# Patient Record
Sex: Female | Born: 1960 | Race: White | Hispanic: No | Smoking: Current every day smoker
Health system: Southern US, Community
[De-identification: ages and names within clinical notes are randomized; demographics above are authoritative.]

## PROBLEM LIST (undated history)

## (undated) DIAGNOSIS — F1410 Cocaine abuse, uncomplicated: Secondary | ICD-10-CM

## (undated) DIAGNOSIS — F329 Major depressive disorder, single episode, unspecified: Secondary | ICD-10-CM

## (undated) DIAGNOSIS — E119 Type 2 diabetes mellitus without complications: Secondary | ICD-10-CM

## (undated) DIAGNOSIS — IMO0001 Reserved for inherently not codable concepts without codable children: Secondary | ICD-10-CM

## (undated) DIAGNOSIS — E785 Hyperlipidemia, unspecified: Secondary | ICD-10-CM

## (undated) DIAGNOSIS — I2510 Atherosclerotic heart disease of native coronary artery without angina pectoris: Secondary | ICD-10-CM

## (undated) DIAGNOSIS — L02511 Cutaneous abscess of right hand: Secondary | ICD-10-CM

## (undated) DIAGNOSIS — Z9119 Patient's noncompliance with other medical treatment and regimen: Secondary | ICD-10-CM

## (undated) DIAGNOSIS — Z794 Long term (current) use of insulin: Secondary | ICD-10-CM

## (undated) DIAGNOSIS — R45851 Suicidal ideations: Secondary | ICD-10-CM

## (undated) DIAGNOSIS — I10 Essential (primary) hypertension: Secondary | ICD-10-CM

## (undated) DIAGNOSIS — E669 Obesity, unspecified: Secondary | ICD-10-CM

## (undated) DIAGNOSIS — Z91199 Patient's noncompliance with other medical treatment and regimen due to unspecified reason: Secondary | ICD-10-CM

## (undated) HISTORY — PX: ABDOMINAL HYSTERECTOMY: SHX81

## (undated) HISTORY — PX: APPENDECTOMY: SHX54

## (undated) HISTORY — PX: CHOLECYSTECTOMY: SHX55

---

## 2000-12-24 ENCOUNTER — Emergency Department (HOSPITAL_COMMUNITY): Admission: EM | Admit: 2000-12-24 | Discharge: 2000-12-24 | Payer: Self-pay | Admitting: Emergency Medicine

## 2000-12-24 ENCOUNTER — Encounter: Payer: Self-pay | Admitting: Emergency Medicine

## 2002-01-18 ENCOUNTER — Emergency Department (HOSPITAL_COMMUNITY): Admission: EM | Admit: 2002-01-18 | Discharge: 2002-01-18 | Payer: Self-pay | Admitting: Emergency Medicine

## 2002-01-18 ENCOUNTER — Encounter: Payer: Self-pay | Admitting: Emergency Medicine

## 2002-03-12 ENCOUNTER — Emergency Department (HOSPITAL_COMMUNITY): Admission: EM | Admit: 2002-03-12 | Discharge: 2002-03-12 | Payer: Self-pay | Admitting: Emergency Medicine

## 2002-03-12 ENCOUNTER — Encounter: Payer: Self-pay | Admitting: Emergency Medicine

## 2007-05-03 ENCOUNTER — Emergency Department (HOSPITAL_COMMUNITY): Admission: EM | Admit: 2007-05-03 | Discharge: 2007-05-03 | Payer: Self-pay | Admitting: Emergency Medicine

## 2007-05-14 ENCOUNTER — Emergency Department (HOSPITAL_COMMUNITY): Admission: EM | Admit: 2007-05-14 | Discharge: 2007-05-14 | Payer: Self-pay | Admitting: Emergency Medicine

## 2008-10-08 ENCOUNTER — Emergency Department (HOSPITAL_COMMUNITY): Admission: EM | Admit: 2008-10-08 | Discharge: 2008-10-08 | Payer: Self-pay | Admitting: Emergency Medicine

## 2008-12-16 ENCOUNTER — Ambulatory Visit: Payer: Self-pay | Admitting: Cardiovascular Disease

## 2008-12-17 ENCOUNTER — Inpatient Hospital Stay (HOSPITAL_COMMUNITY): Admission: EM | Admit: 2008-12-17 | Discharge: 2008-12-20 | Payer: Self-pay | Admitting: Emergency Medicine

## 2009-04-15 ENCOUNTER — Inpatient Hospital Stay (HOSPITAL_COMMUNITY): Admission: EM | Admit: 2009-04-15 | Discharge: 2009-04-16 | Payer: Self-pay | Admitting: Emergency Medicine

## 2010-06-10 LAB — GLUCOSE, CAPILLARY
Glucose-Capillary: 132 mg/dL — ABNORMAL HIGH (ref 70–99)
Glucose-Capillary: 189 mg/dL — ABNORMAL HIGH (ref 70–99)
Glucose-Capillary: 193 mg/dL — ABNORMAL HIGH (ref 70–99)
Glucose-Capillary: 194 mg/dL — ABNORMAL HIGH (ref 70–99)
Glucose-Capillary: 242 mg/dL — ABNORMAL HIGH (ref 70–99)
Glucose-Capillary: 249 mg/dL — ABNORMAL HIGH (ref 70–99)
Glucose-Capillary: 297 mg/dL — ABNORMAL HIGH (ref 70–99)
Glucose-Capillary: 301 mg/dL — ABNORMAL HIGH (ref 70–99)
Glucose-Capillary: 313 mg/dL — ABNORMAL HIGH (ref 70–99)
Glucose-Capillary: 315 mg/dL — ABNORMAL HIGH (ref 70–99)
Glucose-Capillary: 326 mg/dL — ABNORMAL HIGH (ref 70–99)
Glucose-Capillary: 389 mg/dL — ABNORMAL HIGH (ref 70–99)

## 2010-06-10 LAB — CBC
HCT: 41.6 % (ref 36.0–46.0)
HCT: 42 % (ref 36.0–46.0)
Hemoglobin: 14.5 g/dL (ref 12.0–15.0)
MCHC: 34.5 g/dL (ref 30.0–36.0)
MCHC: 34.5 g/dL (ref 30.0–36.0)
MCV: 83.3 fL (ref 78.0–100.0)
MCV: 83.4 fL (ref 78.0–100.0)
Platelets: 147 10*3/uL — ABNORMAL LOW (ref 150–400)
Platelets: 158 10*3/uL (ref 150–400)
RDW: 15.1 % (ref 11.5–15.5)

## 2010-06-10 LAB — RAPID URINE DRUG SCREEN, HOSP PERFORMED
Barbiturates: NOT DETECTED
Benzodiazepines: POSITIVE — AB
Cocaine: POSITIVE — AB
Opiates: POSITIVE — AB

## 2010-06-10 LAB — POCT CARDIAC MARKERS
CKMB, poc: 7.2 ng/mL (ref 1.0–8.0)
Myoglobin, poc: 77.4 ng/mL (ref 12–200)
Troponin i, poc: <0.05 ng/mL (ref 0.00–0.09)

## 2010-06-10 LAB — DIFFERENTIAL
Basophils Absolute: 0 10*3/uL (ref 0.0–0.1)
Eosinophils Relative: 1 % (ref 0–5)
Lymphocytes Relative: 27 % (ref 12–46)
Lymphs Abs: 2.5 10*3/uL (ref 0.7–4.0)
Neutro Abs: 6.2 10*3/uL (ref 1.7–7.7)

## 2010-06-10 LAB — APTT: aPTT: 25 seconds (ref 24–37)

## 2010-06-10 LAB — COMPREHENSIVE METABOLIC PANEL
AST: 27 U/L (ref 0–37)
Albumin: 3.5 g/dL (ref 3.5–5.2)
Albumin: 3.5 g/dL (ref 3.5–5.2)
Alkaline Phosphatase: 98 U/L (ref 39–117)
BUN: 9 mg/dL (ref 6–23)
BUN: 9 mg/dL (ref 6–23)
CO2: 29 mEq/L (ref 19–32)
Calcium: 9.7 mg/dL (ref 8.4–10.5)
Chloride: 101 mEq/L (ref 96–112)
Creatinine, Ser: 0.74 mg/dL (ref 0.4–1.2)
Creatinine, Ser: 0.77 mg/dL (ref 0.4–1.2)
GFR calc Af Amer: >60 mL/min (ref >60)
GFR calc non Af Amer: >60 mL/min (ref >60)
GFR calc non Af Amer: >60 mL/min (ref >60)
Glucose, Bld: 231 mg/dL — ABNORMAL HIGH (ref 70–99)
Potassium: 3.7 mEq/L (ref 3.5–5.1)
Total Bilirubin: 0.5 mg/dL (ref 0.3–1.2)
Total Bilirubin: 0.6 mg/dL (ref 0.3–1.2)

## 2010-06-10 LAB — PROTIME-INR: Prothrombin Time: 11 seconds — ABNORMAL LOW (ref 11.6–15.2)

## 2010-06-10 LAB — CARDIAC PANEL(CRET KIN+CKTOT+MB+TROPI)
CK, MB: 5.1 ng/mL — ABNORMAL HIGH (ref 0.3–4.0)
Relative Index: 3.8 — ABNORMAL HIGH (ref 0.0–2.5)
Troponin I: 0.01 ng/mL (ref 0.00–0.06)
Troponin I: 0.02 ng/mL (ref 0.00–0.06)

## 2010-06-10 LAB — MAGNESIUM: Magnesium: 2 mg/dL (ref 1.5–2.5)

## 2010-06-10 LAB — LIPID PANEL
Cholesterol: 320 mg/dL — ABNORMAL HIGH (ref 0–200)
HDL: 39 mg/dL — ABNORMAL LOW (ref >39)

## 2010-06-10 LAB — HEMOGLOBIN A1C
Hgb A1c MFr Bld: 10.1 % — ABNORMAL HIGH (ref 4.6–6.1)
Mean Plasma Glucose: 243 mg/dL

## 2010-06-10 LAB — PHOSPHORUS: Phosphorus: 3.5 mg/dL (ref 2.3–4.6)

## 2010-06-10 LAB — LIPASE, BLOOD: Lipase: 25 U/L (ref 11–59)

## 2010-06-29 LAB — RAPID URINE DRUG SCREEN, HOSP PERFORMED
Amphetamines: NOT DETECTED
Opiates: POSITIVE — AB
Tetrahydrocannabinol: NOT DETECTED

## 2010-06-29 LAB — COMPREHENSIVE METABOLIC PANEL
ALT: 15 U/L (ref 0–35)
AST: 16 U/L (ref 0–37)
Alkaline Phosphatase: 89 U/L (ref 39–117)
CO2: 33 mEq/L — ABNORMAL HIGH (ref 19–32)
Chloride: 99 mEq/L (ref 96–112)
GFR calc Af Amer: >60 mL/min (ref >60)
GFR calc non Af Amer: >60 mL/min (ref >60)
Glucose, Bld: 199 mg/dL — ABNORMAL HIGH (ref 70–99)
Potassium: 4.1 mEq/L (ref 3.5–5.1)
Sodium: 136 mEq/L (ref 135–145)
Total Bilirubin: 0.6 mg/dL (ref 0.3–1.2)

## 2010-06-29 LAB — VALPROIC ACID LEVEL: Valproic Acid Lvl: <10 ug/mL — ABNORMAL LOW (ref 50.0–100.0)

## 2010-06-29 LAB — URINALYSIS, MICROSCOPIC ONLY
Bilirubin Urine: NEGATIVE
Glucose, UA: NEGATIVE mg/dL
Hgb urine dipstick: NEGATIVE
Nitrite: NEGATIVE
Specific Gravity, Urine: 1.031 — ABNORMAL HIGH (ref 1.005–1.030)
pH: 5 (ref 5.0–8.0)

## 2010-06-29 LAB — DIFFERENTIAL
Basophils Relative: 0 % (ref 0–1)
Eosinophils Absolute: 0.2 10*3/uL (ref 0.0–0.7)
Eosinophils Relative: 1 % (ref 0–5)
Lymphs Abs: 3.2 10*3/uL (ref 0.7–4.0)
Neutrophils Relative %: 67 % (ref 43–77)

## 2010-06-29 LAB — LIPASE, BLOOD: Lipase: 17 U/L (ref 11–59)

## 2010-06-29 LAB — GLUCOSE, CAPILLARY
Glucose-Capillary: 112 mg/dL — ABNORMAL HIGH (ref 70–99)
Glucose-Capillary: 175 mg/dL — ABNORMAL HIGH (ref 70–99)
Glucose-Capillary: 228 mg/dL — ABNORMAL HIGH (ref 70–99)
Glucose-Capillary: 248 mg/dL — ABNORMAL HIGH (ref 70–99)
Glucose-Capillary: 250 mg/dL — ABNORMAL HIGH (ref 70–99)
Glucose-Capillary: 275 mg/dL — ABNORMAL HIGH (ref 70–99)
Glucose-Capillary: 442 mg/dL — ABNORMAL HIGH (ref 70–99)

## 2010-06-29 LAB — CBC
HCT: 36.8 % (ref 36.0–46.0)
Hemoglobin: 13 g/dL (ref 12.0–15.0)
Hemoglobin: 13 g/dL (ref 12.0–15.0)
Hemoglobin: 13.1 g/dL (ref 12.0–15.0)
MCHC: 34.1 g/dL (ref 30.0–36.0)
MCV: 85 fL (ref 78.0–100.0)
Platelets: 147 10*3/uL — ABNORMAL LOW (ref 150–400)
RBC: 4.48 MIL/uL (ref 3.87–5.11)
RBC: 4.53 MIL/uL (ref 3.87–5.11)
RBC: 4.55 MIL/uL (ref 3.87–5.11)
RDW: 13.3 % (ref 11.5–15.5)
WBC: 11.5 10*3/uL — ABNORMAL HIGH (ref 4.0–10.5)
WBC: 12.1 10*3/uL — ABNORMAL HIGH (ref 4.0–10.5)

## 2010-06-29 LAB — BASIC METABOLIC PANEL
CO2: 28 mEq/L (ref 19–32)
Calcium: 8.2 mg/dL — ABNORMAL LOW (ref 8.4–10.5)
Calcium: 9.5 mg/dL (ref 8.4–10.5)
Chloride: 96 mEq/L (ref 96–112)
Creatinine, Ser: 0.56 mg/dL (ref 0.4–1.2)
GFR calc Af Amer: >60 mL/min (ref >60)
GFR calc Af Amer: >60 mL/min (ref >60)
GFR calc non Af Amer: >60 mL/min (ref >60)
Potassium: 4.7 mEq/L (ref 3.5–5.1)
Sodium: 134 mEq/L — ABNORMAL LOW (ref 135–145)
Sodium: 135 mEq/L (ref 135–145)

## 2010-06-29 LAB — URINE CULTURE

## 2010-06-29 LAB — CARDIAC PANEL(CRET KIN+CKTOT+MB+TROPI)
Relative Index: INVALID (ref 0.0–2.5)
Relative Index: INVALID (ref 0.0–2.5)
Total CK: 67 U/L (ref 7–177)
Troponin I: 0.08 ng/mL — ABNORMAL HIGH (ref 0.00–0.06)

## 2010-06-29 LAB — PROTIME-INR
INR: 0.8 (ref 0.00–1.49)
Prothrombin Time: 11.3 seconds — ABNORMAL LOW (ref 11.6–15.2)

## 2010-06-29 LAB — LIPID PANEL
HDL: 46 mg/dL (ref >39)
Total CHOL/HDL Ratio: 4.4 RATIO
VLDL: 39 mg/dL (ref 0–40)

## 2010-06-29 LAB — HEMOGLOBIN A1C
Hgb A1c MFr Bld: 10.2 % — ABNORMAL HIGH (ref 4.6–6.1)
Mean Plasma Glucose: 246 mg/dL

## 2010-06-29 LAB — TSH: TSH: 1.264 u[IU]/mL (ref 0.350–4.500)

## 2010-06-29 LAB — POCT CARDIAC MARKERS: CKMB, poc: 1.4 ng/mL (ref 1.0–8.0)

## 2010-07-01 LAB — URINALYSIS, ROUTINE W REFLEX MICROSCOPIC
Glucose, UA: 500 mg/dL — AB
Ketones, ur: 15 mg/dL — AB
Protein, ur: 30 mg/dL — AB

## 2010-07-01 LAB — GLUCOSE, CAPILLARY: Glucose-Capillary: 256 mg/dL — ABNORMAL HIGH (ref 70–99)

## 2010-07-01 LAB — URINE MICROSCOPIC-ADD ON

## 2014-05-23 ENCOUNTER — Other Ambulatory Visit: Payer: Self-pay

## 2014-05-23 ENCOUNTER — Encounter (HOSPITAL_COMMUNITY)
Admission: EM | Disposition: A | Discharge status: Other Acute Inpatient Hospital | Payer: Self-pay | Source: Other Acute Inpatient Hospital | Attending: Cardiovascular Disease

## 2014-05-23 ENCOUNTER — Inpatient Hospital Stay (HOSPITAL_COMMUNITY)
Admission: EM | Admit: 2014-05-23 | Discharge: 2014-05-31 | DRG: 287 | Disposition: A | Discharge status: Other Acute Inpatient Hospital | Payer: Medicaid - Out of State | Source: Other Acute Inpatient Hospital | Attending: Cardiovascular Disease | Admitting: Cardiovascular Disease

## 2014-05-23 ENCOUNTER — Encounter (HOSPITAL_COMMUNITY): Payer: Self-pay | Admitting: Cardiology

## 2014-05-23 DIAGNOSIS — E785 Hyperlipidemia, unspecified: Secondary | ICD-10-CM | POA: Diagnosis present

## 2014-05-23 DIAGNOSIS — I447 Left bundle-branch block, unspecified: Secondary | ICD-10-CM | POA: Diagnosis present

## 2014-05-23 DIAGNOSIS — E871 Hypo-osmolality and hyponatremia: Secondary | ICD-10-CM | POA: Diagnosis present

## 2014-05-23 DIAGNOSIS — L02511 Cutaneous abscess of right hand: Secondary | ICD-10-CM | POA: Diagnosis present

## 2014-05-23 DIAGNOSIS — D696 Thrombocytopenia, unspecified: Secondary | ICD-10-CM | POA: Diagnosis present

## 2014-05-23 DIAGNOSIS — F332 Major depressive disorder, recurrent severe without psychotic features: Secondary | ICD-10-CM | POA: Diagnosis present

## 2014-05-23 DIAGNOSIS — Z6827 Body mass index (BMI) 27.0-27.9, adult: Secondary | ICD-10-CM | POA: Diagnosis not present

## 2014-05-23 DIAGNOSIS — G629 Polyneuropathy, unspecified: Secondary | ICD-10-CM | POA: Diagnosis present

## 2014-05-23 DIAGNOSIS — F17210 Nicotine dependence, cigarettes, uncomplicated: Secondary | ICD-10-CM | POA: Diagnosis present

## 2014-05-23 DIAGNOSIS — I249 Acute ischemic heart disease, unspecified: Secondary | ICD-10-CM | POA: Diagnosis not present

## 2014-05-23 DIAGNOSIS — G894 Chronic pain syndrome: Secondary | ICD-10-CM

## 2014-05-23 DIAGNOSIS — I10 Essential (primary) hypertension: Secondary | ICD-10-CM | POA: Diagnosis present

## 2014-05-23 DIAGNOSIS — E669 Obesity, unspecified: Secondary | ICD-10-CM | POA: Diagnosis present

## 2014-05-23 DIAGNOSIS — Z794 Long term (current) use of insulin: Secondary | ICD-10-CM | POA: Diagnosis not present

## 2014-05-23 DIAGNOSIS — F1410 Cocaine abuse, uncomplicated: Secondary | ICD-10-CM | POA: Diagnosis present

## 2014-05-23 DIAGNOSIS — Z91199 Patient's noncompliance with other medical treatment and regimen due to unspecified reason: Secondary | ICD-10-CM

## 2014-05-23 DIAGNOSIS — I25111 Atherosclerotic heart disease of native coronary artery with angina pectoris with documented spasm: Secondary | ICD-10-CM

## 2014-05-23 DIAGNOSIS — Z720 Tobacco use: Secondary | ICD-10-CM | POA: Diagnosis present

## 2014-05-23 DIAGNOSIS — Z9119 Patient's noncompliance with other medical treatment and regimen: Secondary | ICD-10-CM | POA: Diagnosis present

## 2014-05-23 DIAGNOSIS — I25110 Atherosclerotic heart disease of native coronary artery with unstable angina pectoris: Secondary | ICD-10-CM | POA: Diagnosis not present

## 2014-05-23 DIAGNOSIS — E119 Type 2 diabetes mellitus without complications: Secondary | ICD-10-CM

## 2014-05-23 DIAGNOSIS — J449 Chronic obstructive pulmonary disease, unspecified: Secondary | ICD-10-CM | POA: Diagnosis present

## 2014-05-23 DIAGNOSIS — R440 Auditory hallucinations: Secondary | ICD-10-CM | POA: Diagnosis present

## 2014-05-23 DIAGNOSIS — F333 Major depressive disorder, recurrent, severe with psychotic symptoms: Secondary | ICD-10-CM

## 2014-05-23 DIAGNOSIS — I25118 Atherosclerotic heart disease of native coronary artery with other forms of angina pectoris: Secondary | ICD-10-CM | POA: Diagnosis not present

## 2014-05-23 DIAGNOSIS — I2510 Atherosclerotic heart disease of native coronary artery without angina pectoris: Secondary | ICD-10-CM | POA: Diagnosis present

## 2014-05-23 DIAGNOSIS — R45851 Suicidal ideations: Secondary | ICD-10-CM | POA: Diagnosis not present

## 2014-05-23 DIAGNOSIS — R000 Tachycardia, unspecified: Secondary | ICD-10-CM | POA: Diagnosis present

## 2014-05-23 DIAGNOSIS — J438 Other emphysema: Secondary | ICD-10-CM | POA: Diagnosis not present

## 2014-05-23 DIAGNOSIS — R079 Chest pain, unspecified: Secondary | ICD-10-CM | POA: Diagnosis present

## 2014-05-23 HISTORY — DX: Obesity, unspecified: E66.9

## 2014-05-23 HISTORY — PX: LEFT HEART CATHETERIZATION WITH CORONARY ANGIOGRAM: SHX5451

## 2014-05-23 HISTORY — DX: Hyperlipidemia, unspecified: E78.5

## 2014-05-23 HISTORY — DX: Patient's noncompliance with other medical treatment and regimen: Z91.19

## 2014-05-23 HISTORY — DX: Essential (primary) hypertension: I10

## 2014-05-23 HISTORY — DX: Long term (current) use of insulin: Z79.4

## 2014-05-23 HISTORY — DX: Atherosclerotic heart disease of native coronary artery without angina pectoris: I25.10

## 2014-05-23 HISTORY — DX: Major depressive disorder, single episode, unspecified: F32.9

## 2014-05-23 HISTORY — DX: Reserved for inherently not codable concepts without codable children: IMO0001

## 2014-05-23 HISTORY — DX: Cocaine abuse, uncomplicated: F14.10

## 2014-05-23 HISTORY — DX: Suicidal ideations: R45.851

## 2014-05-23 HISTORY — DX: Type 2 diabetes mellitus without complications: E11.9

## 2014-05-23 HISTORY — DX: Cutaneous abscess of right hand: L02.511

## 2014-05-23 HISTORY — DX: Patient's noncompliance with other medical treatment and regimen due to unspecified reason: Z91.199

## 2014-05-23 LAB — RAPID URINE DRUG SCREEN, HOSP PERFORMED
Amphetamines: NOT DETECTED
BARBITURATES: NOT DETECTED
Benzodiazepines: POSITIVE — AB
COCAINE: POSITIVE — AB
OPIATES: POSITIVE — AB
Tetrahydrocannabinol: NOT DETECTED

## 2014-05-23 LAB — TROPONIN I
Troponin I: 0.03 ng/mL (ref <0.031)
Troponin I: 0.03 ng/mL (ref <0.031)
Troponin I: 0.1 ng/mL — ABNORMAL HIGH (ref <0.031)

## 2014-05-23 LAB — GLUCOSE, CAPILLARY
GLUCOSE-CAPILLARY: 403 mg/dL — AB (ref 70–99)
GLUCOSE-CAPILLARY: 219 mg/dL — AB (ref 70–99)
Glucose-Capillary: 316 mg/dL — ABNORMAL HIGH (ref 70–99)
Glucose-Capillary: 87 mg/dL (ref 70–99)
Glucose-Capillary: 261 mg/dL — ABNORMAL HIGH (ref 70–99)

## 2014-05-23 LAB — COMPREHENSIVE METABOLIC PANEL
ALT: 9 U/L (ref 0–35)
ANION GAP: 5 (ref 5–15)
AST: 15 U/L (ref 0–37)
Albumin: 3.4 g/dL — ABNORMAL LOW (ref 3.5–5.2)
Alkaline Phosphatase: 92 U/L (ref 39–117)
BUN: 10 mg/dL (ref 6–23)
CALCIUM: 9.4 mg/dL (ref 8.4–10.5)
CO2: 27 mmol/L (ref 19–32)
Chloride: 100 mmol/L (ref 96–112)
Creatinine, Ser: 0.77 mg/dL (ref 0.50–1.10)
Glucose, Bld: 429 mg/dL — ABNORMAL HIGH (ref 70–99)
Potassium: 4.1 mmol/L (ref 3.5–5.1)
Sodium: 132 mmol/L — ABNORMAL LOW (ref 135–145)
TOTAL PROTEIN: 7 g/dL (ref 6.0–8.3)
Total Bilirubin: 0.2 mg/dL — ABNORMAL LOW (ref 0.3–1.2)

## 2014-05-23 LAB — HEPARIN LEVEL (UNFRACTIONATED): HEPARIN UNFRACTIONATED: 0.2 [IU]/mL — AB (ref 0.30–0.70)

## 2014-05-23 LAB — MRSA PCR SCREENING: MRSA BY PCR: NEGATIVE

## 2014-05-23 LAB — TSH: TSH: 1.22 u[IU]/mL (ref 0.350–4.500)

## 2014-05-23 SURGERY — LEFT HEART CATHETERIZATION WITH CORONARY ANGIOGRAM
Anesthesia: LOCAL

## 2014-05-23 MED ORDER — CEPHALEXIN 500 MG PO CAPS
500.0000 mg | ORAL_CAPSULE | Freq: Four times a day (QID) | ORAL | Status: DC
  Administered 2014-05-23 – 2014-05-25 (×8): 500 mg via ORAL
  Filled 2014-05-23 (×13): qty 1

## 2014-05-23 MED ORDER — VERAPAMIL HCL 2.5 MG/ML IV SOLN
INTRAVENOUS | Status: AC
  Filled 2014-05-23: qty 2

## 2014-05-23 MED ORDER — SODIUM CHLORIDE 0.9 % IV SOLN
INTRAVENOUS | Status: AC
  Administered 2014-05-23: 13:00:00 via INTRAVENOUS
  Administered 2014-05-23: 150 mL/h via INTRAVENOUS

## 2014-05-23 MED ORDER — ATORVASTATIN CALCIUM 80 MG PO TABS
80.0000 mg | ORAL_TABLET | Freq: Every day | ORAL | Status: DC
  Administered 2014-05-23 – 2014-05-30 (×7): 80 mg via ORAL
  Filled 2014-05-23 (×9): qty 1

## 2014-05-23 MED ORDER — ALPRAZOLAM 0.25 MG PO TABS
0.2500 mg | ORAL_TABLET | Freq: Three times a day (TID) | ORAL | Status: DC | PRN
  Administered 2014-05-23: 0.25 mg via ORAL
  Filled 2014-05-23 (×2): qty 1

## 2014-05-23 MED ORDER — LIDOCAINE HCL (PF) 1 % IJ SOLN
INTRAMUSCULAR | Status: AC
  Filled 2014-05-23: qty 30

## 2014-05-23 MED ORDER — INSULIN GLARGINE 100 UNIT/ML ~~LOC~~ SOLN
40.0000 [IU] | Freq: Every day | SUBCUTANEOUS | Status: DC
  Administered 2014-05-23: 40 [IU] via SUBCUTANEOUS
  Filled 2014-05-23 (×2): qty 0.4

## 2014-05-23 MED ORDER — HEPARIN (PORCINE) IN NACL 2-0.9 UNIT/ML-% IJ SOLN
INTRAMUSCULAR | Status: AC
  Filled 2014-05-23: qty 1500

## 2014-05-23 MED ORDER — AMLODIPINE BESYLATE 5 MG PO TABS
5.0000 mg | ORAL_TABLET | Freq: Every day | ORAL | Status: DC
  Administered 2014-05-23 – 2014-05-25 (×2): 5 mg via ORAL
  Filled 2014-05-23 (×3): qty 1

## 2014-05-23 MED ORDER — NICOTINE 21 MG/24HR TD PT24
21.0000 mg | MEDICATED_PATCH | Freq: Every day | TRANSDERMAL | Status: DC
  Administered 2014-05-24 – 2014-05-31 (×9): 21 mg via TRANSDERMAL
  Filled 2014-05-23 (×9): qty 1

## 2014-05-23 MED ORDER — INSULIN ASPART 100 UNIT/ML ~~LOC~~ SOLN
0.0000 [IU] | Freq: Three times a day (TID) | SUBCUTANEOUS | Status: DC
  Administered 2014-05-23: 7 [IU] via SUBCUTANEOUS
  Administered 2014-05-23: 15 [IU] via SUBCUTANEOUS
  Administered 2014-05-24: 12 [IU] via SUBCUTANEOUS
  Administered 2014-05-24: 11 [IU] via SUBCUTANEOUS
  Administered 2014-05-24 – 2014-05-25 (×2): 15 [IU] via SUBCUTANEOUS
  Administered 2014-05-26: 7 [IU] via SUBCUTANEOUS
  Administered 2014-05-26: 4 [IU] via SUBCUTANEOUS
  Administered 2014-05-26: 7 [IU] via SUBCUTANEOUS
  Administered 2014-05-27: 11 [IU] via SUBCUTANEOUS
  Administered 2014-05-27: 3 [IU] via SUBCUTANEOUS
  Administered 2014-05-28: 7 [IU] via SUBCUTANEOUS
  Administered 2014-05-29: 4 [IU] via SUBCUTANEOUS
  Administered 2014-05-29: 3 [IU] via SUBCUTANEOUS
  Administered 2014-05-30: 7 [IU] via SUBCUTANEOUS
  Administered 2014-05-30 – 2014-05-31 (×2): 4 [IU] via SUBCUTANEOUS
  Administered 2014-05-31: 3 [IU] via SUBCUTANEOUS

## 2014-05-23 MED ORDER — NITROGLYCERIN 1 MG/10 ML FOR IR/CATH LAB
INTRA_ARTERIAL | Status: AC
  Filled 2014-05-23: qty 10

## 2014-05-23 MED ORDER — SODIUM CHLORIDE 0.9 % IV SOLN: 250.0000 mL | INTRAVENOUS | Status: DC | PRN

## 2014-05-23 MED ORDER — HYDROMORPHONE HCL 1 MG/ML IJ SOLN
1.0000 mg | INTRAMUSCULAR | Status: DC | PRN
  Administered 2014-05-23 – 2014-05-24 (×3): 2 mg via INTRAVENOUS
  Filled 2014-05-23 (×2): qty 2
  Filled 2014-05-23: qty 1
  Filled 2014-05-23: qty 2

## 2014-05-23 MED ORDER — HEPARIN (PORCINE) IN NACL 100-0.45 UNIT/ML-% IJ SOLN
1000.0000 [IU]/h | INTRAMUSCULAR | Status: DC
  Administered 2014-05-23: 1000 [IU]/h via INTRAVENOUS
  Filled 2014-05-23: qty 250

## 2014-05-23 MED ORDER — MIDAZOLAM HCL 2 MG/2ML IJ SOLN
INTRAMUSCULAR | Status: AC
  Filled 2014-05-23: qty 2

## 2014-05-23 MED ORDER — PNEUMOCOCCAL VAC POLYVALENT 25 MCG/0.5ML IJ INJ
0.5000 mL | INJECTION | INTRAMUSCULAR | Status: AC
  Administered 2014-05-24: 0.5 mL via INTRAMUSCULAR
  Filled 2014-05-23: qty 0.5

## 2014-05-23 MED ORDER — ASPIRIN EC 81 MG PO TBEC
81.0000 mg | DELAYED_RELEASE_TABLET | Freq: Every day | ORAL | Status: DC
  Administered 2014-05-24 – 2014-05-31 (×8): 81 mg via ORAL
  Filled 2014-05-23 (×8): qty 1

## 2014-05-23 MED ORDER — MORPHINE SULFATE 2 MG/ML IJ SOLN: 1.0000 mg | INTRAMUSCULAR | Status: DC | PRN

## 2014-05-23 MED ORDER — HEPARIN (PORCINE) IN NACL 100-0.45 UNIT/ML-% IJ SOLN
1200.0000 [IU]/h | INTRAMUSCULAR | Status: DC
  Administered 2014-05-23: 1200 [IU]/h via INTRAVENOUS
  Filled 2014-05-23: qty 250

## 2014-05-23 MED ORDER — HYDROMORPHONE HCL 1 MG/ML IJ SOLN
1.0000 mg | INTRAMUSCULAR | Status: AC | PRN
  Administered 2014-05-23: 2 mg via INTRAVENOUS
  Administered 2014-05-23: 1 mg via INTRAVENOUS
  Administered 2014-05-23: 2 mg via INTRAVENOUS
  Administered 2014-05-23: 1 mg via INTRAVENOUS
  Filled 2014-05-23: qty 1
  Filled 2014-05-23 (×2): qty 2
  Filled 2014-05-23: qty 1

## 2014-05-23 MED ORDER — DOPAMINE-DEXTROSE 3.2-5 MG/ML-% IV SOLN
2.5000 ug/kg/min | INTRAVENOUS | Status: DC
  Administered 2014-05-23: 2.5 ug/kg/min via INTRAVENOUS
  Administered 2014-05-24: 12 ug/kg/min via INTRAVENOUS
  Filled 2014-05-23: qty 250

## 2014-05-23 MED ORDER — MORPHINE SULFATE 2 MG/ML IJ SOLN
2.0000 mg | INTRAMUSCULAR | Status: DC | PRN
  Administered 2014-05-23: 4 mg via INTRAVENOUS
  Filled 2014-05-23: qty 2

## 2014-05-23 MED ORDER — GABAPENTIN 600 MG PO TABS
600.0000 mg | ORAL_TABLET | Freq: Two times a day (BID) | ORAL | Status: DC
  Administered 2014-05-23: 600 mg via ORAL
  Filled 2014-05-23 (×3): qty 1

## 2014-05-23 MED ORDER — ONDANSETRON HCL 4 MG/2ML IJ SOLN
4.0000 mg | Freq: Four times a day (QID) | INTRAMUSCULAR | Status: DC | PRN
Start: 2014-05-23 — End: 2014-05-23

## 2014-05-23 MED ORDER — NITROGLYCERIN 0.4 MG SL SUBL
0.4000 mg | SUBLINGUAL_TABLET | SUBLINGUAL | Status: DC | PRN
  Filled 2014-05-23: qty 1

## 2014-05-23 MED ORDER — ONDANSETRON HCL 4 MG/2ML IJ SOLN
INTRAMUSCULAR | Status: AC
  Filled 2014-05-23: qty 2

## 2014-05-23 MED ORDER — ACETAMINOPHEN 325 MG PO TABS
650.0000 mg | ORAL_TABLET | ORAL | Status: DC | PRN
  Administered 2014-05-27 – 2014-05-29 (×3): 650 mg via ORAL
  Filled 2014-05-23 (×3): qty 2

## 2014-05-23 MED ORDER — ONDANSETRON HCL 4 MG/2ML IJ SOLN
4.0000 mg | Freq: Four times a day (QID) | INTRAMUSCULAR | Status: DC | PRN
  Administered 2014-05-23 – 2014-05-24 (×4): 4 mg via INTRAVENOUS
  Filled 2014-05-23 (×5): qty 2

## 2014-05-23 MED ORDER — MORPHINE BOLUS VIA INFUSION: 2.0000 mg | INTRAVENOUS | Status: DC | PRN

## 2014-05-23 MED ORDER — ALPRAZOLAM 0.5 MG PO TABS
1.0000 mg | ORAL_TABLET | Freq: Two times a day (BID) | ORAL | Status: DC | PRN
  Administered 2014-05-23: 1 mg via ORAL
  Filled 2014-05-23 (×2): qty 2

## 2014-05-23 MED ORDER — SODIUM CHLORIDE 0.9 % IJ SOLN
10.0000 mL | INTRAMUSCULAR | Status: DC | PRN
  Administered 2014-05-29 – 2014-05-30 (×3): 30 mL
  Administered 2014-05-31: 10 mL
  Filled 2014-05-23 (×4): qty 40

## 2014-05-23 MED ORDER — SODIUM CHLORIDE 0.9 % IJ SOLN
10.0000 mL | Freq: Two times a day (BID) | INTRAMUSCULAR | Status: DC
  Administered 2014-05-23 – 2014-05-26 (×6): 10 mL
  Administered 2014-05-26: 20 mL
  Administered 2014-05-27: 10 mL
  Administered 2014-05-27 – 2014-05-30 (×3): 30 mL

## 2014-05-23 MED ORDER — ASPIRIN 81 MG PO CHEW
81.0000 mg | CHEWABLE_TABLET | Freq: Every day | ORAL | Status: DC
Start: 2014-05-23 — End: 2014-05-23

## 2014-05-23 MED ORDER — SODIUM CHLORIDE 0.9 % IJ SOLN
3.0000 mL | Freq: Two times a day (BID) | INTRAMUSCULAR | Status: DC
Start: 2014-05-23 — End: 2014-05-23

## 2014-05-23 MED ORDER — ACETAMINOPHEN 325 MG PO TABS: 650.0000 mg | ORAL_TABLET | ORAL | Status: DC | PRN

## 2014-05-23 MED ORDER — SODIUM CHLORIDE 0.9 % IV SOLN
INTRAVENOUS | Status: DC
  Administered 2014-05-23: 09:00:00 via INTRAVENOUS
  Administered 2014-05-24: 75 mL/h via INTRAVENOUS
  Administered 2014-05-26: 16:00:00 via INTRAVENOUS

## 2014-05-23 MED ORDER — SODIUM CHLORIDE 0.9 % IJ SOLN: 3.0000 mL | INTRAMUSCULAR | Status: DC | PRN

## 2014-05-23 MED ORDER — HEPARIN SODIUM (PORCINE) 1000 UNIT/ML IJ SOLN
INTRAMUSCULAR | Status: AC
  Filled 2014-05-23: qty 1

## 2014-05-23 MED ORDER — ASPIRIN 81 MG PO CHEW
81.0000 mg | CHEWABLE_TABLET | ORAL | Status: AC
  Administered 2014-05-23: 81 mg via ORAL
  Filled 2014-05-23: qty 1

## 2014-05-23 NOTE — Progress Notes (Signed)
Chaplain attempted visit with Shirley Short. She was sleeping, will return later.   [name], [name], Chaplain 05/23/2014

## 2014-05-23 NOTE — H&P (Addendum)
History and Physical   Admit date: 05/23/2014 Name:  Shirley Short Medical record number: [id number] DOB/Age:  07/03/1960  54 y.o. female  [facility]  Chief complaint/reason for admission: Severe chest pain   HPI:  This 54 year old female has a history of insulin-dependent diabetes hypertension and hyperlipidemia.  She was seen here in 2010 with prolonged chest pain and had an EKG showing anterior ischemia.  She had used cocaine at the time and at catheterization was found to have evidence of coronary artery spasm and diffuse disease in the distal LAD.  At the time she had a codominant circumflex and distal disease involving the right coronary artery.  She also has a prior psychiatric disorder according to the old records and has continued to use cocaine and smoking heavily.  She evidently has a chronic pain syndrome and in the past he used Fentanyl patches although she states the present time that she is not using pain medicines.  She complains of episodic chest pain for some time.  She developed severe chest pain at midnight last night and had some sharp pains involving the left side of her neck.  [facility] where she refused nitroglycerin and had to have a central line inserted because of failure to obtain an IV peripherally.  After discussion with the emergency room doctor [facility].  After consultation with interventional cardiologist, the interventional cardiologist felt that she should have further evaluation prior to going directly to the cardiac catheterization laboratory.  On arrival here the patient was having ongoing chest discomfort although she did not appear to be acutely ill.  She was belligerent and refused intravenous nitroglycerin or sublingual nitroglycerin.  The patient states that she did use cocaine by snorting about 5 days ago evidently told the doctors in [place] that  it was 3 days ago.   Past Medical History  Diagnosis Date  . CAD (coronary artery disease), native coronary artery 05/23/2014  . Insulin dependent diabetes mellitus   . Noncompliance   . Obesity (BMI 30-39.9) 05/23/2014  . Hyperlipidemia 05/23/2014  . Hypertension   . Cocaine abuse 05/23/2014     Past Surgical History  Procedure Laterality Date  . Appendectomy    . Abdominal hysterectomy    . Cholecystectomy     Allergies: is allergic to compazine; avelox; and levaquin.   Medications: Prior to Admission medications   Not on File   Family History:  Family Status  Relation Status Death Age  . Father Deceased   . Mother Deceased     CAD MI age 54   Social History:   reports that she has been smoking Cigarettes.  She has a 45 pack-year smoking history. She has never used smokeless tobacco. She reports that she uses illicit drugs. She reports that she does not drink alcohol.   History   Social History Narrative  . No narrative on file     Review of Systems: Malaise and fatigue, chronic pain and fibromyalgia, peripheral neuropathy. Other than as noted above, the remainder of the review of systems is normal  Physical Exam: BP 173/100 mmHg  Resp 15  Ht 5\' 4"  (1.626 m)  Wt 73.5 kg (162 lb 0.6 oz)  BMI 27.80 kg/m2 General appearance: Obese, belligerent white female in no acute distress, did not appear acutely ill. Head: Normocephalic, without obvious abnormality, atraumatic Eyes: conjunctivae/corneas clear. PERRL, EOM's intact. Fundi not examined  Neck: no adenopathy, no carotid bruit, no JVD and supple, symmetrical, trachea midline Lungs: clear to auscultation bilaterally Heart: regular rate and rhythm, S1, S2 normal, no murmur, click, rub or gallop Abdomen: soft, non-tender; bowel sounds normal; no masses,  no organomegaly Extremities: erythema dorsum of right hand with swelling Pulses: Distal pulses are 1+, femoral pulses are palpable Skin: Heavily  tattooed Neurologic: Grossly normal  Labs: CBC WBC 10,500 hemoglobin 16.4 hematocrit 50 MCV 8270% neutrophils  Chemistry Sodium 131 potassium 4.0 chloride 96 CO2 25   Cardiac Panel (last 3 results) Troponin 0.02  Thyroid   Lab Results  Component Value Date   TSH 1.381  04/15/2009   EKG: Sinus rhythm with a left bundle branch block pattern  Radiology: Normal left basilar atelectasis on chest x-[facility] at [facility]   IMPRESSIONS: 1.  Prolonged chest pain ongoing in a patient with a left bundle branch block pattern 2.  Substance abuse with recent cocaine use 3.  Ongoing tobacco abuse 4.  Insulin-dependent diabetes mellitus 5.  Hypertension 6.  Hyperlipidemia 7.  Obesity  PLAN: Patient has ongoing chest pain at this time.  I had previously discussed with interventional cardiology who asked that further testing be done in light of the negative troponin.  She will have a repeat troponin and at the request of interventional cardiologist, I asked for a stat echocardiogram that is in the process of being done.  She will be given morphine for pain.  She currently refuses intravenous nitroglycerin.  Further evaluation will depend on the results of the echocardiogram.  Obtain urine drug screen and will not use beta blockers in light of possible recent cocaine use.  I suspect that in light of the new left bundle branch block and ongoing chest pain that she will require catheterization prior to discharge if it is not done acutely.  Signed: Darden Palmer MD  [facility]  05/23/2014, 5:23 AM

## 2014-05-23 NOTE — Progress Notes (Signed)
Echocardiogram 2D Echocardiogram has been performed.  Dorothey BasemanReel, Charvis Lightner M 05/23/2014, 5:30 AM

## 2014-05-23 NOTE — Progress Notes (Addendum)
Called by RN with EKG change.  It still appears to be ST with LBBB but at faster rate, with p waves on downslope of T wave.  Cath with disease that had increased, EF was 60% where her echo had an EF of 25-30%- along with LVH.  Pt has started with pain again but BP soft.  On dopamine which may be driving her tachycardia.  Will try and decrease.     Once we got her something to drink and had her call family she has settled down  Some.  May still sign out AMA.  Giving dilaudid for pain.  Will monitor BP.

## 2014-05-23 NOTE — Progress Notes (Signed)
Pt. Woke up after being drowsy. New agitation. Dr. Tresa EndoKelly notified of possible EKG changes. Paged to have NP come assess her. Pt states we are keeping her from talking to family. Pt has been sleeping until this point. Have explained to pt we do not have phones in ICU rooms. I will provide pt with a phone. Pt threatening to leave AMA in order to have a cigarette. Risks to this have been explained. Awaiting NP assessment before giving pain medication due to low bps.

## 2014-05-23 NOTE — CV Procedure (Addendum)
[name] is a 54 y.o. female    [id number] LOCATION:  FACILITY: [facility]  PHYSICIAN: Nanetta BattyJonathan Erling Arrazola, M.D. 05/27/1960   DATE OF PROCEDURE:  05/23/2014  DATE OF DISCHARGE:     CARDIAC CATHETERIZATION     History obtained from chart review. [name] if a 54 year old Caucasian  Female admitted in [facility] as a "[facility]I". She does have left bundle branch block. Her risk factors include COPD with continued tobacco abuse, diabetes and cocaine abuse. She had a heart catheterization performed by Dr. Donnie Ahoilley in 2010 which showed distal LAD and RCA disease. Local therapy was recommended. She has had continued chest pain with negative troponins. She presents now for diagnostic coronary arteriography.   PROCEDURE DESCRIPTION:   The patient was brought to the second floor [facility] cath lab in the postabsorptive state. She was premedicated with Dilaudid 2 mg in the CCU, IV Versed..  Her right wrist was prepped and shaved in usual sterile fashion. Xylocaine 1% was used for local anesthesia. A 6 French sheath was inserted into the right radial artery using standard Seldinger technique. The patient received 3500 units  of heparin  intravenously.  A 5 French TIG catheter, JR4 guide catheter and pigtail catheters were used for selective coronary angiography and left ventriculography respectively. Visipaque dye was used for the entirety of the case. Retrograde aortic, left ventricular pullback pressures were recorded. The patient did receive radial cocktail. Her systolic pressure was in the 60-70 mm . She did get IV fluid bolus as well as low-dose IV dopamine continuous infusion.    HEMODYNAMICS:    AO SYSTOLIC/AO DIASTOLIC: 72/36   LV SYSTOLIC/LV DIASTOLIC: 77/12  ANGIOGRAPHIC RESULTS:   1. Left main; normal  2. LAD; 70% smooth segmental distal. There was a high diagonal branch which was small in caliber that had 70-80% segmental mid stenosis 3. Left circumflex;  codominant and free of significant disease.  4. Right coronary artery; codominant with 75% segmental PLA stenosis. The PDA arose high in the RCA 5. Left ventriculography; RAO left ventriculogram was performed using  25 mL of Visipaque dye at 12 mL/second. The overall LVEF estimated  60 %  Without wall motion abnormalities  IMPRESSION:[name] has moderate distal LAD and RCA disease. These seem to have progressed since her last 6 years ago. Her systolic pressure was too low to administer intracoronary nitroglycerin to determine whether there was an element of spasm. I'm going to review the films with my colleagues prior to making a final determination with regards treatment options. The sheath was removed and a TR band was placed on the right wrist to achieve patent hemostasis. The patient left the cath lab in stable condition.  Runell GessBERRY,Danisha Brassfield J. MD, [facility] 05/23/2014 12:16 PM

## 2014-05-23 NOTE — Interval H&P Note (Signed)
Cath Lab Visit (complete for each Cath Lab visit)  Clinical Evaluation Leading to the Procedure:   ACS: Yes.    Non-ACS:    Anginal Classification: CCS IV  Anti-ischemic medical therapy: No Therapy  Non-Invasive Test Results: No non-invasive testing performed  Prior CABG: No previous CABG      History and Physical Interval Note:  05/23/2014 10:58 AM  Shirley Short  has presented today for surgery, with the diagnosis of STEMI  The various methods of treatment have been discussed with the patient and family. After consideration of risks, benefits and other options for treatment, the patient has consented to  Procedure(s): LEFT HEART CATHETERIZATION WITH CORONARY ANGIOGRAM (N/A) as a surgical intervention .  The patient's history has been reviewed, patient examined, no change in status, stable for surgery.  I have reviewed the patient's chart and labs.  Questions were answered to the patient's satisfaction.     Runell GessBERRY,Caylen Yardley J

## 2014-05-23 NOTE — Progress Notes (Signed)
ANTICOAGULATION CONSULT NOTE - Follow Up Consult  Pharmacy Consult for heparin Indication: chest pain/ACS  Allergies  Allergen Reactions  . Compazine [Prochlorperazine Edisylate] Other (See Comments)  . Avelox [Moxifloxacin Hcl In Nacl]   . Levaquin [Levofloxacin In D5w]     Patient Measurements: Height: 5\' 4"  (162.6 cm) Weight: 163 lb 12.8 oz (74.3 kg) IBW/kg (Calculated) : 54.7 Heparin Dosing Weight: 70kg  Vital Signs: Temp: 97.6 F (36.4 C) (02/29 1245) Temp Source: Oral (02/29 1245) BP: 78/38 mmHg (02/29 1245) Pulse Rate: 112 (02/29 1058)    Medical History: Past Medical History  Diagnosis Date  . CAD (coronary artery disease), native coronary artery 05/23/2014  . Insulin dependent diabetes mellitus   . Noncompliance   . Obesity (BMI 30-39.9) 05/23/2014  . Hyperlipidemia 05/23/2014  . Hypertension   . Cocaine abuse 05/23/2014   Assessment: 53yo female admitted 05/23/2014  was initially called in the field as code STEMI, subsequently canceled. Pharmacy consulted to begin heparin, s/p cath 2.29 to resume heparin 3h after TR band off.  PMH: DM, HTN, hyperlipidemia, drug abuse including cocaine recently,   Coag: ACS, significant 2 vessel disease (RCA & LAD), to resume heparin after TR band off 3h  Goal of Therapy:  Heparin level 0.3-0.7 units/ml Monitor platelets by anticoagulation protocol: Yes   Plan:  Resume heparin at 1000 units/hr 3h after TR band off Follow up with HL 6h after ggt resumed Daily heparin level and CBC  Thank you for allowing pharmacy to be a part of this patients care team.  Lovenia KimJulie Caralyn Twining Pharm.D., BCPS, AQ-Cardiology Clinical Pharmacist 05/23/2014 1:00 PM Pager: [phone] Phone: [phone]

## 2014-05-23 NOTE — H&P (View-Only) (Signed)
 Subjective:  Pt still complaining of 6/10 chest pain; but does not appear uncomfortable,  Objective:   Vital Signs : Filed Vitals:   05/23/14 0700 05/23/14 0750 05/23/14 0800 05/23/14 0812  BP: 154/78   124/87  Pulse: 107 102    Temp:   98 F (36.7 C)   TempSrc:   Oral   Resp: 12 11    Height:      Weight:      SpO2: 87% 89%      Intake/Output from previous day:  Intake/Output Summary (Last 24 hours) at 05/23/14 0829 Last data filed at 05/23/14 0810  Gross per 24 hour  Intake  37.83 ml  Output      0 ml  Net  37.83 ml     I/O since admission: +37.8  Wt Readings from Last 3 Encounters:  05/23/14 162 lb 0.6 oz (73.5 kg)    Medications: . amLODipine  5 mg Oral Daily  . [START ON 05/24/2014] aspirin EC  81 mg Oral Daily  . atorvastatin  80 mg Oral q1800  . insulin aspart  0-20 Units Subcutaneous TID WC  . insulin glargine  40 Units Subcutaneous QHS  . sodium chloride  10-40 mL Intracatheter Q12H    . heparin 1,000 Units/hr (05/23/14 0700)    Physical Exam:   General appearance: no distress Neck: no adenopathy, no carotid bruit, no JVD, supple, symmetrical, trachea midline and thyroid not enlarged, symmetric, no tenderness/mass/nodules Lungs: scattered rhonchi anteriorly; no rales Heart: RRR 1/6 systolic murmur Abdomen: soft, non-tender; bowel sounds normal; no masses,  no organomegaly Extremities: indurated cyst with warmth on dorsum of R hand at prior of a sewing needle had broken off and had been excised surgicall Pulses: slightly decreased Skin: warmth and tenderness to cyst on R hand dorsum Neurologic: grossly nonfocal   Rate: 93  Rhythm: sinus with LBBB  ECG (independently read by me): ST at 108; LBBB with repolarization  Lab Results:  BMP Latest Ref Rng 05/23/2014 04/15/2009 04/14/2009  Glucose 70 - 99 mg/dL 429(H) 231(H) 408(H)  BUN 6 - 23 mg/dL 10 9 9  Creatinine 0.50 - 1.10 mg/dL 0.77 0.74 0.77  Sodium 135 - 145 mmol/L 132(L) 142 DELTA CHECK  NOTED 134(L)  Potassium 3.5 - 5.1 mmol/L 4.1 3.7 4.0  Chloride 96 - 112 mmol/L 100 101 97  CO2 19 - 32 mmol/L 27 29 26  Calcium 8.4 - 10.5 mg/dL 9.4 9.7 9.7     CBC Latest Ref Rng 04/15/2009 04/14/2009 12/20/2008  WBC 4.0 - 10.5 K/uL 10.4 9.4 12.1(H)  Hemoglobin 12.0 - 15.0 g/dL 14.5 14.3 12.4  Hematocrit 36.0 - 46.0 % 42.0 41.6 36.8  Platelets 150 - 400 K/uL 158 147(L) 147(L)      Recent Labs  05/23/14 0455  TROPONINI 0.03    Hepatic Function Panel  Recent Labs  05/23/14 0455  PROT 7.0  ALBUMIN 3.4*  AST 15  ALT 9  ALKPHOS 92  BILITOT 0.2*   No results for input(s): INR in the last 72 hours. BNP (last 3 results) No results for input(s): BNP in the last 8760 hours.  ProBNP (last 3 results) No results for input(s): PROBNP in the last 8760 hours.   Lipid Panel     Component Value Date/Time   CHOL * 04/15/2009 0445    320        ATP III CLASSIFICATION:  <200     mg/dL   Desirable  200-239  mg/dL     Borderline High  >=240    mg/dL   High          TRIG 213* 04/15/2009 0445   HDL 39* 04/15/2009 0445   CHOLHDL 8.2 04/15/2009 0445   VLDL 43* 04/15/2009 0445   LDLCALC * 04/15/2009 0445    238        Total Cholesterol/HDL:CHD Risk Coronary Heart Disease Risk Table                     Men   Women  1/2 Average Risk   3.4   3.3  Average Risk       5.0   4.4  2 X Average Risk   9.6   7.1  3 X Average Risk  23.4   11.0        Use the calculated Patient Ratio above and the CHD Risk Table to determine the patient's CHD Risk.        ATP III CLASSIFICATION (LDL):  <100     mg/dL   Optimal  100-129  mg/dL   Near or Above                    Optimal  130-159  mg/dL   Borderline  160-189  mg/dL   High  >190     mg/dL   Very High      Imaging:  No results found.    Assessment/Plan:   Principal Problem:   Acute coronary syndrome Active Problems:   Insulin dependent diabetes mellitus   Cocaine abuse   Noncompliance   CAD (coronary artery disease),  native coronary artery   Obesity (BMI 30-39.9)   COPD (chronic obstructive pulmonary disease)  See earlier H&P by Dr. Tilley.  Additional data and assessment; Pt has a 40 yr h/o ongoining tobacco use ( currently 1 1/2 ppd) 20 yr h/o intermittent cocaine (she gets this free since her boyfriend sell it), has experienced chest tightness with sexual intercoarse, and admitsd to decreased activity and dyspnea. Prior cath in 2010 disclosed LAD spasm and some concomitant disease. She is diabetic, has a peripheral neuropathy. She was awakened with chest tightness "like an elephant sitting on my chest" associated with left arm radiation, and diaphoresis. She refuses nitrates due to prior migraine exacerbation. She appears comfortable despite sayin pain is 6/10 presently. She has a probable superficial skin abscess on dorsum of R hand; will initiate keflex orally. Echo data reviewed; septal abnormality may be contributed by LBBB.  I had a long talk about smoking cessation and coacain use. With risk factors I have recommended definitive cath. ? Groin approach with R hand superficial infection.  The risks and benefits of a cardiac catheterization including, but not limited to, death, stroke, MI, kidney damage and bleeding were discussed with the patient who indicates understanding and agrees to proceed.  Will hydrate with NS.  Pt states that she has been on both gabapentin and lyrica for her peripheral neuropathy. Will start gabapentin for her neuropathy.  Time spent: 40 minutes   Thomas A. Kelly, MD, FACC 05/23/2014, 8:29 AM 

## 2014-05-23 NOTE — Progress Notes (Signed)
Subjective:  Pt still complaining of 6/10 chest pain; but does not appear uncomfortable,  Objective:   Vital Signs : Filed Vitals:   05/23/14 0700 05/23/14 0750 05/23/14 0800 05/23/14 0812  BP: 154/78   124/87  Pulse: 107 102    Temp:   98 F (36.7 C)   TempSrc:   Oral   Resp: 12 11    Height:      Weight:      SpO2: 87% 89%      Intake/Output from previous day:  Intake/Output Summary (Last 24 hours) at 05/23/14 0829 Last data filed at 05/23/14 0810  Gross per 24 hour  Intake  37.83 ml  Output      0 ml  Net  37.83 ml     I/O since admission: +37.8  Wt Readings from Last 3 Encounters:  05/23/14 162 lb 0.6 oz (73.5 kg)    Medications: . amLODipine  5 mg Oral Daily  . [START ON 05/24/2014] aspirin EC  81 mg Oral Daily  . atorvastatin  80 mg Oral q1800  . insulin aspart  0-20 Units Subcutaneous TID WC  . insulin glargine  40 Units Subcutaneous QHS  . sodium chloride  10-40 mL Intracatheter Q12H    . heparin 1,000 Units/hr (05/23/14 0700)    Physical Exam:   General appearance: no distress Neck: no adenopathy, no carotid bruit, no JVD, supple, symmetrical, trachea midline and thyroid not enlarged, symmetric, no tenderness/mass/nodules Lungs: scattered rhonchi anteriorly; no rales Heart: RRR 1/6 systolic murmur Abdomen: soft, non-tender; bowel sounds normal; no masses,  no organomegaly Extremities: indurated cyst with warmth on dorsum of R hand at prior of a sewing needle had broken off and had been excised surgicall Pulses: slightly decreased Skin: warmth and tenderness to cyst on R hand dorsum Neurologic: grossly nonfocal   Rate: 93  Rhythm: sinus with LBBB  ECG (independently read by me): ST at 108; LBBB with repolarization  Lab Results:  BMP Latest Ref Rng 05/23/2014 04/15/2009 04/14/2009  Glucose 70 - 99 mg/dL 161(W) 960(A) 540(J)  BUN 6 - 23 mg/dL Creatinine 0.50 - 1.10 mg/dL 8.11 9.14 7.82  Sodium 135 - 145 mmol/L 132(L) 142 DELTA CHECK  NOTED 134(L)  Potassium 3.5 - 5.1 mmol/L 4.1 3.7 4.0  Chloride 96 - 112 mmol/L 100 101 97  CO2 19 - 32 mmol/L Calcium 8.4 - 10.5 mg/dL 9.4 9.7 9.7     CBC Latest Ref Rng 04/15/2009 04/14/2009 12/20/2008  WBC 4.0 - 10.5 K/uL 10.4 9.4 12.1(H)  Hemoglobin 12.0 - 15.0 g/dL 95.6 21.3 08.6  Hematocrit 36.0 - 46.0 % 42.0 41.6 36.8  Platelets 150 - 400 K/uL 158 147(L) 147(L)      Recent Labs  05/23/14 0455  TROPONINI 0.03    Hepatic Function Panel  Recent Labs  05/23/14 0455  PROT 7.0  ALBUMIN 3.4*  AST 15  ALT 9  ALKPHOS 92  BILITOT 0.2*   No results for input(s): INR in the last 72 hours. BNP (last 3 results) No results for input(s): BNP in the last 8760 hours.  ProBNP (last 3 results) No results for input(s): PROBNP in the last 8760 hours.   Lipid Panel     Component Value Date/Time   CHOL * 04/15/2009 0445    320        ATP III CLASSIFICATION:  <200     mg/dL   Desirable  578-469  mg/dL  Borderline High  >=240    mg/dL   High          TRIG [id number]* 04/15/2009 0445   HDL 39* 04/15/2009 0445   CHOLHDL 8.2 04/15/2009 0445   VLDL 43* 04/15/2009 0445   LDLCALC * 04/15/2009 0445    238        Total Cholesterol/HDL:CHD Risk Coronary Heart Disease Risk Table                     Men   Women  1/2 Average Risk   3.4   3.3  Average Risk       5.0   4.4  2 X Average Risk   9.6   7.1  3 X Average Risk  23.4   11.0        Use the calculated Patient Ratio above and the CHD Risk Table to determine the patient's CHD Risk.        ATP III CLASSIFICATION (LDL):  <100     mg/dL   Optimal  [id number]  mg/dL   Near or Above                    Optimal  130-159  mg/dL   Borderline  [id number]  mg/dL   High  >956>190     mg/dL   Very High      Imaging:  No results found.    Assessment/Plan:   Principal Problem:   Acute coronary syndrome Active Problems:   Insulin dependent diabetes mellitus   Cocaine abuse   Noncompliance   CAD (coronary artery disease),  native coronary artery   Obesity (BMI 30-39.9)   COPD (chronic obstructive pulmonary disease)  See earlier H&P by Dr. Donnie Ahoilley.  Additional data and assessment; Pt has a 40 yr h/o ongoining tobacco use ( currently 1 1/2 ppd) 20 yr h/o intermittent cocaine (she gets this free since her boyfriend sell it), has experienced chest tightness with sexual intercoarse, and admitsd to decreased activity and dyspnea. Prior cath in 2010 disclosed LAD spasm and some concomitant disease. She is diabetic, has a peripheral neuropathy. She was awakened with chest tightness "like an elephant sitting on my chest" associated with left arm radiation, and diaphoresis. She refuses nitrates due to prior migraine exacerbation. She appears comfortable despite sayin pain is 6/10 presently. She has a probable superficial skin abscess on dorsum of R hand; will initiate keflex orally. Echo data reviewed; septal abnormality may be contributed by LBBB.  I had a long talk about smoking cessation and coacain use. With risk factors I have recommended definitive cath. ? Groin approach with R hand superficial infection.  The risks and benefits of a cardiac catheterization including, but not limited to, death, stroke, MI, kidney damage and bleeding were discussed with the patient who indicates understanding and agrees to proceed.  Will hydrate with NS.  Pt states that she has been on both gabapentin and lyrica for her peripheral neuropathy. Will start gabapentin for her neuropathy.  Time spent: 40 minutes   Lennette Biharihomas A. Kelly, MD, [facility] 05/23/2014, 8:29 AM

## 2014-05-23 NOTE — Progress Notes (Signed)
Pt at times very upset she is requesting xanax 1 mg po - I have ordered this BID-prn, she has been on cocaine prior to admit and is unable to smoke.

## 2014-05-23 NOTE — Progress Notes (Signed)
eLink Physician-Brief Progress Note Patient Name: Shirley SimmondsDonna L Short DOB: 10/11/1960 MRN: [id number]   Date of Service  05/23/2014  HPI/Events of Note  Chest pain admission.  eICU Interventions  Nothing to add.        YACOUB,WESAM 05/23/2014, 5:27 AM

## 2014-05-23 NOTE — Progress Notes (Signed)
ANTICOAGULATION CONSULT NOTE - Follow Up Consult  Pharmacy Consult for Heparin Indication: chest pain/ACS  Allergies  Allergen Reactions  . Compazine [Prochlorperazine Edisylate] Other (See Comments)  . Avelox [Moxifloxacin Hcl In Nacl]   . Levaquin [Levofloxacin In D5w]     Patient Measurements: Height: 5\' 4"  (162.6 cm) Weight: 163 lb 12.8 oz (74.3 kg) IBW/kg (Calculated) : 54.7 Heparin Dosing Weight: 70kg  Vital Signs: Temp: 97.7 F (36.5 C) (02/29 2000) Temp Source: Oral (02/29 2000) BP: 97/63 mmHg (02/29 2130) Pulse Rate: 103 (02/29 2130)  Labs:  Recent Labs  05/23/14 0455 05/23/14 1300 05/23/14 1800 05/23/14 2130  HEPARINUNFRC  --   --   --  0.20*  CREATININE 0.77  --   --   --   TROPONINI 0.03 0.03 0.10*  --     Estimated Creatinine Clearance: 80.2 mL/min (by C-G formula based on Cr of 0.77).   Medications:  Heparin @ 1000 units/hr  Assessment: 53yof s/p cath found to have severe 2v CAD. She was resumed on heparin post-cath. Initial heparin level is below goal at 0.20.   Goal of Therapy:  Heparin level 0.3-0.7 units/ml Monitor platelets by anticoagulation protocol: Yes   Plan:  1) Increase heparin to 1200 units/hr 2) Follow up daily heparin level  Fredrik RiggerMarkle, Kierra Jezewski Sue 05/23/2014,10:04 PM

## 2014-05-23 NOTE — Progress Notes (Signed)
ANTICOAGULATION CONSULT NOTE - Initial Consult  Pharmacy Consult for heparin Indication: chest pain/ACS  Allergies not on file  Patient Measurements: Height: 5\' 4"  (162.6 cm) Weight: 162 lb 0.6 oz (73.5 kg) IBW/kg (Calculated) : 54.7 Heparin Dosing Weight: 70kg  Vital Signs: BP: 173/100 mmHg (02/29 0436)    Medical History: Past Medical History  Diagnosis Date  . CAD (coronary artery disease), native coronary artery 05/23/2014  . Insulin dependent diabetes mellitus   . Noncompliance   . Obesity (BMI 30-39.9) 05/23/2014     Assessment: 53yo female was initially called in the field as code STEMI, subsequently canceled, to begin heparin.  Goal of Therapy:  Heparin level 0.3-0.7 units/ml Monitor platelets by anticoagulation protocol: Yes   Plan:  [facility] but no gtt started; will begin heparin gtt at 1000 units/hr and monitor heparin levels and CBC.  Vernard GamblesVeronda Lane Eland, PharmD, BCPS  05/23/2014,5:03 AM

## 2014-05-23 NOTE — Care Management Note (Signed)
    Page 1 of 1   05/23/2014     10:30:41 AM CARE MANAGEMENT NOTE 05/23/2014  Patient:  [name],Dorla L   Account Number:  [id number]  Date Initiated:  05/23/2014  Documentation initiated by:  Junius CreamerWELL,DEBBIE  Subjective/Objective Assessment:   adm w acute cor syndrome     Action/Plan:   lives at home   Anticipated DC Date:     Anticipated DC Plan:  HOME/SELF CARE  In-house referral  Clinical Social Worker         Choice offered to / List presented to:             Status of service:   Medicare Important Message given?   (If response is "NO", the following Medicare IM given date fields will be blank) Date Medicare IM given:   Medicare IM given by:   Date Additional Medicare IM given:   Additional Medicare IM given by:    Discharge Disposition:    Per UR Regulation:  Reviewed for med. necessity/level of care/duration of stay  If discussed at Long Length of Stay Meetings, dates discussed:    Comments:

## 2014-05-23 NOTE — Progress Notes (Signed)
Echocardiogram reviewed at bedside.  Appears to have LVH, has abnormal septal motion with estimated ejection fraction around 50-55%, possible hypokinesis of the apex and distal septum but difficult to tell on the presence of left bundle branch block.  Lateral wall and inferior wall move well.  Discussed with interventional cardiology over phone.  The patient nothing by mouth for possible cath.  Patient continues to have chest pain and is also asking for psych consult while she is here and for medicines to calm her down.  Darden PalmerW. Spencer Tilley, Jr. MD [facility]

## 2014-05-23 NOTE — Progress Notes (Addendum)
Pt continues to be agitated. Phone has been provided and pt has called everyone she was looking to contact. After one conversation she became more anxious to the point where she vomited three times into bedside commode and HR increased to 120. Contacted physician regarding her request for more xanax. BP continues to be low.   1620- Pt vomited again. Clear liquid(appears to be soda recently provided to her). Zofran administered as per order.    1650- Pt now lying in bed and appears to be sleeping. Will continue to monitor for agitation and keep the physician aware.   1750- Spoke to Nada BoozerLaura Ingold NP. 1mg  xanax ordered if needed. Also made Vernona RiegerLaura aware pt. Has not voided yet today. Will continue to monitor.

## 2014-05-24 ENCOUNTER — Encounter (HOSPITAL_COMMUNITY): Payer: Self-pay | Admitting: Cardiovascular Disease

## 2014-05-24 LAB — BASIC METABOLIC PANEL
ANION GAP: 8 (ref 5–15)
BUN: 16 mg/dL (ref 6–23)
CHLORIDE: 102 mmol/L (ref 96–112)
CO2: 26 mmol/L (ref 19–32)
Calcium: 9.1 mg/dL (ref 8.4–10.5)
Creatinine, Ser: 1.1 mg/dL (ref 0.50–1.10)
GFR calc Af Amer: 65 mL/min — ABNORMAL LOW (ref >90)
GFR calc non Af Amer: 56 mL/min — ABNORMAL LOW (ref >90)
GLUCOSE: 283 mg/dL — AB (ref 70–99)
Potassium: 3.9 mmol/L (ref 3.5–5.1)
SODIUM: 136 mmol/L (ref 135–145)

## 2014-05-24 LAB — CBC
HCT: 40.1 % (ref 36.0–46.0)
HEMOGLOBIN: 13.7 g/dL (ref 12.0–15.0)
MCH: 27.8 pg (ref 26.0–34.0)
MCHC: 34.2 g/dL (ref 30.0–36.0)
MCV: 81.5 fL (ref 78.0–100.0)
PLATELETS: 123 10*3/uL — AB (ref 150–400)
RBC: 4.92 MIL/uL (ref 3.87–5.11)
RDW: 13.4 % (ref 11.5–15.5)
WBC: 11.9 10*3/uL — AB (ref 4.0–10.5)

## 2014-05-24 LAB — LIPID PANEL
CHOLESTEROL: 215 mg/dL — AB (ref 0–200)
HDL: 46 mg/dL (ref >39)
LDL Cholesterol: 140 mg/dL — ABNORMAL HIGH (ref 0–99)
TRIGLYCERIDES: 145 mg/dL (ref <150)
Total CHOL/HDL Ratio: 4.7 RATIO
VLDL: 29 mg/dL (ref 0–40)

## 2014-05-24 LAB — GLUCOSE, CAPILLARY
GLUCOSE-CAPILLARY: 225 mg/dL — AB (ref 70–99)
GLUCOSE-CAPILLARY: 269 mg/dL — AB (ref 70–99)
Glucose-Capillary: 332 mg/dL — ABNORMAL HIGH (ref 70–99)
Glucose-Capillary: 252 mg/dL — ABNORMAL HIGH (ref 70–99)

## 2014-05-24 LAB — HEPARIN LEVEL (UNFRACTIONATED)
HEPARIN UNFRACTIONATED: 0.28 [IU]/mL — AB (ref 0.30–0.70)
Heparin Unfractionated: 0.43 IU/mL (ref 0.30–0.70)

## 2014-05-24 MED ORDER — ALPRAZOLAM 0.5 MG PO TABS
1.0000 mg | ORAL_TABLET | Freq: Three times a day (TID) | ORAL | Status: DC | PRN
  Administered 2014-05-24 – 2014-05-31 (×18): 1 mg via ORAL
  Filled 2014-05-24 (×17): qty 2

## 2014-05-24 MED ORDER — GABAPENTIN 600 MG PO TABS
600.0000 mg | ORAL_TABLET | Freq: Three times a day (TID) | ORAL | Status: DC
  Administered 2014-05-24 – 2014-05-25 (×4): 600 mg via ORAL
  Filled 2014-05-24 (×6): qty 1

## 2014-05-24 MED ORDER — HEPARIN SODIUM (PORCINE) 5000 UNIT/ML IJ SOLN
5000.0000 [IU] | Freq: Three times a day (TID) | INTRAMUSCULAR | Status: DC
  Administered 2014-05-24 – 2014-05-31 (×18): 5000 [IU] via SUBCUTANEOUS
  Filled 2014-05-24 (×27): qty 1

## 2014-05-24 MED ORDER — HYDROMORPHONE HCL 1 MG/ML IJ SOLN
1.0000 mg | INTRAMUSCULAR | Status: DC | PRN
  Administered 2014-05-24 – 2014-05-27 (×11): 1 mg via INTRAVENOUS
  Filled 2014-05-24 (×12): qty 1

## 2014-05-24 MED ORDER — METOCLOPRAMIDE HCL 5 MG/ML IJ SOLN
5.0000 mg | Freq: Three times a day (TID) | INTRAMUSCULAR | Status: DC
  Administered 2014-05-24 – 2014-05-27 (×9): 5 mg via INTRAVENOUS
  Filled 2014-05-24 (×13): qty 1

## 2014-05-24 MED ORDER — INSULIN ASPART 100 UNIT/ML ~~LOC~~ SOLN
5.0000 [IU] | Freq: Three times a day (TID) | SUBCUTANEOUS | Status: DC
  Administered 2014-05-24 – 2014-05-31 (×16): 5 [IU] via SUBCUTANEOUS

## 2014-05-24 MED ORDER — INSULIN GLARGINE 100 UNIT/ML ~~LOC~~ SOLN
45.0000 [IU] | Freq: Every day | SUBCUTANEOUS | Status: DC
  Administered 2014-05-24 – 2014-05-26 (×3): 45 [IU] via SUBCUTANEOUS
  Filled 2014-05-24 (×4): qty 0.45

## 2014-05-24 MED ORDER — MORPHINE SULFATE 2 MG/ML IJ SOLN
2.0000 mg | INTRAMUSCULAR | Status: DC | PRN
  Administered 2014-05-24 (×3): 2 mg via INTRAVENOUS
  Filled 2014-05-24 (×3): qty 1

## 2014-05-24 NOTE — Progress Notes (Signed)
Subjective:  Continues to complain of pain but clinically does not appear to be in significant discomfort  Objective:   Vital Signs : Filed Vitals:   05/24/14 0215 05/24/14 0300 05/24/14 0400 05/24/14 0505  BP: 95/50 97/51 103/57 100/63  Pulse: 108 102 103 106  Temp:      TempSrc:      Resp: Height:      Weight:      SpO2: 89% 97% 95% 92%    Intake/Output from previous day:  Intake/Output Summary (Last 24 hours) at 05/24/14 0805 Last data filed at 05/24/14 0500  Gross per 24 hour  Intake 2921.86 ml  Output    800 ml  Net 2121.86 ml     I/O since admission: +2149.7   Wt Readings from Last 3 Encounters:  05/23/14 163 lb 12.8 oz (74.3 kg)    Medications: . amLODipine  5 mg Oral Daily  . aspirin EC  81 mg Oral Daily  . atorvastatin  80 mg Oral q1800  . cephALEXin  500 mg Oral 4 times per day  . gabapentin  600 mg Oral BID  . insulin aspart  0-20 Units Subcutaneous TID WC  . insulin glargine  40 Units Subcutaneous QHS  . nicotine  21 mg Transdermal Daily  . pneumococcal 23 valent vaccine  0.5 mL Intramuscular Tomorrow-1000  . sodium chloride  10-40 mL Intracatheter Q12H    . sodium chloride 75 mL/hr (05/24/14 0425)  . DOPamine 12 mcg/kg/min (05/24/14 0507)  . heparin 1,200 Units/hr (05/24/14 0230)    Physical Exam:   General appearance: alert, cooperative and no distress Neck: no adenopathy, no carotid bruit, no JVD, supple, symmetrical, trachea midline and thyroid not enlarged, symmetric, no tenderness/mass/nodules Lungs: rhonchi which improved with coughing; no rales Heart: Tachycardic ay 106; 1/6 systolic murmur; no rubs, thrills, heaves Abdomen: mild central adiposity; BS +; nontender Extremities: area of induration and tendrness on dorsum of R hand; slightly improved from yesterday; no LE edema Pulses: 2+ and symmetric R radial cath site; stable; pulses slightly decreased; no change Skin: Skin color, texture, turgor normal. No rashes or  lesions or R hand induration ; less erythema Neurologic: Grossly normal  Psychological: anxious; concerned about pain meds   Rate: 110  Rhythm: sinus tachycardia  ECG (independently read by me): ST at 106; LBBB with repolarization changes  Lab Results:  BMP Latest Ref Rng 05/23/2014 04/15/2009 04/14/2009  Glucose 70 - 99 mg/dL 161(W) 960(A) 540(J)  BUN 6 - 23 mg/dL Creatinine 0.50 - 1.10 mg/dL 8.11 9.14 7.82  Sodium 135 - 145 mmol/L 132(L) 142 DELTA CHECK NOTED 134(L)  Potassium 3.5 - 5.1 mmol/L 4.1 3.7 4.0  Chloride 96 - 112 mmol/L 100 101 97  CO2 19 - 32 mmol/L Calcium 8.4 - 10.5 mg/dL 9.4 9.7 9.7     CBC Latest Ref Rng 05/24/2014 04/15/2009 04/14/2009  WBC 4.0 - 10.5 K/uL 11.9(H) 10.4 9.4  Hemoglobin 12.0 - 15.0 g/dL 95.6 21.3 08.6  Hematocrit 36.0 - 46.0 % 40.1 42.0 41.6  Platelets 150 - 400 K/uL 123(L) 158 147(L)      Recent Labs  05/23/14 1300 05/23/14 1800  TROPONINI 0.03 0.10*    Hepatic Function Panel  Recent Labs  05/23/14 0455  PROT 7.0  ALBUMIN 3.4*  AST 15  ALT 9  ALKPHOS 92  BILITOT 0.2*   No results for input(s): INR in the last 72 hours.  BNP (last 3 results) No results for input(s): BNP in the last 8760 hours.  ProBNP (last 3 results) No results for input(s): PROBNP in the last 8760 hours.   Lipid Panel     Component Value Date/Time   CHOL 215* 05/24/2014 0245   TRIG 145 05/24/2014 0245   HDL 46 05/24/2014 0245   CHOLHDL 4.7 05/24/2014 0245   VLDL 29 05/24/2014 0245   LDLCALC 140* 05/24/2014 0245      05/23/14 CATH ANGIOGRAPHIC RESULTS:   1. Left main; normal  2. LAD; 70% smooth segmental distal. There was a high diagonal branch which was small in caliber that had 70-80% segmental mid stenosis 3. Left circumflex; codominant and free of significant disease.  4. Right coronary artery; codominant with 75% segmental PLA stenosis. The PDA arose high in the RCA 5. Left ventriculography; RAO left ventriculogram was  performed using  25 mL of Visipaque dye at 12 mL/second. The overall LVEF estimated  60 % Without wall motion abnormalities  Assessment/Plan:   Principal Problem:   Acute coronary syndrome Active Problems:   Insulin dependent diabetes mellitus   Cocaine abuse   Noncompliance   CAD (coronary artery disease), native coronary artery   Obesity (BMI 30-39.9)   COPD (chronic obstructive pulmonary disease)  1. CAD: Cath films personally reviewed by me. 2 v CAD with slight progression from 2010. Unable to give IC NTG during study to assess for coronary spasm. Currently on amlodipine ; and tachycardic. Will try to wean off dopamine which may be contributing to increased HR and may need to change amlodipine to cardizem or verapamil to assist with rate control. Discussed findings with Dr. Allyson SabalBerry; will initially try medical therapy rather than PCI. 2. Hypotension; improved with fluids and dopamine; currently attempting to wean of pressor. 3. Hyperlipidemia:  LDL 140; atorvastatin initiated. 4. R hand superficial abscess; started yesterday on cefelexin; less indurated; if does not improve my need I&D 5. DM: not well controlled. Will check HbA1c; Glu today 429; received first dose of lantus last night. Metformin on hold for 48hrs post cath. 6. Hyponatremia; Na 132; will recheck and reduce fluids to 50 cc/hr 7. Mildly thrombocytopenic: Plt 123K  8. LBBB 9. Tobacco abuse; again discussed smoking cessation; currently on nicotine patch 10. Cocaine use; discussed avoidance of social contacts     Lennette Biharihomas A. Kelly, MD, [facility] HospitalFACC 05/24/2014, 8:05 AM

## 2014-05-24 NOTE — Progress Notes (Signed)
XC   Called for 10/10 pain. Morphine changed to dilaudid (previously stopped due to hypotension).  BP 142/78 mmHg  Pulse 97  Temp(Src) 98.6 F (37 C) (Axillary)  Resp 21  Ht 5\' 4"  (1.626 m)  Wt 74.3 kg (163 lb 12.8 oz)  BMI 28.10 kg/m2  SpO2 88%  Dossie ArbourEdward Nuriya Stuck, MD

## 2014-05-24 NOTE — Progress Notes (Addendum)
Inpatient Diabetes Program Recommendations  AACE/ADA: New Consensus Statement on Inpatient Glycemic Control (2013)  Target Ranges:  Prepandial:   less than 140 mg/dL      Peak postprandial:   less than 180 mg/dL (1-2 hours)      Critically ill patients:  140 - 180 mg/dL   Results for Shirley Short, Shirley Short (MRN [id number]) as of 05/24/2014 11:40  Ref. Range 05/23/2014 05:45 05/23/2014 08:05 05/23/2014 12:46 05/23/2014 16:09 05/23/2014 21:23 05/24/2014 07:45 05/24/2014 11:10  Glucose-Capillary Latest Range: 70-99 mg/dL 782403 (H) 956316 (H) 87 213219 (H) 261 (H) 332 (H) 252 (H)   Diabetes history: DM2 Outpatient Diabetes medications: Lantus 40 units QHS, Humulin R 0-20 units TID with meals Current orders for Inpatient glycemic control: Lantus 40 units QHs, Novolog 0-20 units TID with meals  Inpatient Diabetes Program Recommendations Insulin - Basal: Please consider increasing Lantus to 45 units QHS. Insulin - Meal Coverage: Please consider ordering Novolog 5 units TID with meals for meal coverage if patient eats at least 50% of meals (in addition to Novolog correction scale). Insulin-Correction: Please consider ordering Novolog bedtime correction scale.  Note: Patient lying in bed on right side asleep upon entering the room. During conversation, patient kept closing eyes and drifting back off to sleep. Had to call her name and tap her arm frequently for her to wake up and engage in conversation. Spoke with patient about diabetes and home regimen for diabetes control. Patient reports that she was followed [facility] her [facility] until she had a disagreement with her PCP. According to the patient she last saw Dr. Genice RougeSubuhi about 6 months ago. Currently she takes Lantus 40 units QHS, Humulin R 0-20 units TID, and Metformin 500 mg BID as an outpatient for diabetes control.  Patient reports that she checks her glucose 3 times a day and her glucose usually runs between 200-300's mg/dl.  Inquired  about knowledge about A1C and patient reports that she knows what an A1C is but does not recall her last A1C.  Informed patient a current A1C has been ordered but results are pending at this time. Reviewed what an A1C is, A1C goals, and how an A1C relates to actual glucose values. Discussed basic pathophysiology of DM Type 2, basic home care, importance of checking CBGs and maintaining good CBG control to prevent long-term and short-term complications. Discussed impact of nutrition, exercise, stress, sickness, and medications on diabetes control.  Discussed carbohydrates, carbohydrate goals per day and meal, along with portion sizes. Stressed importance of improving glycemic control to decrease risk of potential complications associated with uncontrolled diabetes. Encouraged patient to call Medicaid office and ask about assigning a new PCP if she is unhappy with Dr. Genice RougeSubuhi. Stressed that she will need a doctor she can follow up with and that will help her manage her chronic diseases.  Asked patient to check her glucose 3-4 times per day and to keep a log she can take with her to follow up visits so the doctor can use the glucose readings to make adjustments with DM medications.   Patient verbalized understanding of information discussed and she states that she has no further questions at this time related to diabetes.   Thanks, Orlando PennerMarie Liyana Suniga, RN, MSN, CCRN Diabetes Coordinator Inpatient Diabetes Program [phone] (Team Pager) [phone] ([facility] office) [phone] [facility] office)

## 2014-05-25 ENCOUNTER — Encounter (HOSPITAL_COMMUNITY)
Admission: EM | Disposition: A | Discharge status: Other Acute Inpatient Hospital | Payer: Self-pay | Source: Other Acute Inpatient Hospital | Attending: Cardiovascular Disease

## 2014-05-25 ENCOUNTER — Inpatient Hospital Stay (HOSPITAL_COMMUNITY): Payer: Medicaid - Out of State | Admitting: Anesthesiology

## 2014-05-25 DIAGNOSIS — L02511 Cutaneous abscess of right hand: Secondary | ICD-10-CM

## 2014-05-25 DIAGNOSIS — I25118 Atherosclerotic heart disease of native coronary artery with other forms of angina pectoris: Secondary | ICD-10-CM

## 2014-05-25 HISTORY — PX: I&D EXTREMITY: SHX5045

## 2014-05-25 LAB — CBC
HCT: 36.5 % (ref 36.0–46.0)
Hemoglobin: 12.3 g/dL (ref 12.0–15.0)
MCH: 27.5 pg (ref 26.0–34.0)
MCHC: 33.7 g/dL (ref 30.0–36.0)
MCV: 81.7 fL (ref 78.0–100.0)
Platelets: 110 10*3/uL — ABNORMAL LOW (ref 150–400)
RBC: 4.47 MIL/uL (ref 3.87–5.11)
RDW: 13.6 % (ref 11.5–15.5)
WBC: 10.5 10*3/uL (ref 4.0–10.5)

## 2014-05-25 LAB — BASIC METABOLIC PANEL
Anion gap: 8 (ref 5–15)
BUN: 12 mg/dL (ref 6–23)
CALCIUM: 8.8 mg/dL (ref 8.4–10.5)
CO2: 27 mmol/L (ref 19–32)
Chloride: 97 mmol/L (ref 96–112)
Creatinine, Ser: 0.8 mg/dL (ref 0.50–1.10)
GFR calc Af Amer: >90 mL/min (ref >90)
GFR, EST NON AFRICAN AMERICAN: 83 mL/min — AB (ref >90)
GLUCOSE: 348 mg/dL — AB (ref 70–99)
POTASSIUM: 4.3 mmol/L (ref 3.5–5.1)
SODIUM: 132 mmol/L — AB (ref 135–145)

## 2014-05-25 LAB — GRAM STAIN

## 2014-05-25 LAB — GLUCOSE, CAPILLARY
GLUCOSE-CAPILLARY: 343 mg/dL — AB (ref 70–99)
GLUCOSE-CAPILLARY: 146 mg/dL — AB (ref 70–99)
Glucose-Capillary: 116 mg/dL — ABNORMAL HIGH (ref 70–99)

## 2014-05-25 LAB — HEMOGLOBIN A1C
Hgb A1c MFr Bld: 12.1 % — ABNORMAL HIGH (ref 4.8–5.6)
Mean Plasma Glucose: 301 mg/dL

## 2014-05-25 SURGERY — IRRIGATION AND DEBRIDEMENT EXTREMITY
Anesthesia: General | Site: Hand | Laterality: Right

## 2014-05-25 MED ORDER — NAPROXEN 500 MG PO TABS
500.0000 mg | ORAL_TABLET | Freq: Two times a day (BID) | ORAL | Status: DC
  Administered 2014-05-25 – 2014-05-31 (×12): 500 mg via ORAL
  Filled 2014-05-25 (×16): qty 1

## 2014-05-25 MED ORDER — MIDAZOLAM HCL 2 MG/2ML IJ SOLN
INTRAMUSCULAR | Status: AC
  Filled 2014-05-25: qty 2

## 2014-05-25 MED ORDER — LIDOCAINE HCL (CARDIAC) 20 MG/ML IV SOLN
INTRAVENOUS | Status: AC
  Filled 2014-05-25: qty 5

## 2014-05-25 MED ORDER — AMITRIPTYLINE HCL 100 MG PO TABS
100.0000 mg | ORAL_TABLET | Freq: Every day | ORAL | Status: DC
  Administered 2014-05-25 – 2014-05-30 (×6): 100 mg via ORAL
  Filled 2014-05-25 (×8): qty 1

## 2014-05-25 MED ORDER — PROPOFOL 10 MG/ML IV BOLUS
INTRAVENOUS | Status: AC
  Filled 2014-05-25: qty 20

## 2014-05-25 MED ORDER — MIDAZOLAM HCL 5 MG/5ML IJ SOLN
INTRAMUSCULAR | Status: DC | PRN
  Administered 2014-05-25: 2 mg via INTRAVENOUS

## 2014-05-25 MED ORDER — PHENYLEPHRINE HCL 10 MG/ML IJ SOLN
INTRAMUSCULAR | Status: DC | PRN
  Administered 2014-05-25: 120 ug via INTRAVENOUS
  Administered 2014-05-25: 160 ug via INTRAVENOUS
  Administered 2014-05-25 (×4): 120 ug via INTRAVENOUS
  Administered 2014-05-25: 160 ug via INTRAVENOUS

## 2014-05-25 MED ORDER — LACTATED RINGERS IV SOLN
INTRAVENOUS | Status: DC | PRN
  Administered 2014-05-25: 14:00:00 via INTRAVENOUS

## 2014-05-25 MED ORDER — DILTIAZEM HCL 60 MG PO TABS
60.0000 mg | ORAL_TABLET | Freq: Four times a day (QID) | ORAL | Status: AC
  Administered 2014-05-25 – 2014-05-26 (×5): 60 mg via ORAL
  Filled 2014-05-25 (×8): qty 1

## 2014-05-25 MED ORDER — GABAPENTIN 600 MG PO TABS: 600.0000 mg | ORAL_TABLET | Freq: Three times a day (TID) | ORAL | Status: DC

## 2014-05-25 MED ORDER — BUPIVACAINE-EPINEPHRINE (PF) 0.25% -1:200000 IJ SOLN
INTRAMUSCULAR | Status: AC
  Filled 2014-05-25: qty 30

## 2014-05-25 MED ORDER — ONDANSETRON HCL 4 MG/2ML IJ SOLN
INTRAMUSCULAR | Status: AC
  Filled 2014-05-25: qty 2

## 2014-05-25 MED ORDER — LISINOPRIL 20 MG PO TABS
20.0000 mg | ORAL_TABLET | Freq: Every day | ORAL | Status: DC
  Administered 2014-05-26: 20 mg via ORAL
  Filled 2014-05-25 (×2): qty 1

## 2014-05-25 MED ORDER — SENNOSIDES-DOCUSATE SODIUM 8.6-50 MG PO TABS
1.0000 | ORAL_TABLET | Freq: Two times a day (BID) | ORAL | Status: DC
  Administered 2014-05-25 – 2014-05-31 (×12): 1 via ORAL
  Filled 2014-05-25 (×13): qty 1

## 2014-05-25 MED ORDER — PREGABALIN 100 MG PO CAPS
100.0000 mg | ORAL_CAPSULE | Freq: Three times a day (TID) | ORAL | Status: DC | PRN
  Administered 2014-05-27 – 2014-05-29 (×4): 100 mg via ORAL
  Filled 2014-05-25 (×4): qty 1

## 2014-05-25 MED ORDER — METFORMIN HCL 500 MG PO TABS: 500.0000 mg | ORAL_TABLET | Freq: Two times a day (BID) | ORAL | Status: DC

## 2014-05-25 MED ORDER — PROPOFOL 10 MG/ML IV BOLUS
INTRAVENOUS | Status: DC | PRN
  Administered 2014-05-25: 70 mg via INTRAVENOUS

## 2014-05-25 MED ORDER — ROCURONIUM BROMIDE 50 MG/5ML IV SOLN
INTRAVENOUS | Status: AC
  Filled 2014-05-25: qty 1

## 2014-05-25 MED ORDER — FENTANYL CITRATE 0.05 MG/ML IJ SOLN
INTRAMUSCULAR | Status: AC
  Filled 2014-05-25: qty 5

## 2014-05-25 MED ORDER — LIDOCAINE-EPINEPHRINE 1 %-1:100000 IJ SOLN
INTRAMUSCULAR | Status: AC
  Filled 2014-05-25: qty 1

## 2014-05-25 MED ORDER — GABAPENTIN 300 MG PO CAPS
300.0000 mg | ORAL_CAPSULE | Freq: Every day | ORAL | Status: DC
  Administered 2014-05-25 – 2014-05-30 (×6): 300 mg via ORAL
  Filled 2014-05-25 (×7): qty 1

## 2014-05-25 MED ORDER — FENTANYL CITRATE 0.05 MG/ML IJ SOLN: 25.0000 ug | INTRAMUSCULAR | Status: DC | PRN

## 2014-05-25 MED ORDER — HYDROMORPHONE HCL 1 MG/ML IJ SOLN
2.0000 mg | Freq: Once | INTRAMUSCULAR | Status: AC
  Administered 2014-05-25: 2 mg via INTRAVENOUS
  Filled 2014-05-25: qty 2

## 2014-05-25 MED ORDER — CEFAZOLIN SODIUM-DEXTROSE 2-3 GM-% IV SOLR
2.0000 g | Freq: Three times a day (TID) | INTRAVENOUS | Status: DC
  Administered 2014-05-25 – 2014-05-29 (×11): 2 g via INTRAVENOUS
  Filled 2014-05-25 (×17): qty 50

## 2014-05-25 MED ORDER — METFORMIN HCL 500 MG PO TABS
500.0000 mg | ORAL_TABLET | Freq: Two times a day (BID) | ORAL | Status: DC
  Administered 2014-05-26 – 2014-05-31 (×11): 500 mg via ORAL
  Filled 2014-05-25 (×14): qty 1

## 2014-05-25 MED ORDER — EPHEDRINE SULFATE 50 MG/ML IJ SOLN
INTRAMUSCULAR | Status: DC | PRN
  Administered 2014-05-25: 10 mg via INTRAVENOUS

## 2014-05-25 MED ORDER — LISDEXAMFETAMINE DIMESYLATE 20 MG PO CAPS
20.0000 mg | ORAL_CAPSULE | Freq: Every morning | ORAL | Status: DC
  Administered 2014-05-26 – 2014-05-31 (×6): 20 mg via ORAL
  Filled 2014-05-25 (×6): qty 1

## 2014-05-25 MED ORDER — ARTIFICIAL TEARS OP OINT
TOPICAL_OINTMENT | OPHTHALMIC | Status: AC
  Filled 2014-05-25: qty 3.5

## 2014-05-25 MED ORDER — GABAPENTIN 600 MG PO TABS
600.0000 mg | ORAL_TABLET | Freq: Three times a day (TID) | ORAL | Status: DC
  Filled 2014-05-25: qty 1

## 2014-05-25 MED ORDER — BUPIVACAINE HCL (PF) 0.25 % IJ SOLN
INTRAMUSCULAR | Status: AC
  Filled 2014-05-25: qty 30

## 2014-05-25 MED ORDER — FENTANYL CITRATE 0.05 MG/ML IJ SOLN
INTRAMUSCULAR | Status: DC | PRN
  Administered 2014-05-25: 100 ug via INTRAVENOUS

## 2014-05-25 MED ORDER — GABAPENTIN 600 MG PO TABS
600.0000 mg | ORAL_TABLET | Freq: Three times a day (TID) | ORAL | Status: DC
  Administered 2014-05-26 – 2014-05-31 (×17): 600 mg via ORAL
  Filled 2014-05-25 (×22): qty 1

## 2014-05-25 MED ORDER — MEPERIDINE HCL 25 MG/ML IJ SOLN: 6.2500 mg | INTRAMUSCULAR | Status: DC | PRN

## 2014-05-25 MED ORDER — ONDANSETRON HCL 4 MG/2ML IJ SOLN
INTRAMUSCULAR | Status: DC | PRN
  Administered 2014-05-25: 4 mg via INTRAVENOUS

## 2014-05-25 MED ORDER — LIDOCAINE HCL (CARDIAC) 20 MG/ML IV SOLN
INTRAVENOUS | Status: DC | PRN
  Administered 2014-05-25: 50 mg via INTRAVENOUS

## 2014-05-25 MED ORDER — DILTIAZEM HCL 60 MG PO TABS
60.0000 mg | ORAL_TABLET | Freq: Four times a day (QID) | ORAL | Status: DC
  Filled 2014-05-25 (×4): qty 1

## 2014-05-25 MED ORDER — HYDROMORPHONE HCL 1 MG/ML IJ SOLN
1.0000 mg | Freq: Once | INTRAMUSCULAR | Status: AC
  Administered 2014-05-25: 1 mg via INTRAVENOUS

## 2014-05-25 MED ORDER — SODIUM CHLORIDE 0.9 % IR SOLN
Status: DC | PRN
  Administered 2014-05-25: 1000 mL

## 2014-05-25 MED ORDER — SODIUM BICARBONATE 4 % IV SOLN
INTRAVENOUS | Status: AC
  Filled 2014-05-25: qty 5

## 2014-05-25 SURGICAL SUPPLY — 50 items
BANDAGE ELASTIC 3 VELCRO ST LF (GAUZE/BANDAGES/DRESSINGS) IMPLANT
BANDAGE ELASTIC 4 VELCRO ST LF (GAUZE/BANDAGES/DRESSINGS) ×3 IMPLANT
BNDG CMPR 9X4 STRL LF SNTH (GAUZE/BANDAGES/DRESSINGS)
BNDG CONFORM 2 STRL LF (GAUZE/BANDAGES/DRESSINGS) IMPLANT
BNDG ESMARK 4X9 LF (GAUZE/BANDAGES/DRESSINGS) IMPLANT
BNDG GAUZE ELAST 4 BULKY (GAUZE/BANDAGES/DRESSINGS) ×3 IMPLANT
CORDS BIPOLAR (ELECTRODE) IMPLANT
COVER SURGICAL LIGHT HANDLE (MISCELLANEOUS) ×3 IMPLANT
CUFF TOURNIQUET SINGLE 18IN (TOURNIQUET CUFF) ×3 IMPLANT
DECANTER SPIKE VIAL GLASS SM (MISCELLANEOUS) ×3 IMPLANT
DRAIN PENROSE 1/4X12 LTX STRL (WOUND CARE) IMPLANT
DRAPE SURG 17X23 STRL (DRAPES) ×3 IMPLANT
DRSG PAD ABDOMINAL 8X10 ST (GAUZE/BANDAGES/DRESSINGS) ×3 IMPLANT
DURAPREP 26ML APPLICATOR (WOUND CARE) ×3 IMPLANT
ELECT REM PT RETURN 9FT ADLT (ELECTROSURGICAL)
ELECTRODE REM PT RTRN 9FT ADLT (ELECTROSURGICAL) IMPLANT
GAUZE PACKING IODOFORM 1/4X15 (GAUZE/BANDAGES/DRESSINGS) ×3 IMPLANT
GAUZE PACKING IODOFORM 1/4X5 (PACKING) ×3 IMPLANT
GAUZE SPONGE 4X4 12PLY STRL (GAUZE/BANDAGES/DRESSINGS) ×3 IMPLANT
GAUZE XEROFORM 1X8 LF (GAUZE/BANDAGES/DRESSINGS) ×3 IMPLANT
GLOVE SURG SYN 8.0 (GLOVE) ×3 IMPLANT
GOWN STRL REUS W/ TWL LRG LVL3 (GOWN DISPOSABLE) ×1 IMPLANT
GOWN STRL REUS W/ TWL XL LVL3 (GOWN DISPOSABLE) ×1 IMPLANT
GOWN STRL REUS W/TWL LRG LVL3 (GOWN DISPOSABLE) ×3
GOWN STRL REUS W/TWL XL LVL3 (GOWN DISPOSABLE) ×3
HANDPIECE INTERPULSE COAX TIP (DISPOSABLE)
KIT BASIN OR (CUSTOM PROCEDURE TRAY) ×3 IMPLANT
KIT ROOM TURNOVER OR (KITS) ×3 IMPLANT
MANIFOLD NEPTUNE II (INSTRUMENTS) ×3 IMPLANT
NEEDLE HYPO 25GX1X1/2 BEV (NEEDLE) IMPLANT
NEEDLE HYPO 25X1 1.5 SAFETY (NEEDLE) IMPLANT
NS IRRIG 1000ML POUR BTL (IV SOLUTION) ×3 IMPLANT
PACK ORTHO EXTREMITY (CUSTOM PROCEDURE TRAY) ×3 IMPLANT
PAD ARMBOARD 7.5X6 YLW CONV (MISCELLANEOUS) ×3 IMPLANT
PAD CAST 4YDX4 CTTN HI CHSV (CAST SUPPLIES) ×1 IMPLANT
PADDING CAST COTTON 4X4 STRL (CAST SUPPLIES) ×3
SET HNDPC FAN SPRY TIP SCT (DISPOSABLE) IMPLANT
SPONGE LAP 18X18 X RAY DECT (DISPOSABLE) ×3 IMPLANT
SUCTION FRAZIER TIP 10 FR DISP (SUCTIONS) ×3 IMPLANT
SUT VICRYL RAPIDE 4/0 PS 2 (SUTURE) IMPLANT
SYR 20CC LL (SYRINGE) IMPLANT
SYR CONTROL 10ML LL (SYRINGE) IMPLANT
TOWEL OR 17X24 6PK STRL BLUE (TOWEL DISPOSABLE) ×3 IMPLANT
TOWEL OR 17X26 10 PK STRL BLUE (TOWEL DISPOSABLE) ×3 IMPLANT
TUBE ANAEROBIC SPECIMEN COL (MISCELLANEOUS) ×3 IMPLANT
TUBE CONNECTING 12'X1/4 (SUCTIONS) ×1
TUBE CONNECTING 12X1/4 (SUCTIONS) ×2 IMPLANT
UNDERPAD 30X30 INCONTINENT (UNDERPADS AND DIAPERS) ×3 IMPLANT
WATER STERILE IRR 1000ML POUR (IV SOLUTION) IMPLANT
YANKAUER SUCT BULB TIP NO VENT (SUCTIONS) ×3 IMPLANT

## 2014-05-25 NOTE — Progress Notes (Signed)
   05/25/14 1000  Clinical Encounter Type  Visited With Patient;Health care provider  Visit Type Psychological support;Spiritual support;Social support;Critical Care  Referral From Patient;Nurse  Spiritual Encounters  Spiritual Needs Emotional  Stress Factors  Patient Stress Factors Family relationships;Health changes;Major life changes   Chaplain was on the unit and one of the nurses said the patient wanted to talk to someone. Chaplain spend roughly 30 min visiting the patient. Patient primarily wanted to talk about her drug addiction. Patient explained that she had been a cocaine user off and on for many years. Patient explained her ailments that have hospitalized her can be attributed to her use of cocaine. Patient primarily wanted to talk about how she wishes to change and stop using cocaine. Chaplain facilitated a conversation about what substance abuse recovery looks like for the patient. Patient primarily cited her acquaintances/friends as instigators of her habit. Patient said she does not have any other relationships or community outside of other cocaine user. Patient said she is estranged from many of her family members. Another major theme of our conversation was forgiveness. Patient said she does not believe God has forgiven her for all of the bad things she has done. While patient has figured out all of the steps she would like to take in her recovery. Patient did express concrete end goals of her recovery. Patient's goal is to stop using cocaine and be well enough to help others struggling with substance abuse issues. Patient was struggling with pain during our conversation so chaplain recommended a follow up visit. Patient affirmed that a follow up visit would be desired as she feels she has more to talk about. Chaplain will follow up with patient later this afternoon. Maritta Kief, Tommi EmeryBlake R, Chaplain  11:02 AM

## 2014-05-25 NOTE — Consult Note (Signed)
Reason for Consult:right hand dorsal abscess Referring Physician: [name] is an 54 y.o. female.  HPI: with h/o FB removal dorsal aspect right hand several weeks ago now with dorsal abscess despite po abx  Past Medical History  Diagnosis Date  . CAD (coronary artery disease), native coronary artery 05/23/2014  . Insulin dependent diabetes mellitus   . Noncompliance   . Obesity (BMI 30-39.9) 05/23/2014  . Hyperlipidemia 05/23/2014  . Hypertension   . Cocaine abuse 05/23/2014    Past Surgical History  Procedure Laterality Date  . Appendectomy    . Abdominal hysterectomy    . Cholecystectomy    . Left heart catheterization with coronary angiogram N/A 05/23/2014    Procedure: LEFT HEART CATHETERIZATION WITH CORONARY ANGIOGRAM;  Surgeon: Lorretta Harp, MD;  Location: [facility] CATH LAB;  Service: Cardiovascular;  Laterality: N/A;    History reviewed. No pertinent family history.  Social History:  reports that she has been smoking Cigarettes.  She has a 45 pack-year smoking history. She has never used smokeless tobacco. She reports that she uses illicit drugs. She reports that she does not drink alcohol.  Allergies:  Allergies  Allergen Reactions  . Compazine [Prochlorperazine Edisylate] Other (See Comments)  . Avelox [Moxifloxacin Hcl In Nacl]   . Levaquin [Levofloxacin In D5w]     Medications:  Scheduled: . aspirin EC  81 mg Oral Daily  . atorvastatin  80 mg Oral q1800  .  ceFAZolin (ANCEF) IV  2 g Intravenous 3 times per day  . diltiazem  60 mg Oral 4 times per day  . gabapentin  600 mg Oral TID  . heparin subcutaneous  5,000 Units Subcutaneous 3 times per day  . insulin aspart  0-20 Units Subcutaneous TID WC  . insulin aspart  5 Units Subcutaneous TID WC  . insulin glargine  45 Units Subcutaneous QHS  . metFORMIN  500 mg Oral BID WC  . metoCLOPramide (REGLAN) injection  5 mg Intravenous 3 times per day  . naproxen  500 mg Oral BID WC  . nicotine  21 mg  Transdermal Daily  . senna-docusate  1 tablet Oral BID  . sodium chloride  10-40 mL Intracatheter Q12H    Results for orders placed or performed during the hospital encounter of 05/23/14 (from the past 48 hour(s))  Glucose, capillary     Status: None   Collection Time: 05/23/14 12:46 PM  Result Value Ref Range   Glucose-Capillary 87 70 - 99 mg/dL   Comment 1 Capillary Specimen   TSH     Status: None   Collection Time: 05/23/14  1:00 PM  Result Value Ref Range   TSH 1.220 0.350 - 4.500 uIU/mL  Troponin I-(serum)     Status: None   Collection Time: 05/23/14  1:00 PM  Result Value Ref Range   Troponin I 0.03 <0.031 ng/mL    Comment:        NO INDICATION OF MYOCARDIAL INJURY.   Glucose, capillary     Status: Abnormal   Collection Time: 05/23/14  4:09 PM  Result Value Ref Range   Glucose-Capillary 219 (H) 70 - 99 mg/dL   Comment 1 Capillary Specimen   Troponin I (q 6hr x 3)     Status: Abnormal   Collection Time: 05/23/14  6:00 PM  Result Value Ref Range   Troponin I 0.10 (H) <0.031 ng/mL    Comment:        PERSISTENTLY INCREASED TROPONIN VALUES IN  THE RANGE OF 0.04-0.49 ng/mL CAN BE SEEN IN:       -UNSTABLE ANGINA       -CONGESTIVE HEART FAILURE       -MYOCARDITIS       -CHEST TRAUMA       -ARRYHTHMIAS       -LATE PRESENTING MYOCARDIAL INFARCTION       -COPD   CLINICAL FOLLOW-UP RECOMMENDED.   Urine rapid drug screen (hosp performed)     Status: Abnormal   Collection Time: 05/23/14  7:09 PM  Result Value Ref Range   Opiates POSITIVE (A) NONE DETECTED   Cocaine POSITIVE (A) NONE DETECTED   Benzodiazepines POSITIVE (A) NONE DETECTED   Amphetamines NONE DETECTED NONE DETECTED   Tetrahydrocannabinol NONE DETECTED NONE DETECTED   Barbiturates NONE DETECTED NONE DETECTED    Comment:        DRUG SCREEN FOR MEDICAL PURPOSES ONLY.  IF CONFIRMATION IS NEEDED FOR ANY PURPOSE, NOTIFY LAB WITHIN 5 DAYS.        LOWEST DETECTABLE LIMITS FOR URINE DRUG SCREEN Drug Class        Cutoff (ng/mL) Amphetamine      1000 Barbiturate      200 Benzodiazepine   500 Tricyclics       938 Opiates          300 Cocaine          300 THC              50   Glucose, capillary     Status: Abnormal   Collection Time: 05/23/14  9:23 PM  Result Value Ref Range   Glucose-Capillary 261 (H) 70 - 99 mg/dL   Comment 1 Capillary Specimen   Heparin level (unfractionated)     Status: Abnormal   Collection Time: 05/23/14  9:30 PM  Result Value Ref Range   Heparin Unfractionated 0.20 (L) 0.30 - 0.70 IU/mL    Comment:        IF HEPARIN RESULTS ARE BELOW EXPECTED VALUES, AND PATIENT DOSAGE HAS BEEN CONFIRMED, SUGGEST FOLLOW UP TESTING OF ANTITHROMBIN III LEVELS.   Heparin level (unfractionated)     Status: Abnormal   Collection Time: 05/24/14  2:25 AM  Result Value Ref Range   Heparin Unfractionated 0.28 (L) 0.30 - 0.70 IU/mL    Comment:        IF HEPARIN RESULTS ARE BELOW EXPECTED VALUES, AND PATIENT DOSAGE HAS BEEN CONFIRMED, SUGGEST FOLLOW UP TESTING OF ANTITHROMBIN III LEVELS.   CBC     Status: Abnormal   Collection Time: 05/24/14  2:45 AM  Result Value Ref Range   WBC 11.9 (H) 4.0 - 10.5 K/uL   RBC 4.92 3.87 - 5.11 MIL/uL   Hemoglobin 13.7 12.0 - 15.0 g/dL   HCT 40.1 36.0 - 46.0 %   MCV 81.5 78.0 - 100.0 fL   MCH 27.8 26.0 - 34.0 pg   MCHC 34.2 30.0 - 36.0 g/dL   RDW 13.4 11.5 - 15.5 %   Platelets 123 (L) 150 - 400 K/uL  Lipid panel     Status: Abnormal   Collection Time: 05/24/14  2:45 AM  Result Value Ref Range   Cholesterol 215 (H) 0 - 200 mg/dL   Triglycerides 145 <150 mg/dL   HDL 46 >39 mg/dL   Total CHOL/HDL Ratio 4.7 RATIO   VLDL 29 0 - 40 mg/dL   LDL Cholesterol 140 (H) 0 - 99 mg/dL    Comment:  Total Cholesterol/HDL:CHD Risk Coronary Heart Disease Risk Table                     Men   Women  1/2 Average Risk   3.4   3.3  Average Risk       5.0   4.4  2 X Average Risk   9.6   7.1  3 X Average Risk  23.4   11.0        Use the calculated  Patient Ratio above and the CHD Risk Table to determine the patient's CHD Risk.        ATP III CLASSIFICATION (LDL):  <100     mg/dL   Optimal  100-129  mg/dL   Near or Above                    Optimal  130-159  mg/dL   Borderline  160-189  mg/dL   High  >190     mg/dL   Very High   Glucose, capillary     Status: Abnormal   Collection Time: 05/24/14  7:45 AM  Result Value Ref Range   Glucose-Capillary 332 (H) 70 - 99 mg/dL   Comment 1 Capillary Specimen   Heparin level (unfractionated)     Status: None   Collection Time: 05/24/14  8:10 AM  Result Value Ref Range   Heparin Unfractionated 0.43 0.30 - 0.70 IU/mL    Comment:        IF HEPARIN RESULTS ARE BELOW EXPECTED VALUES, AND PATIENT DOSAGE HAS BEEN CONFIRMED, SUGGEST FOLLOW UP TESTING OF ANTITHROMBIN III LEVELS.   Hemoglobin A1c     Status: Abnormal   Collection Time: 05/24/14  9:00 AM  Result Value Ref Range   Hgb A1c MFr Bld 12.1 (H) 4.8 - 5.6 %    Comment: (NOTE)         Pre-diabetes: 5.7 - 6.4         Diabetes: >6.4         Glycemic control for adults with diabetes: <7.0    Mean Plasma Glucose 301 mg/dL    Comment: (NOTE) Performed At: [facility] [place], [place] [id number] Lindon Romp MD GO:[id number]   Basic metabolic panel     Status: Abnormal   Collection Time: 05/24/14  9:00 AM  Result Value Ref Range   Sodium 136 135 - 145 mmol/L   Potassium 3.9 3.5 - 5.1 mmol/L   Chloride 102 96 - 112 mmol/L   CO2 26 19 - 32 mmol/L   Glucose, Bld 283 (H) 70 - 99 mg/dL   BUN 16 6 - 23 mg/dL   Creatinine, Ser 1.10 0.50 - 1.10 mg/dL   Calcium 9.1 8.4 - 10.5 mg/dL   GFR calc non Af Amer 56 (L) >90 mL/min   GFR calc Af Amer 65 (L) >90 mL/min    Comment: (NOTE) The eGFR has been calculated using the CKD EPI equation. This calculation has not been validated in all clinical situations. eGFR's persistently <90 mL/min signify possible Chronic Kidney Disease.    Anion gap 8 5 - 15   Glucose, capillary     Status: Abnormal   Collection Time: 05/24/14 11:10 AM  Result Value Ref Range   Glucose-Capillary 252 (H) 70 - 99 mg/dL   Comment 1 Capillary Specimen   Glucose, capillary     Status: Abnormal   Collection Time: 05/24/14  5:17 PM  Result Value Ref Range  Glucose-Capillary 225 (H) 70 - 99 mg/dL   Comment 1 Capillary Specimen   Glucose, capillary     Status: Abnormal   Collection Time: 05/24/14  9:12 PM  Result Value Ref Range   Glucose-Capillary 269 (H) 70 - 99 mg/dL  Basic metabolic panel     Status: Abnormal   Collection Time: 05/25/14  3:00 AM  Result Value Ref Range   Sodium 132 (L) 135 - 145 mmol/L   Potassium 4.3 3.5 - 5.1 mmol/L   Chloride 97 96 - 112 mmol/L   CO2 27 19 - 32 mmol/L   Glucose, Bld 348 (H) 70 - 99 mg/dL   BUN 12 6 - 23 mg/dL   Creatinine, Ser 0.80 0.50 - 1.10 mg/dL   Calcium 8.8 8.4 - 10.5 mg/dL   GFR calc non Af Amer 83 (L) >90 mL/min   GFR calc Af Amer >90 >90 mL/min    Comment: (NOTE) The eGFR has been calculated using the CKD EPI equation. This calculation has not been validated in all clinical situations. eGFR's persistently <90 mL/min signify possible Chronic Kidney Disease.    Anion gap 8 5 - 15  CBC     Status: Abnormal   Collection Time: 05/25/14  3:00 AM  Result Value Ref Range   WBC 10.5 4.0 - 10.5 K/uL   RBC 4.47 3.87 - 5.11 MIL/uL   Hemoglobin 12.3 12.0 - 15.0 g/dL   HCT 36.5 36.0 - 46.0 %   MCV 81.7 78.0 - 100.0 fL   MCH 27.5 26.0 - 34.0 pg   MCHC 33.7 30.0 - 36.0 g/dL   RDW 13.6 11.5 - 15.5 %   Platelets 110 (L) 150 - 400 K/uL    Comment: PLATELET COUNT CONFIRMED BY SMEAR SPECIMEN CHECKED FOR CLOTS   Glucose, capillary     Status: Abnormal   Collection Time: 05/25/14  7:50 AM  Result Value Ref Range   Glucose-Capillary 343 (H) 70 - 99 mg/dL    No results found.  Review of Systems  All other systems reviewed and are negative.  Blood pressure 123/56, pulse 121, temperature 99 F (37.2 C),  temperature source Oral, resp. rate 15, height 5' 4"  (1.626 m), weight 74.3 kg (163 lb 12.8 oz), SpO2 88 %. Physical Exam  Constitutional: She appears well-developed and well-nourished.  HENT:  Head: Normocephalic and atraumatic.  Musculoskeletal:       Right hand: She exhibits swelling.  Right hand dorsal abscess despite PO ABX  Skin: Skin is warm. There is erythema.  Psychiatric: She has a normal mood and affect. Her behavior is normal. Judgment and thought content normal.    Assessment/Plan: Plan I and D and cultures in [place] [facility] 05/25/2014, 11:11 AM

## 2014-05-25 NOTE — Anesthesia Preprocedure Evaluation (Addendum)
Anesthesia Evaluation  Patient identified by MRN, date of birth, ID band Patient awake    Reviewed: Allergy & Precautions, NPO status , Patient's Chart, lab work & pertinent test results, reviewed documented beta blocker date and time   Airway Mallampati: II   Neck ROM: Full    Dental  (+) Edentulous Upper, Poor Dentition, Missing, Dental Advisory Given   Pulmonary COPD COPD inhaler, Current Smoker,  breath sounds clear to auscultation        Cardiovascular hypertension, Pt. on medications CAD: CATH 05/23/2014. Rhythm:Regular  ECHO 05/23/2014  EF 25-30%, diffuse hupokin, no vegetations, CATH 05/23/2014, two vessel disease, 70% stenosis, HX resent cocaine abuse   Neuro/Psych    GI/Hepatic negative GI ROS, Neg liver ROS, (+)     substance abuse  cocaine use,   Endo/Other  diabetes, Poorly Controlled, Type 2, Insulin Dependent  Renal/GU negative Renal ROS     Musculoskeletal   Abdominal (+) + obese,   Peds  Hematology 12/36   Anesthesia Other Findings Hx cocaine abuse, EF 25-30%, Cath shows two vessel CAD, 70% blockage, slightly worse than in 2010.  RBB on EKG, troponins not significantly elevated.  Has significant abscess on hand, needs I&D.  Patient does not want MAC.  Will proceed with GA.  Obesity and poor dental hygiene, plan ET tube  Reproductive/Obstetrics                           Anesthesia Physical Anesthesia Plan  ASA: III  Anesthesia Plan: General   Post-op Pain Management:    Induction:   Airway Management Planned: Oral ETT  Additional Equipment:   Intra-op Plan:   Post-operative Plan: Extubation in OR  Informed Consent: I have reviewed the patients History and Physical, chart, labs and discussed the procedure including the risks, benefits and alternatives for the proposed anesthesia with the patient or authorized representative who has indicated his/her understanding and  acceptance.     Plan Discussed with:   Anesthesia Plan Comments:         Anesthesia Quick Evaluation

## 2014-05-25 NOTE — Transfer of Care (Signed)
Immediate Anesthesia Transfer of Care Note  Patient: Shirley Short  Procedure(s) Performed: Procedure(s): IRRIGATION AND DEBRIDEMENT EXTREMITY (Right)  Patient Location: PACU  Anesthesia Type:General  Level of Consciousness: lethargic  Airway & Oxygen Therapy: Patient Spontanous Breathing and Patient connected to face mask oxygen  Post-op Assessment: Report given to RN and Post -op Vital signs reviewed and stable  Post vital signs: Reviewed and stable  Last Vitals:  Filed Vitals:   05/25/14 1200  BP: 134/57  Pulse: 116  Temp: 37.1 C  Resp: 18    Complications: No apparent anesthesia complications

## 2014-05-25 NOTE — OR Nursing (Signed)
10 ml Lidocaine 1% with epi 1:100,000 and 1 ml Sodium Bicarb injected by surgeon at beginning of case for a total of 11 ml.

## 2014-05-25 NOTE — Progress Notes (Signed)
Pt became more agitated and anxious due to pain in R hand. Began crying and vocalizing loudly about pain and inadequate care. Threatened to leave AMA. Chaplain also in room. Attempted calming techniques and offered ice. Also gave Neproxen for pain but pt refuses to give the medication time to take effect. Requesting food and drink even after physician made NPO. Explained reasoning but pt continues to be agitated requesting more pain medication.  Dr. Tresa EndoKelly informed and one time dose of dilaudid administered. Within five minutes pt appears to be sleeping. Will continue to monitor.

## 2014-05-25 NOTE — Anesthesia Postprocedure Evaluation (Signed)
  Anesthesia Post-op Note  Patient: Shirley SimmondsDonna L Short  Procedure(s) Performed: Procedure(s): IRRIGATION AND DEBRIDEMENT EXTREMITY (Right)  Patient Location: PACU  Anesthesia Type: General   Level of Consciousness: awake, alert  and oriented  Airway and Oxygen Therapy: Patient Spontanous Breathing  Post-op Pain: mild  Post-op Assessment: Post-op Vital signs reviewed  Post-op Vital Signs: Reviewed  Last Vitals:  Filed Vitals:   05/25/14 1620  BP:   Pulse: 74  Temp:   Resp: 17    Complications: No apparent anesthesia complications

## 2014-05-25 NOTE — Progress Notes (Signed)
Subjective:  Complains of increased R hand soreness at abscess site  Objective:   Vital Signs : Filed Vitals:   05/25/14 0600 05/25/14 0752 05/25/14 0800 05/25/14 0830  BP: 158/68 116/63 128/61 155/67  Pulse: 119 127 126 119  Temp:  99 F (37.2 C)    TempSrc:  Oral    Resp: 12 16 14 16   Height:      Weight:      SpO2: 94% 96% 91% 96%    Intake/Output from previous day:  Intake/Output Summary (Last 24 hours) at 05/25/14 0852 Last data filed at 05/25/14 0800  Gross per 24 hour  Intake 2224.5 ml  Output   2250 ml  Net  -25.5 ml     I/O since admission: +2435    Wt Readings from Last 3 Encounters:  05/23/14 163 lb 12.8 oz (74.3 kg)    Medications: . amLODipine  5 mg Oral Daily  . aspirin EC  81 mg Oral Daily  . atorvastatin  80 mg Oral q1800  . cephALEXin  500 mg Oral 4 times per day  . gabapentin  600 mg Oral TID  . heparin subcutaneous  5,000 Units Subcutaneous 3 times per day  . insulin aspart  0-20 Units Subcutaneous TID WC  . insulin aspart  5 Units Subcutaneous TID WC  . insulin glargine  45 Units Subcutaneous QHS  . metoCLOPramide (REGLAN) injection  5 mg Intravenous 3 times per day  . nicotine  21 mg Transdermal Daily  . sodium chloride  10-40 mL Intracatheter Q12H    . sodium chloride 50 mL/hr at 05/25/14 0800  . DOPamine Stopped (05/24/14 2300)    Physical Exam:   General appearance: alert, cooperative and no distress Neck: no adenopathy, no carotid bruit, no JVD, supple, symmetrical, trachea midline and thyroid not enlarged, symmetric, no tenderness/mass/nodules Lungs: rhonchi which improved with coughing; no rales Heart: Tachycardic ay 233; 1/6 systolic murmur; no rubs, thrills, heaves Abdomen: mild central adiposity; BS +; nontender Extremities: area of induration and tendrness on dorsum of R hand; slightly improved from yesterday; no LE edema Pulses: 2+ and symmetric R radial cath site; stable; pulses slightly decreased; no change Skin:  Skin color, texture, turgor normal. No rashes or lesions or R hand induration ; less erythema Neurologic: Grossly normal  Psychological: anxious; concerned about pain meds   Rate: 124  Rhythm: sinus tachycardia  ECG (independently read by me): ST at 106; LBBB with repolarization changes  Lab Results:  BMP Latest Ref Rng 05/25/2014 05/24/2014 05/23/2014  Glucose 70 - 99 mg/dL 348(H) 283(H) 429(H)  BUN 6 - 23 mg/dL 12 16 10   Creatinine 0.50 - 1.10 mg/dL 0.80 1.10 0.77  Sodium 135 - 145 mmol/L 132(L) 136 132(L)  Potassium 3.5 - 5.1 mmol/L 4.3 3.9 4.1  Chloride 96 - 112 mmol/L 97 102 100  CO2 19 - 32 mmol/L 27 26 27   Calcium 8.4 - 10.5 mg/dL 8.8 9.1 9.4   Hepatic Function Latest Ref Rng 05/23/2014 04/15/2009 04/14/2009  Total Protein 6.0 - 8.3 g/dL 7.0 7.1 7.2  Albumin 3.5 - 5.2 g/dL 3.4(L) 3.5 3.5  AST 0 - 37 U/L 15 23 27   ALT 0 - 35 U/L 9 18 20   Alk Phosphatase 39 - 117 U/L 92 98 94  Total Bilirubin 0.3 - 1.2 mg/dL 0.2(L) 0.5 0.6     CBC Latest Ref Rng 05/25/2014 05/24/2014 04/15/2009  WBC 4.0 - 10.5 K/uL 10.5 11.9(H) 10.4  Hemoglobin 12.0 - 15.0 g/dL  12.3 13.7 14.5  Hematocrit 36.0 - 46.0 % 36.5 40.1 42.0  Platelets 150 - 400 K/uL 110(L) 123(L) 158      Recent Labs  05/23/14 1300 05/23/14 1800  TROPONINI 0.03 0.10*    Hepatic Function Panel  Recent Labs  05/23/14 0455  PROT 7.0  ALBUMIN 3.4*  AST 15  ALT 9  ALKPHOS 92  BILITOT 0.2*   No results for input(s): INR in the last 72 hours. BNP (last 3 results) No results for input(s): BNP in the last 8760 hours.  ProBNP (last 3 results) No results for input(s): PROBNP in the last 8760 hours.   Lipid Panel     Component Value Date/Time   CHOL 215* 05/24/2014 0245   TRIG 145 05/24/2014 0245   HDL 46 05/24/2014 0245   CHOLHDL 4.7 05/24/2014 0245   VLDL 29 05/24/2014 0245   LDLCALC 140* 05/24/2014 0245      05/23/14 CATH ANGIOGRAPHIC RESULTS:   1. Left main; normal  2. LAD; 70% smooth segmental distal. There  was a high diagonal branch which was small in caliber that had 70-80% segmental mid stenosis 3. Left circumflex; codominant and free of significant disease.  4. Right coronary artery; codominant with 75% segmental PLA stenosis. The PDA arose high in the RCA 5. Left ventriculography; RAO left ventriculogram was performed using  25 mL of Visipaque dye at 12 mL/second. The overall LVEF estimated  60 % Without wall motion abnormalities  Assessment/Plan:   Principal Problem:   Acute coronary syndrome Active Problems:   Insulin dependent diabetes mellitus   Cocaine abuse   Noncompliance   CAD (coronary artery disease), native coronary artery   Obesity (BMI 30-39.9)   COPD (chronic obstructive pulmonary disease)  1. CAD: Cath films personally reviewed by me. 2 v CAD with slight progression from 2010. Unable to give IC NTG during study to assess for coronary spasm. Currently on amlodipine ; and tachycardic.  Have weaned off dopamine. HR 124/ DC amlodipine. Will start cardizem 60 mg every 6 hrs;  If beta blocker is needed would only use bystolic with nitric oxide mediated potential for vasodilation in light of cocaine history. 2. Hypotension: resolved; BP now 155/67 3. Hyperlipidemia:  LDL 140; atorvastatin initiated. 4. R hand superficial abscess; day 2 of oral cefelexin; more tender and inflamed today will change to iv ancef and surgical consult for I &D today; will keep NPO  5. DM: not well controlled. Will check HbA1c; Now on lantus 45 u at bedtime, and novalog. Metformin had been hold for 48hrs post cath; will resume today 6. Hyponatremia; Na 132;  7. Mildly thrombocytopenic: Plt 123K yesterday, today 110K 8. LBBB 9. Tobacco abuse; again discussed smoking cessation; currently on nicotine patch 10. Cocaine use; discussed avoidance of social contacts  Will transfer to TCU today     Troy Sine, MD, [facility] 05/25/2014, 8:52 AM

## 2014-05-25 NOTE — OR Nursing (Signed)
Pt arrived in PACU 1508 and was monitored by Edmon CrapeJanna, CRNA until [phone]. CRNA at bedside treating hypotension.

## 2014-05-25 NOTE — Anesthesia Procedure Notes (Signed)
Procedure Name: LMA Insertion Date/Time: 05/25/2014 2:39 PM Performed by: Adonis HousekeeperNGELL, Parminder Cupples M Pre-anesthesia Checklist: Patient identified, Emergency Drugs available, Suction available and Patient being monitored Patient Re-evaluated:Patient Re-evaluated prior to inductionOxygen Delivery Method: Circle system utilized Preoxygenation: Pre-oxygenation with 100% oxygen Intubation Type: IV induction Ventilation: Mask ventilation without difficulty LMA: LMA inserted LMA Size: 4.0 Number of attempts: 1 Placement Confirmation: positive ETCO2 and breath sounds checked- equal and bilateral Tube secured with: Tape Dental Injury: Teeth and Oropharynx as per pre-operative assessment

## 2014-05-25 NOTE — Op Note (Signed)
See note [phone]

## 2014-05-25 NOTE — Progress Notes (Signed)
Patient underwent I and D right hand with cultures and gram stain   Wound packed open  Need to start bedside hydrotherapy daily tomorrow  Will need to see in my office next week  D/c on po abx pending gram stain and cultures

## 2014-05-25 NOTE — Progress Notes (Addendum)
Inpatient Diabetes Program Recommendations  AACE/ADA: New Consensus Statement on Inpatient Glycemic Control (2013)  Target Ranges:  Prepandial:   less than 140 mg/dL      Peak postprandial:   less than 180 mg/dL (1-2 hours)      Critically ill patients:  140 - 180 mg/dL   Results for Shirley Short, Shirley Short (MRN [id number]) as of 05/25/2014 10:42  Ref. Range 05/24/2014 07:45 05/24/2014 11:10 05/24/2014 17:17 05/24/2014 21:12 05/25/2014 07:50  Glucose-Capillary Latest Range: 70-99 mg/dL 409332 (H) 811252 (H) 914225 (H) 269 (H) 343 (H)   Diabetes history: DM2 Outpatient Diabetes medications: Lantus 40 units QHS, Humulin R 0-20 units TID with meals Current orders for Inpatient glycemic control: Lantus 45 units QHs, Novolog 0-20 units TID with meals, Novolog 5 units TID with meals, Metformin 500 mg BID  Inpatient Diabetes Program Recommendations Insulin - Basal: Please consider increasing Lantus to 50 units QHS. Insulin-Correction: Please consider ordering Novolog bedtime correction scale. Insulin - Meal Coverage: Please consider increasing meal coverage to Novolog 10 units TID with meals. Oral Agents: Metformin 500 mg BID was restarted today.  Thanks, Orlando PennerMarie Shadrick Senne, RN, MSN, CCRN, CDE Diabetes Coordinator Inpatient Diabetes Program [phone] (Team Pager) [phone] ([facility] office) [phone] [facility] office)

## 2014-05-26 ENCOUNTER — Encounter (HOSPITAL_COMMUNITY): Payer: Self-pay | Admitting: Orthopedic Surgery

## 2014-05-26 ENCOUNTER — Telehealth: Payer: Self-pay | Admitting: Cardiovascular Disease

## 2014-05-26 LAB — BASIC METABOLIC PANEL
Anion gap: 8 (ref 5–15)
BUN: 8 mg/dL (ref 6–23)
CO2: 27 mmol/L (ref 19–32)
CREATININE: 0.7 mg/dL (ref 0.50–1.10)
Calcium: 8.7 mg/dL (ref 8.4–10.5)
Chloride: 98 mmol/L (ref 96–112)
GFR calc non Af Amer: >90 mL/min (ref >90)
GLUCOSE: 207 mg/dL — AB (ref 70–99)
Potassium: 4.4 mmol/L (ref 3.5–5.1)
Sodium: 133 mmol/L — ABNORMAL LOW (ref 135–145)

## 2014-05-26 LAB — CBC WITH DIFFERENTIAL/PLATELET
Basophils Absolute: 0 10*3/uL (ref 0.0–0.1)
Basophils Relative: 0 % (ref 0–1)
EOS PCT: 2 % (ref 0–5)
Eosinophils Absolute: 0.2 10*3/uL (ref 0.0–0.7)
HEMATOCRIT: 34.6 % — AB (ref 36.0–46.0)
HEMOGLOBIN: 11.4 g/dL — AB (ref 12.0–15.0)
Lymphocytes Relative: 21 % (ref 12–46)
Lymphs Abs: 1.9 10*3/uL (ref 0.7–4.0)
MCH: 27.1 pg (ref 26.0–34.0)
MCHC: 32.9 g/dL (ref 30.0–36.0)
MCV: 82.4 fL (ref 78.0–100.0)
MONOS PCT: 6 % (ref 3–12)
Monocytes Absolute: 0.5 10*3/uL (ref 0.1–1.0)
NEUTROS PCT: 72 % (ref 43–77)
Neutro Abs: 6.7 10*3/uL (ref 1.7–7.7)
PLATELETS: 107 10*3/uL — AB (ref 150–400)
RBC: 4.2 MIL/uL (ref 3.87–5.11)
RDW: 13.7 % (ref 11.5–15.5)
WBC: 9.3 10*3/uL (ref 4.0–10.5)

## 2014-05-26 LAB — GLUCOSE, CAPILLARY
GLUCOSE-CAPILLARY: 222 mg/dL — AB (ref 70–99)
GLUCOSE-CAPILLARY: 215 mg/dL — AB (ref 70–99)
Glucose-Capillary: 168 mg/dL — ABNORMAL HIGH (ref 70–99)
Glucose-Capillary: 185 mg/dL — ABNORMAL HIGH (ref 70–99)

## 2014-05-26 MED ORDER — HYDROCODONE-ACETAMINOPHEN 5-325 MG PO TABS
1.0000 | ORAL_TABLET | Freq: Four times a day (QID) | ORAL | Status: DC | PRN
  Administered 2014-05-26 – 2014-05-27 (×3): 1 via ORAL
  Filled 2014-05-26 (×2): qty 1

## 2014-05-26 MED ORDER — HYDROCODONE-ACETAMINOPHEN 5-325 MG PO TABS
1.0000 | ORAL_TABLET | Freq: Four times a day (QID) | ORAL | Status: DC | PRN
  Filled 2014-05-26: qty 1

## 2014-05-26 MED ORDER — INSULIN ASPART 100 UNIT/ML ~~LOC~~ SOLN
0.0000 [IU] | Freq: Every day | SUBCUTANEOUS | Status: DC
  Administered 2014-05-27: 2 [IU] via SUBCUTANEOUS

## 2014-05-26 MED ORDER — DILTIAZEM HCL ER COATED BEADS 300 MG PO CP24
300.0000 mg | ORAL_CAPSULE | Freq: Every day | ORAL | Status: DC
  Administered 2014-05-27 – 2014-05-30 (×4): 300 mg via ORAL
  Filled 2014-05-26 (×4): qty 1

## 2014-05-26 NOTE — Progress Notes (Signed)
Subjective:  S/p I&D of R hand dorum abscess yesterday; c/o significant pain but looks comfortable.  Objective:   Vital Signs : Filed Vitals:   05/26/14 0400 05/26/14 0529 05/26/14 0723 05/26/14 0800  BP: 153/60 153/60 150/52 164/65  Pulse: 96  82 90  Temp: 98.6 F (37 C)  98.7 F (37.1 C)   TempSrc: Oral  Oral   Resp: 17  18 19   Height:      Weight:      SpO2: 91%  93% 100%    Intake/Output from previous day:  Intake/Output Summary (Last 24 hours) at 05/26/14 0950 Last data filed at 05/26/14 0806  Gross per 24 hour  Intake   1037 ml  Output   2025 ml  Net   -988 ml     I/O since admission: +1197  Wt Readings from Last 3 Encounters:  05/23/14 163 lb 12.8 oz (74.3 kg)    Medications: . amitriptyline  100 mg Oral QHS  . aspirin EC  81 mg Oral Daily  . atorvastatin  80 mg Oral q1800  .  ceFAZolin (ANCEF) IV  2 g Intravenous 3 times per day  . diltiazem  60 mg Oral 4 times per day  . gabapentin  300 mg Oral QHS  . gabapentin  600 mg Oral TID AC  . heparin subcutaneous  5,000 Units Subcutaneous 3 times per day  . insulin aspart  0-20 Units Subcutaneous TID WC  . insulin aspart  5 Units Subcutaneous TID WC  . insulin glargine  45 Units Subcutaneous QHS  . lisdexamfetamine  20 mg Oral q morning - 10a  . lisinopril  20 mg Oral Daily  . metFORMIN  500 mg Oral BID WC  . metoCLOPramide (REGLAN) injection  5 mg Intravenous 3 times per day  . naproxen  500 mg Oral BID WC  . nicotine  21 mg Transdermal Daily  . senna-docusate  1 tablet Oral BID  . sodium chloride  10-40 mL Intracatheter Q12H    . sodium chloride 10 mL/hr at 05/25/14 1045    Physical Exam:   General appearance: alert, cooperative and no distress Neck: no adenopathy, no carotid bruit, no JVD, supple, symmetrical, trachea midline and thyroid not enlarged, symmetric, no tenderness/mass/nodules Lungs: rhonchi which improved with coughing; no rales Heart: Tachycardic ay 370; 1/6 systolic murmur; no  rubs, thrills, heaves Abdomen: mild central adiposity; BS +; nontender Extremities: R hand arm bandaged;  no LE edema Pulses: 2+ and symmetric R radial cath site; stable; pulses slightly decreased; no change Skin: Skin color, texture, turgor normal. No rashes or lesions or R hand induration ; less erythema Neurologic: Grossly normal  Psychological: anxious; concerned about pain meds   Rate: 75, much improved from >120 yesterday  Rhythm:  NSR no ectopy  ECG (independently read by me): ST at 106; LBBB with repolarization changes; will f/u ECG today  Lab Results:  BMP Latest Ref Rng 05/26/2014 05/25/2014 05/24/2014  Glucose 70 - 99 mg/dL 207(H) 348(H) 283(H)  BUN 6 - 23 mg/dL 8 12 16   Creatinine 0.50 - 1.10 mg/dL 0.70 0.80 1.10  Sodium 135 - 145 mmol/L 133(L) 132(L) 136  Potassium 3.5 - 5.1 mmol/L 4.4 4.3 3.9  Chloride 96 - 112 mmol/L 98 97 102  CO2 19 - 32 mmol/L 27 27 26   Calcium 8.4 - 10.5 mg/dL 8.7 8.8 9.1   Hepatic Function Latest Ref Rng 05/23/2014 04/15/2009 04/14/2009  Total Protein 6.0 - 8.3 g/dL 7.0 7.1 7.2  Albumin 3.5 - 5.2 g/dL 3.4(L) 3.5 3.5  AST 0 - 37 U/L 15 23 27   ALT 0 - 35 U/L 9 18 20   Alk Phosphatase 39 - 117 U/L 92 98 94  Total Bilirubin 0.3 - 1.2 mg/dL 0.2(L) 0.5 0.6     CBC Latest Ref Rng 05/26/2014 05/25/2014 05/24/2014  WBC 4.0 - 10.5 K/uL 9.3 10.5 11.9(H)  Hemoglobin 12.0 - 15.0 g/dL 11.4(L) 12.3 13.7  Hematocrit 36.0 - 46.0 % 34.6(L) 36.5 40.1  Platelets 150 - 400 K/uL 107(L) 110(L) 123(L)      Recent Labs  05/23/14 1300 05/23/14 1800  TROPONINI 0.03 0.10*    Hepatic Function Panel No results for input(s): PROT, ALBUMIN, AST, ALT, ALKPHOS, BILITOT, BILIDIR, IBILI in the last 72 hours. No results for input(s): INR in the last 72 hours. BNP (last 3 results) No results for input(s): BNP in the last 8760 hours.  ProBNP (last 3 results) No results for input(s): PROBNP in the last 8760 hours.   Lipid Panel     Component Value Date/Time   CHOL 215*  05/24/2014 0245   TRIG 145 05/24/2014 0245   HDL 46 05/24/2014 0245   CHOLHDL 4.7 05/24/2014 0245   VLDL 29 05/24/2014 0245   LDLCALC 140* 05/24/2014 0245      05/23/14 CATH ANGIOGRAPHIC RESULTS:   1. Left main; normal  2. LAD; 70% smooth segmental distal. There was a high diagonal branch which was small in caliber that had 70-80% segmental mid stenosis 3. Left circumflex; codominant and free of significant disease.  4. Right coronary artery; codominant with 75% segmental PLA stenosis. The PDA arose high in the RCA 5. Left ventriculography; RAO left ventriculogram was performed using  25 mL of Visipaque dye at 12 mL/second. The overall LVEF estimated  60 % Without wall motion abnormalities  Assessment/Plan:   Principal Problem:   Acute coronary syndrome Active Problems:   Insulin dependent diabetes mellitus   Cocaine abuse   Noncompliance   CAD (coronary artery disease), native coronary artery   Obesity (BMI 30-39.9)   COPD (chronic obstructive pulmonary disease)  1. CAD: Cath films personally reviewed by me. 2 v CAD with slight progression from 2010. Unable to give IC NTG during study to assess for coronary spasm. HR better today with cardizem  60 mg every 6 hrs instead of amlodipine ; Will change to CD dose tomorrrow.  If beta blocker is needed would only use bystolic with nitric oxide mediated potential for vasodilation in light of cocaine history. 2. Hypertension; now on cardizem/lisinopril. Will increase and change cardizem CD 300 tomorrow.  3. Hyperlipidemia:  LDL 140; atorvastatin initiated. 4. R hand superficial abscess; underwent I&D in OR yesterday with general anesthesia; tolerated well on iv ancef. Cultures pending.  Will f/u ECG post op today 5. DM: not well controlled.  HbA1c 12.1; Now on lantus 45 u at bedtime, and novalog. Metformin had been hold for 48hrs post cath; will resume today 6. Hyponatremia; Na 133 today;  7. Mildly thrombocytopenic: Plt 123K 2 days  ago, today 107 K, on sq hep 8. LBBB 9. Tobacco abuse; again discussed smoking cessation; currently on nicotine patch 10. Cocaine use; discussed avoidance of social contacts 11. Low threshold Chronic pain; will try to dc from dilaudid; with post-op pain will give several doses of oral norco every 6 hrs then dc.      Shirley Sine, MD, [facility] 05/26/2014, 9:50 AM

## 2014-05-26 NOTE — Significant Event (Signed)
Patient screaming out " I"m in pain and I am leaving.  Paged Dr. Tresa EndoKelly requested oral pain medication and advance diet.  Dr. Stann MainlandWill be by to see patient.  Informed patient of when IV pain med was due and previous doses.  Right hand swollen encouraged patient of elevate extremity, good cap refill and strong brachial pulse.  Will continue to monitor, awaiting MD

## 2014-05-26 NOTE — Progress Notes (Signed)
Found patient OOB on floor beside BSC.  "I just had to sit on the floor, I couldn't make it back to bed." Call light was within reach at all times. Patient voided again while on floor and underwear was changed, socks, and gown. Patient stated she didn't hit any body parts, she just sat down. "When can I get my next dose of pain medication?". Assisted patient back to bed and VSS see flowsheet. "I hurt so bad I can't sleep." Patient asleep in bed before I left the room. Will continue to monitor and watch for safety issues.

## 2014-05-26 NOTE — Progress Notes (Signed)
Physical Therapy Wound Evaluation andTreatment Patient Details  Name: Shirley Short MRN: [id number] Date of Birth: 06/11/1960  Today's Date: 05/26/2014 Time: 7517-0017 Time Calculation (min): 38 min  Subjective  Subjective: I don't want you to pack that. Patient and Family Stated Goals: get me out of here Prior Treatments: s/p I &D 3/2.  Pain Score:  Pt with poor pain tolerance however falling asleep during treatment. Given medication prior to hydrotherapy.  Wound Assessment  Wound / Incision (Open or Dehisced) 05/26/14 Other (Comment) Hand Right Dorsum right hand (Active)  Dressing Type ABD;Compression wrap;Gauze (Comment) 05/26/2014 12:00 PM  Dressing Changed New 05/26/2014 12:00 PM  Dressing Status Clean;Dry;Intact 05/26/2014 12:00 PM  Dressing Change Frequency Daily 05/26/2014 12:00 PM  Site / Wound Assessment Granulation tissue;Red;Painful 05/26/2014 12:00 PM  % Wound base Red or Granulating 90% 05/26/2014 12:00 PM  % Wound base Yellow 0% 05/26/2014 12:00 PM  % Wound base Black 0% 05/26/2014 12:00 PM  % Wound base Other (Comment) 10% 05/26/2014 12:00 PM  Peri-wound Assessment Edema;Induration;Maceration;Pink;Purple 05/26/2014 12:00 PM  Wound Length (cm) 3.9 cm 05/26/2014 12:00 PM  Wound Width (cm) 0.7 cm 05/26/2014 12:00 PM  Wound Depth (cm) 1.3 cm 05/26/2014 12:00 PM  Undermining (cm) undermining 2.1 cm at 2:00; 1.8 cm at 8:00-10:00 05/26/2014 12:00 PM  Margins Unattached edges (unapproximated) 05/26/2014 12:00 PM  Closure None 05/26/2014 12:00 PM  Drainage Amount Copious 05/26/2014 12:00 PM  Drainage Description Sanguineous;Serosanguineous;No odor 05/26/2014 12:00 PM  Treatment Cleansed;Debridement (Selective);Hydrotherapy (Pulse lavage) 05/26/2014 12:00 PM     Incision (Closed) 05/25/14 Hand Right (Active)  Dressing Type Compression wrap 05/25/2014  8:00 PM  Dressing Clean;Dry;Intact 05/25/2014  8:00 PM  Drainage Amount None 05/25/2014  8:00 PM   Hydrotherapy Pulsed lavage therapy - wound location: dorsum right  hand Pulsed Lavage with Suction (psi): 4 psi Pulsed Lavage with Suction - Normal Saline Used: 1000 mL Pulsed Lavage Tip: Tip with splash shield Selective Debridement Selective Debridement - Location: dorsum right hand Selective Debridement - Tools Used: Other (comment) (Q tip)   Wound Assessment and Plan  Wound Therapy - Assess/Plan/Recommendations Wound Therapy - Clinical Statement: Pt s/p I&D 3/2 to dorsum of right hand. Visible wound bed is red, granulation tissue with exposed tendon with 2 areas of undermining. Pt would benefit from PLS to cleanse the wound, prevent further I&D's in OR, decrease bacterial load and promote wound healing. Anticipate pt will only need a few visits prior to d/c due to clean wound bed. Wound Therapy - Functional Problem List: decreased AROM digits, wrist.  Factors Delaying/Impairing Wound Healing: Infection - systemic/local;Multiple medical problems;Polypharmacy;Substance abuse Hydrotherapy Plan: Debridement;Dressing change;Patient/family education;Pulsatile lavage with suction Wound Therapy - Frequency: 6X / week Wound Therapy - Current Recommendations: WOC nurse;Surgery consult Wound Therapy - Follow Up Recommendations: Home health RN Wound Plan: see above  Wound Therapy Goals- Improve the function of patient's integumentary system by progressing the wound(s) through the phases of wound healing (inflammation - proliferation - remodeling) by: Decrease Necrotic Tissue to: <10% excluding exposed tendon Decrease Necrotic Tissue - Progress: Goal set today Increase Granulation Tissue to: 100% Increase Granulation Tissue - Progress: Goal set today Improve Drainage Characteristics: Min Improve Drainage Characteristics - Progress: Goal set today Patient/Family will be able to : performing dressing changes independently Patient/Family Instruction Goal - Progress: Goal set today Additional Wound Therapy Goal: demonstrate and verbalize AROM exercises of Rt  hand/digits. Additional Wound Therapy Goal - Progress: Goal set today Goals/treatment plan/discharge plan were made with and agreed  upon by patient/family: Yes Time For Goal Achievement: 6 days Wound Therapy - Potential for Goals: Good  Goals will be updated until maximal potential achieved or discharge criteria met.  Discharge criteria: when goals achieved, discharge from hospital, MD decision/surgical intervention, no progress towards goals, refusal/missing three consecutive treatments without notification or medical reason.  GP     Candy Sledge A 05/26/2014, 12:17 PM  Candy Sledge, [place], DPT [phone]

## 2014-05-26 NOTE — Op Note (Signed)
[name]:  Short, Shirley Short               ACCOUNT NO.:  [id number]  MEDICAL RECORD NO.:  [id number]  LOCATION:  2H15C                        FACILITY:  [facility]  PHYSICIAN:  Artist PaisMatthew A. Jamaine Quintin, M.D.DATE OF BIRTH:  08/06/1960  DATE OF PROCEDURE:  05/25/2014 DATE OF DISCHARGE:                              OPERATIVE REPORT   PREOPERATIVE DIAGNOSIS:  Right hand dorsal abscess.  POSTOPERATIVE DIAGNOSIS:  Right hand dorsal abscess.  PROCEDURE:  Incision and drainage of above.  SURGEON:  Artist PaisMatthew A. Mina MarbleWeingold, M.D.  ASSISTANT:  None.  ANESTHESIA:  General.  No complications.  No drains.  Cultures x2 sent stat Gram stain.  Wound packed open.  The patient was taken to the operating suite.  After induction of adequate general anesthetic, right upper extremity was prepped and draped in sterile fashion.  I injected 10 mL of 1% lidocaine with epinephrine 1:100,000.  1 mL of bicarb around the wound.  We then prepped and draped in usual sterile fashion.  I made dorsal midline incision of the long metacarpal.  Skin was incised.  Purulence was encountered, was cultured for aerobic, anaerobic, and stat Gram stain with tipped blunt hemostat.  Dissected flaps dorsally and volarly.  We then used a liter of normal saline to thoroughly irrigate out the wound. We then loosely packed it up with quarter-inch Iodoform gauze, 4x4s, fluffs, and compressive dressing were applied.  The patient tolerated the procedure well in a concealed fashion.     Artist PaisMatthew A. Mina MarbleWeingold, M.D.     MAW/MEDQ  D:  05/25/2014  T:  05/26/2014  Job:  [id number]

## 2014-05-26 NOTE — Telephone Encounter (Signed)
[facility]. Patient is [facility] as inpatient - appears to have had LHC & ortho surgery. Patrick NorthAdvised Jonna, RN to contact cardiologist who is rounding in the hospital today to advise on patient's inpatient care regarding diet and PO medications. Dr. Tresa EndoKelly is not in office and is in the hospital until around 3pm (informed Miachel RouxJonna, RN of this).

## 2014-05-26 NOTE — Telephone Encounter (Signed)
Miachel RouxJonna called in wanting to know if there is a recommendation for a oral pain medication in addition to her IV and she was wanting to know if she could advance the pt's diet because she is NPO. Please f/u  Thanks

## 2014-05-26 NOTE — Progress Notes (Signed)
Inpatient Diabetes Program Recommendations  AACE/ADA: New Consensus Statement on Inpatient Glycemic Control (2013)  Target Ranges:  Prepandial:   less than 140 mg/dL      Peak postprandial:   less than 180 mg/dL (1-2 hours)      Critically ill patients:  140 - 180 mg/dL   Results for Shirley Short, Shirley Short (MRN [id number]) as of 05/26/2014 08:22  Ref. Range 05/25/2014 07:50 05/25/2014 12:37 05/25/2014 21:17 05/26/2014 07:26  Glucose-Capillary Latest Range: 70-99 mg/dL 782343 (H) 956116 (H) 213146 (H) 222 (H)   Diabetes history: DM2 Outpatient Diabetes medications: Lantus 40 units QHS, Humulin R 0-20 units TID with meals Current orders for Inpatient glycemic control: Lantus 45 units QHs, Novolog 0-20 units TID with meals, Novolog 5 units TID with meals, Metformin 500 mg BID  Inpatient Diabetes Program Recommendations Insulin - Basal: Please consider increasing Lantus to 50 units QHS. Insulin - Meal Coverage: Once diet is resumed to Carb Modified diet, please consider increasing meal coverage to Novolog 10 units TID with meals. Insulin-Correction: Please consider ordering Novolog bedtime correction scale.  Note: Patient was made NPO yesterday at 9:02am and is currently still ordered to be NPO.   Thanks, Orlando PennerMarie Firas Guardado, RN, MSN, CCRN, CDE Diabetes Coordinator Inpatient Diabetes Program [phone] (Team Pager) [phone] ([facility] office) [phone] [facility] [place]([facility] office)

## 2014-05-27 DIAGNOSIS — E669 Obesity, unspecified: Secondary | ICD-10-CM

## 2014-05-27 DIAGNOSIS — I25110 Atherosclerotic heart disease of native coronary artery with unstable angina pectoris: Secondary | ICD-10-CM

## 2014-05-27 LAB — GLUCOSE, CAPILLARY
GLUCOSE-CAPILLARY: 134 mg/dL — AB (ref 70–99)
Glucose-Capillary: 255 mg/dL — ABNORMAL HIGH (ref 70–99)
Glucose-Capillary: 98 mg/dL (ref 70–99)
Glucose-Capillary: 203 mg/dL — ABNORMAL HIGH (ref 70–99)

## 2014-05-27 MED ORDER — LISINOPRIL 20 MG PO TABS
30.0000 mg | ORAL_TABLET | Freq: Every day | ORAL | Status: DC
  Administered 2014-05-27: 30 mg via ORAL
  Filled 2014-05-27 (×2): qty 1

## 2014-05-27 MED ORDER — HYDROMORPHONE HCL 1 MG/ML IJ SOLN
0.5000 mg | Freq: Once | INTRAMUSCULAR | Status: AC
  Administered 2014-05-27: 0.5 mg via INTRAVENOUS
  Filled 2014-05-27: qty 1

## 2014-05-27 MED ORDER — INSULIN GLARGINE 100 UNIT/ML ~~LOC~~ SOLN
50.0000 [IU] | Freq: Every day | SUBCUTANEOUS | Status: DC
  Administered 2014-05-27 – 2014-05-30 (×2): 50 [IU] via SUBCUTANEOUS
  Filled 2014-05-27 (×5): qty 0.5

## 2014-05-27 NOTE — Progress Notes (Signed)
Physical Therapy Wound Treatment Patient Details  Name: Shirley Short MRN: [id number] Date of Birth: 11-18-1960  Today's Date: 05/27/2014 Time: 1010-1044 Time Calculation (min): 34 min  Subjective  Subjective: Pt asleep and snoring during session Patient and Family Stated Goals: get me out of here Prior Treatments: s/p I &D 3/2.  Pain Score: Pain Score: 10-Worst pain ever pt sound asleep during session, only awakening during packing of wound.  Wound Assessment  Wound / Incision (Open or Dehisced) 05/26/14 Other (Comment) Hand Right Dorsum right hand (Active)  Dressing Type ABD;Compression wrap;Gauze (Comment) 05/27/2014 10:59 AM  Dressing Changed New 05/27/2014 10:59 AM  Dressing Status Clean;Dry;Intact 05/27/2014 10:59 AM  Dressing Change Frequency Daily 05/27/2014 10:59 AM  Site / Wound Assessment Granulation tissue;Red;Painful 05/27/2014 10:59 AM  % Wound base Red or Granulating 90% 05/27/2014 10:59 AM  % Wound base Yellow 0% 05/27/2014 10:59 AM  % Wound base Black 0% 05/27/2014 10:59 AM  % Wound base Other (Comment) 10% 05/27/2014 10:59 AM  Peri-wound Assessment Edema;Induration;Maceration;Pink;Purple 05/27/2014 10:59 AM  Wound Length (cm) 3.9 cm 05/26/2014 12:00 PM  Wound Width (cm) 0.7 cm 05/26/2014 12:00 PM  Wound Depth (cm) 1.3 cm 05/26/2014 12:00 PM  Undermining (cm) undermining 2.1 cm at 2:00; 1.8 cm at 8:00-10:00 05/26/2014 12:00 PM  Margins Unattached edges (unapproximated) 05/27/2014 10:59 AM  Closure None 05/27/2014 10:59 AM  Drainage Amount Moderate 05/27/2014 10:59 AM  Drainage Description Serosanguineous;No odor 05/27/2014 10:59 AM  Treatment Cleansed;Debridement (Selective);Hydrotherapy (Pulse lavage) 05/27/2014 10:59 AM     Incision (Closed) 05/25/14 Hand Right (Active)  Dressing Type Compression wrap 05/27/2014  9:00 AM  Dressing Clean;Dry;Intact 05/27/2014  9:00 AM  Dressing Change Frequency Daily 05/27/2014  9:00 AM  Drainage Amount None 05/27/2014  9:00 AM   Hydrotherapy Pulsed lavage therapy -  wound location: dorsum right hand Pulsed Lavage with Suction (psi): 4 psi Pulsed Lavage with Suction - Normal Saline Used: 1000 mL Pulsed Lavage Tip: Tip with splash shield Selective Debridement Selective Debridement - Location: dorsum right hand Selective Debridement - Tools Used: Other (comment) (Q tip)   Wound Assessment and Plan  Wound Therapy - Assess/Plan/Recommendations Wound Therapy - Clinical Statement: Pt s/p I&D 3/2 to dorsum of right hand. Visible wound bed is red, granulation tissue with exposed tendon with 2 areas of undermining. Pt would benefit from PLS to cleanse the wound, prevent further I&D's in OR, decrease bacterial load and promote wound healing. Increased swelling, redness and erythema around wound site today. RN notified. Anticipate pt will only need a few visits prior to d/c due to clean wound bed. Wound Therapy - Functional Problem List: decreased AROM digits, wrist.  Factors Delaying/Impairing Wound Healing: Infection - systemic/local;Multiple medical problems;Polypharmacy;Substance abuse Hydrotherapy Plan: Debridement;Dressing change;Patient/family education;Pulsatile lavage with suction Wound Therapy - Frequency: 6X / week Wound Therapy - Current Recommendations: WOC nurse;Surgery consult Wound Therapy - Follow Up Recommendations: Home health RN Wound Plan: see above  Wound Therapy Goals- Improve the function of patient's integumentary system by progressing the wound(s) through the phases of wound healing (inflammation - proliferation - remodeling) by: Decrease Necrotic Tissue to: <10% excluding exposed tendon Decrease Necrotic Tissue - Progress: Progressing toward goal Increase Granulation Tissue to: 100% Increase Granulation Tissue - Progress: Progressing toward goal Improve Drainage Characteristics: Min Improve Drainage Characteristics - Progress: Progressing toward goal Patient/Family will be able to : performing dressing changes  independently Patient/Family Instruction Goal - Progress: Progressing toward goal Additional Wound Therapy Goal: demonstrate and verbalize AROM exercises of Rt  hand/digits. Goals/treatment plan/discharge plan were made with and agreed upon by patient/family: Yes Time For Goal Achievement: 6 days Wound Therapy - Potential for Goals: Good  Goals will be updated until maximal potential achieved or discharge criteria met.  Discharge criteria: when goals achieved, discharge from hospital, MD decision/surgical intervention, no progress towards goals, refusal/missing three consecutive treatments without notification or medical reason.  GP     Candy Sledge A 05/27/2014, 11:02 AM  Candy Sledge, PT, DPT [phone]

## 2014-05-27 NOTE — Progress Notes (Signed)
Subjective:  Sleeping, had received dilaudid earlier  Objective:   Vital Signs : Filed Vitals:   05/27/14 0000 05/27/14 0300 05/27/14 0400 05/27/14 0745  BP: 121/49 145/46 146/50 161/57  Pulse: 68 66  84  Temp: 98.5 F (36.9 C) 97.8 F (36.6 C)  98.5 F (36.9 C)  TempSrc: Oral Oral  Oral  Resp: 12 10 11 14   Height:      Weight:      SpO2: 94% 98%  98%    Intake/Output from previous day:  Intake/Output Summary (Last 24 hours) at 05/27/14 1052 Last data filed at 05/27/14 0900  Gross per 24 hour  Intake 1415.83 ml  Output   1800 ml  Net -384.17 ml     I/O since admission: +573  Wt Readings from Last 3 Encounters:  05/23/14 163 lb 12.8 oz (74.3 kg)    Medications: . amitriptyline  100 mg Oral QHS  . aspirin EC  81 mg Oral Daily  . atorvastatin  80 mg Oral q1800  .  ceFAZolin (ANCEF) IV  2 g Intravenous 3 times per day  . diltiazem  300 mg Oral Daily  . gabapentin  300 mg Oral QHS  . gabapentin  600 mg Oral TID AC  . heparin subcutaneous  5,000 Units Subcutaneous 3 times per day  . insulin aspart  0-20 Units Subcutaneous TID WC  . insulin aspart  0-5 Units Subcutaneous QHS  . insulin aspart  5 Units Subcutaneous TID WC  . insulin glargine  45 Units Subcutaneous QHS  . lisdexamfetamine  20 mg Oral q morning - 10a  . lisinopril  20 mg Oral Daily  . metFORMIN  500 mg Oral BID WC  . metoCLOPramide (REGLAN) injection  5 mg Intravenous 3 times per day  . naproxen  500 mg Oral BID WC  . nicotine  21 mg Transdermal Daily  . senna-docusate  1 tablet Oral BID  . sodium chloride  10-40 mL Intracatheter Q12H    . sodium chloride 10 mL/hr at 05/26/14 2000    Physical Exam:   General appearance: alert, cooperative and no distress Neck: no adenopathy, no carotid bruit, no JVD, supple, symmetrical, trachea midline and thyroid not enlarged, symmetric, no tenderness/mass/nodules Lungs: rhonchi which improved with coughing; no rales Heart: RRR 71; 1/6 systolic murmur;  no rubs, thrills, heaves Abdomen: mild central adiposity; BS +; nontender Extremities: R hand arm bandaged;  no LE edema Pulses: 2+ and symmetric R radial cath site; stable; pulses slightly decreased; no change Neurologic: Grossly normal  Psychological: anxious; concerned about pain meds   Rate: 75, much improved from >120 yesterday  Rhythm:  NSR no ectopy  ECG (independently read by me): NSR at 78 with inferior and anterior T wave inversion.  Resolution of LBBB.  Lab Results:  BMP Latest Ref Rng 05/26/2014 05/25/2014 05/24/2014  Glucose 70 - 99 mg/dL 207(H) 348(H) 283(H)  BUN 6 - 23 mg/dL 8 12 16   Creatinine 0.50 - 1.10 mg/dL 0.70 0.80 1.10  Sodium 135 - 145 mmol/L 133(L) 132(L) 136  Potassium 3.5 - 5.1 mmol/L 4.4 4.3 3.9  Chloride 96 - 112 mmol/L 98 97 102  CO2 19 - 32 mmol/L 27 27 26   Calcium 8.4 - 10.5 mg/dL 8.7 8.8 9.1   Hepatic Function Latest Ref Rng 05/23/2014 04/15/2009 04/14/2009  Total Protein 6.0 - 8.3 g/dL 7.0 7.1 7.2  Albumin 3.5 - 5.2 g/dL 3.4(L) 3.5 3.5  AST 0 - 37 U/L 15 23 27  ALT 0 - 35 U/L 9 18 20   Alk Phosphatase 39 - 117 U/L 92 98 94  Total Bilirubin 0.3 - 1.2 mg/dL 0.2(L) 0.5 0.6     CBC Latest Ref Rng 05/26/2014 05/25/2014 05/24/2014  WBC 4.0 - 10.5 K/uL 9.3 10.5 11.9(H)  Hemoglobin 12.0 - 15.0 g/dL 11.4(L) 12.3 13.7  Hematocrit 36.0 - 46.0 % 34.6(L) 36.5 40.1  Platelets 150 - 400 K/uL 107(L) 110(L) 123(L)     No results for input(s): TROPONINI in the last 72 hours.  Invalid input(s): CK, MB  Hepatic Function Panel No results for input(s): PROT, ALBUMIN, AST, ALT, ALKPHOS, BILITOT, BILIDIR, IBILI in the last 72 hours. No results for input(s): INR in the last 72 hours. BNP (last 3 results) No results for input(s): BNP in the last 8760 hours.  ProBNP (last 3 results) No results for input(s): PROBNP in the last 8760 hours.   Lipid Panel     Component Value Date/Time   CHOL 215* 05/24/2014 0245   TRIG 145 05/24/2014 0245   HDL 46 05/24/2014 0245    CHOLHDL 4.7 05/24/2014 0245   VLDL 29 05/24/2014 0245   LDLCALC 140* 05/24/2014 0245      05/23/14 CATH ANGIOGRAPHIC RESULTS:   1. Left main; normal  2. LAD; 70% smooth segmental distal. There was a high diagonal branch which was small in caliber that had 70-80% segmental mid stenosis 3. Left circumflex; codominant and free of significant disease.  4. Right coronary artery; codominant with 75% segmental PLA stenosis. The PDA arose high in the RCA 5. Left ventriculography; RAO left ventriculogram was performed using  25 mL of Visipaque dye at 12 mL/second. The overall LVEF estimated  60 % Without wall motion abnormalities  Assessment/Plan:   Principal Problem:   Acute coronary syndrome Active Problems:   Insulin dependent diabetes mellitus   Cocaine abuse   Noncompliance   CAD (coronary artery disease), native coronary artery   Obesity (BMI 30-39.9)   COPD (chronic obstructive pulmonary disease)  1. CAD: Cath films personally reviewed by me. 2 v CAD with slight progression from 2010. Unable to give IC NTG during study to assess for coronary spasm. HR better now on cardizem CD 300 mg CD after having received 60 mg q 6 for 2 days.  If beta blocker is needed would only use bystolic with nitric oxide mediated potential for vasodilation in light of cocaine history. 2. Hypertension; now on cardizem/lisinopril. Will titrate lisinopril to 30 mg daily. 3. Hyperlipidemia:  LDL 140; atorvastatin initiated. 4. R hand superficial abscess; underwent I&D in OR 3/2 with general anesthesia; tolerated well on iv ancef. Cultures positive for staph aureus; await sensitivities to switch to oral antibiotics 5. DM: not well controlled.  HbA1c 12.1; will increase lantus insulin to 50 units. 6. Hyponatremia; will f/u BMET tomorrow 7. Mildly thrombocytopenic: Plt 123K 2 days ago,  Yesterday 107 K, on sq hep; f/u lab tomorrow 8. LBBB; now resolved on todays ECG; T ave inversion anteriorly and  inferiorly 9. Tobacco abuse; again discussed smoking cessation; currently on nicotine patch 10. Cocaine use; discussed avoidance of social contacts 11. Low threshold Chronic pain; will dc dilaudid; with post-op pain will give several doses of oral norco every 6 hrs then dc. 12. Prior vomiting resolved; will dc reglan      Troy Sine, MD, [facility] 05/27/2014, 10:52 AM

## 2014-05-27 NOTE — Progress Notes (Signed)
Inpatient Diabetes Program Recommendations  AACE/ADA: New Consensus Statement on Inpatient Glycemic Control (2013)  Target Ranges:  Prepandial:   less than 140 mg/dL      Peak postprandial:   less than 180 mg/dL (1-2 hours)      Critically ill patients:  140 - 180 mg/dL   Reason for Visit: Hyperglycemia  Results for [name], [name] (MRN [id number]) as of 05/27/2014 10:42  Ref. Range 05/26/2014 07:26 05/26/2014 11:15 05/26/2014 16:17 05/26/2014 21:17 05/27/2014 07:46  Glucose-Capillary Latest Range: 70-99 mg/dL 409222 (H) 811215 (H) 914168 (H) 185 (H) 255 (H)   Would benefit from increase in Lantus with FBS > 180.  Consider increasing Lantus to 50 units QHS.   Thank you. Ailene Ardshonda Jaymien Landin, RD, LDN, CDE Inpatient Diabetes Coordinator [phone]

## 2014-05-28 LAB — CBC
HEMATOCRIT: 36.1 % (ref 36.0–46.0)
HEMOGLOBIN: 11.7 g/dL — AB (ref 12.0–15.0)
MCH: 27 pg (ref 26.0–34.0)
MCHC: 32.4 g/dL (ref 30.0–36.0)
MCV: 83.4 fL (ref 78.0–100.0)
PLATELETS: 159 10*3/uL (ref 150–400)
RBC: 4.33 MIL/uL (ref 3.87–5.11)
RDW: 13.7 % (ref 11.5–15.5)
WBC: 8 10*3/uL (ref 4.0–10.5)

## 2014-05-28 LAB — BASIC METABOLIC PANEL
ANION GAP: 8 (ref 5–15)
BUN: 10 mg/dL (ref 6–23)
CALCIUM: 9.3 mg/dL (ref 8.4–10.5)
CO2: 32 mmol/L (ref 19–32)
CREATININE: 0.76 mg/dL (ref 0.50–1.10)
Chloride: 97 mmol/L (ref 96–112)
GFR calc Af Amer: >90 mL/min (ref >90)
GFR calc non Af Amer: >90 mL/min (ref >90)
Glucose, Bld: 220 mg/dL — ABNORMAL HIGH (ref 70–99)
Potassium: 4.5 mmol/L (ref 3.5–5.1)
Sodium: 137 mmol/L (ref 135–145)

## 2014-05-28 LAB — GLUCOSE, CAPILLARY
Glucose-Capillary: 228 mg/dL — ABNORMAL HIGH (ref 70–99)
Glucose-Capillary: 232 mg/dL — ABNORMAL HIGH (ref 70–99)
Glucose-Capillary: 112 mg/dL — ABNORMAL HIGH (ref 70–99)
Glucose-Capillary: 174 mg/dL — ABNORMAL HIGH (ref 70–99)

## 2014-05-28 LAB — WOUND CULTURE: Gram Stain: NONE SEEN

## 2014-05-28 MED ORDER — LISINOPRIL 40 MG PO TABS
40.0000 mg | ORAL_TABLET | Freq: Every day | ORAL | Status: DC
  Administered 2014-05-28 – 2014-05-31 (×4): 40 mg via ORAL
  Filled 2014-05-28 (×4): qty 1

## 2014-05-28 MED ORDER — HYDROCODONE-ACETAMINOPHEN 5-325 MG PO TABS
1.0000 | ORAL_TABLET | Freq: Four times a day (QID) | ORAL | Status: AC | PRN
  Administered 2014-05-28 – 2014-05-29 (×2): 2 via ORAL
  Filled 2014-05-28: qty 1
  Filled 2014-05-28: qty 2

## 2014-05-28 MED ORDER — HYDROCODONE-ACETAMINOPHEN 5-325 MG PO TABS
1.0000 | ORAL_TABLET | Freq: Four times a day (QID) | ORAL | Status: DC | PRN
  Administered 2014-05-28 (×2): 1 via ORAL
  Filled 2014-05-28 (×3): qty 1

## 2014-05-28 NOTE — Progress Notes (Signed)
Pt called front desk asking for pain meds.  RN found Pt in bed snoring.  Aroused by voice, and Pt complains of chest pain.  Reminded that next pain meds not available till 1630.  Pt wanting the MD called, RN started to explain this is not prudent and interrupted by land line ringing.  Pt answered phone and  RN left room.  Will con't plan of care as able.

## 2014-05-28 NOTE — Progress Notes (Signed)
IN TO ASSESS PATIENT. PATIENT C/O RUE PAIN RATED AN 8 OUT OF 10. HAND AND FOREARM WRAPPED IN ACE BANDAGE. FINGERS SLIGHTLY SWOLLEN.  PATIENT ALSO STATES THAT SHE FEELS SUICIDAL. FURTHER EXPLAINS THAT SHE DOES NOT HAVE SUFFICIENT HELP AT HOME IN [place], [place]. AND THAT SHE IS UNABLE TO REACH ANYONE BACK HOME. PATIENT STATES THAT SHE HAS A PLAN TO TAKE "SAVED" PILLS FOR A PAINLESS DEATH IF SHE CANNOT COPE.  PATIENT OFFERED EMOTIONAL SUPPORT AND ENCOURAGEMENT. CARDIOLOGY TEAM NOTIFIED OF THE ABOVE.  STAFF DIRECTLY OUTSIDE OF PATIENT'S ROOM FOR FREQUENT OBSERVATION.

## 2014-05-28 NOTE — Progress Notes (Signed)
Patient refused cefazolin this morning. NP aware.

## 2014-05-28 NOTE — Progress Notes (Signed)
Patient ID: Shirley Short, female   DOB: Sep 16, 1960, 54 y.o.   MRN: [id number]   Subjective:  Chronic dull chest ache, severe pain right hand.   Objective:   Vital Signs : Filed Vitals:   05/27/14 2000 05/27/14 2300 05/28/14 0338 05/28/14 0726  BP: 114/36 155/51 167/61 183/61  Pulse:  62    Temp:  97.3 F (36.3 C)  97.8 F (36.6 C)  TempSrc:  Oral  Oral  Resp: 15 18 16    Height:      Weight:      SpO2:  94%  94%    Intake/Output from previous day:  Intake/Output Summary (Last 24 hours) at 05/28/14 0946 Last data filed at 05/28/14 0800  Gross per 24 hour  Intake    340 ml  Output   3450 ml  Net  -3110 ml     I/O since admission: +573  Wt Readings from Last 3 Encounters:  05/23/14 163 lb 12.8 oz (74.3 kg)    Medications: . amitriptyline  100 mg Oral QHS  . aspirin EC  81 mg Oral Daily  . atorvastatin  80 mg Oral q1800  .  ceFAZolin (ANCEF) IV  2 g Intravenous 3 times per day  . diltiazem  300 mg Oral Daily  . gabapentin  300 mg Oral QHS  . gabapentin  600 mg Oral TID AC  . heparin subcutaneous  5,000 Units Subcutaneous 3 times per day  . insulin aspart  0-20 Units Subcutaneous TID WC  . insulin aspart  0-5 Units Subcutaneous QHS  . insulin aspart  5 Units Subcutaneous TID WC  . insulin glargine  50 Units Subcutaneous QHS  . lisdexamfetamine  20 mg Oral q morning - 10a  . lisinopril  40 mg Oral Daily  . metFORMIN  500 mg Oral BID WC  . naproxen  500 mg Oral BID WC  . nicotine  21 mg Transdermal Daily  . senna-docusate  1 tablet Oral BID  . sodium chloride  10-40 mL Intracatheter Q12H    . sodium chloride 10 mL/hr at 05/26/14 2000    Physical Exam:   General appearance: alert, cooperative and no distress Neck: no adenopathy, no carotid bruit, no JVD, supple, symmetrical, trachea midline and thyroid not enlarged, symmetric, no tenderness/mass/nodules Lungs: CTAB anteriorly Heart: RRR 71; 1/6 systolic murmur; no rubs, thrills, heaves Abdomen: mild  central adiposity; BS +; nontender Extremities: R hand arm bandaged;  no LE edema Neurologic: Grossly normal  Psychological: anxious; concerned about pain meds  Rhythm:  NSR no ectopy  Lab Results:  BMP Latest Ref Rng 05/28/2014 05/26/2014 05/25/2014  Glucose 70 - 99 mg/dL 220(H) 207(H) 348(H)  BUN 6 - 23 mg/dL 10 8 12   Creatinine 0.50 - 1.10 mg/dL 0.76 0.70 0.80  Sodium 135 - 145 mmol/L 137 133(L) 132(L)  Potassium 3.5 - 5.1 mmol/L 4.5 4.4 4.3  Chloride 96 - 112 mmol/L 97 98 97  CO2 19 - 32 mmol/L 32 27 27  Calcium 8.4 - 10.5 mg/dL 9.3 8.7 8.8   Hepatic Function Latest Ref Rng 05/23/2014 04/15/2009 04/14/2009  Total Protein 6.0 - 8.3 g/dL 7.0 7.1 7.2  Albumin 3.5 - 5.2 g/dL 3.4(L) 3.5 3.5  AST 0 - 37 U/L 15 23 27   ALT 0 - 35 U/L 9 18 20   Alk Phosphatase 39 - 117 U/L 92 98 94  Total Bilirubin 0.3 - 1.2 mg/dL 0.2(L) 0.5 0.6     CBC Latest Ref Rng 05/28/2014 05/26/2014 05/25/2014  WBC 4.0 - 10.5 K/uL 8.0 9.3 10.5  Hemoglobin 12.0 - 15.0 g/dL 11.7(L) 11.4(L) 12.3  Hematocrit 36.0 - 46.0 % 36.1 34.6(L) 36.5  Platelets 150 - 400 K/uL 159 107(L) 110(L)     No results for input(s): TROPONINI in the last 72 hours.  Invalid input(s): CK, MB  Hepatic Function Panel No results for input(s): PROT, ALBUMIN, AST, ALT, ALKPHOS, BILITOT, BILIDIR, IBILI in the last 72 hours. No results for input(s): INR in the last 72 hours. BNP (last 3 results) No results for input(s): BNP in the last 8760 hours.  ProBNP (last 3 results) No results for input(s): PROBNP in the last 8760 hours.   Lipid Panel     Component Value Date/Time   CHOL 215* 05/24/2014 0245   TRIG 145 05/24/2014 0245   HDL 46 05/24/2014 0245   CHOLHDL 4.7 05/24/2014 0245   VLDL 29 05/24/2014 0245   LDLCALC 140* 05/24/2014 0245      05/23/14 CATH ANGIOGRAPHIC RESULTS:   1. Left main; normal  2. LAD; 70% smooth segmental distal. There was a high diagonal branch which was small in caliber that had 70-80% segmental mid  stenosis 3. Left circumflex; codominant and free of significant disease.  4. Right coronary artery; codominant with 75% segmental PLA stenosis. The PDA arose high in the RCA 5. Left ventriculography; RAO left ventriculogram was performed using  25 mL of Visipaque dye at 12 mL/second. The overall LVEF estimated  60 % Without wall motion abnormalities  Assessment/Plan:  1. CAD:  Admitted with chest pain, 1 elevated troponin to 0.1. [facility] showed 2 v CAD with slight progression from 2010. Unable to give IC NTG during study to assess for coronary spasm. HR better now on cardizem CD 300 mg CD.  If beta blocker is needed would only use Bystolic with nitric oxide mediated potential for vasodilation in light of cocaine history.  Statin started. 2. Hypertension: Now on cardizem/lisinopril. Will titrate lisinopril to 40 mg daily, BP still high. 3. Hyperlipidemia:  LDL 140; atorvastatin initiated. 4. R hand superficial abscess: Underwent I&D in OR 3/2 with general anesthesia; now on IV Ancef. Cultures positive for staph aureus; still awaiting sensitivities to switch to oral antibiotics.  5. DM: Not well controlled.  HbA1c 12.1.  Have adjusted meds.  6. Tobacco abuse: again discussed smoking cessation; currently on nicotine patch 7. Cocaine use: Discussed avoidance of social contacts 8. Chronic pain: May get vicodin. 9. Transfer to floor, anticipate home ?perhaps tomorrow.   Loralie Champagne   05/28/2014, 9:46 AM

## 2014-05-28 NOTE — Progress Notes (Signed)
Physical Therapy Wound Treatment Patient Details  Name: Shirley Short MRN: [id number] Date of Birth: 1961-01-19  Today's Date: 05/28/2014 Time: 2130-8657 Time Calculation (min): 24 min  Subjective  Patient and Family Stated Goals: get me out of here Prior Treatments: s/p I &D 3/2.  Pain Score: Pain Score: Asleep  Wound Assessment  Wound / Incision (Open or Dehisced) 05/26/14 Other (Comment) Hand Right Dorsum right hand (Active)  Dressing Type ABD;Compression wrap;Gauze (Comment) 05/28/2014 11:00 AM  Dressing Changed New 05/28/2014 11:00 AM  Dressing Status Clean;Dry;Intact 05/28/2014 11:00 AM  Dressing Change Frequency Daily 05/28/2014 11:00 AM  Site / Wound Assessment Granulation tissue;Red;Painful 05/28/2014 11:00 AM  % Wound base Red or Granulating 90% 05/28/2014 11:00 AM  % Wound base Yellow 0% 05/28/2014 11:00 AM  % Wound base Black 0% 05/28/2014 11:00 AM  % Wound base Other (Comment) 10% 05/27/2014 10:59 AM  Peri-wound Assessment Edema;Induration;Maceration;Pink;Purple 05/28/2014 11:00 AM  Wound Length (cm) 3.9 cm 05/26/2014 12:00 PM  Wound Width (cm) 0.7 cm 05/26/2014 12:00 PM  Wound Depth (cm) 1.3 cm 05/26/2014 12:00 PM  Undermining (cm) undermining 2.1 cm at 2:00; 1.8 cm at 8:00-10:00 05/26/2014 12:00 PM  Margins Unattached edges (unapproximated) 05/28/2014 11:00 AM  Closure None 05/28/2014 11:00 AM  Drainage Amount Moderate 05/28/2014 11:00 AM  Drainage Description Serosanguineous;No odor 05/28/2014 11:00 AM  Treatment Cleansed;Hydrotherapy (Pulse lavage);Packing (Plain strip) 05/28/2014 11:00 AM     Incision (Closed) 05/25/14 Hand Right (Active)  Dressing Type Compression wrap 05/28/2014 11:40 AM  Dressing Clean;Dry;Intact 05/28/2014 11:40 AM  Dressing Change Frequency Daily 05/27/2014  9:00 AM  Site / Wound Assessment Other (Comment) 05/28/2014 11:40 AM  Drainage Amount None 05/27/2014 10:00 PM   Hydrotherapy Pulsed lavage therapy - wound location: dorsum right hand Pulsed Lavage with Suction (psi): 4  psi Pulsed Lavage with Suction - Normal Saline Used: 1000 mL Pulsed Lavage Tip: Tip with splash shield Selective Debridement Selective Debridement - Location: dorsum right hand Selective Debridement - Tools Used: Other (comment) (slough by qtip) Selective Debridement - Tissue Removed: slough   Wound Assessment and Plan  Wound Therapy - Assess/Plan/Recommendations Wound Therapy - Clinical Statement: Pt s/p I&D 3/2 to dorsum of right hand. Visible wound bed is red, granulation tissue with exposed tendon with 2 areas of undermining. Pt would benefit from PLS to cleanse the wound, prevent further I&D's in OR, decrease bacterial load and promote wound healing. Increased swelling, redness and erythema around wound site today. RN notified. Anticipate pt will only need a few visits prior to d/c due to clean wound bed. Wound Therapy - Functional Problem List: decreased AROM digits, wrist.  Factors Delaying/Impairing Wound Healing: Infection - systemic/local;Multiple medical problems;Polypharmacy;Substance abuse Hydrotherapy Plan: Debridement;Dressing change;Patient/family education;Pulsatile lavage with suction Wound Therapy - Frequency: 6X / week Wound Therapy - Current Recommendations: WOC nurse;Surgery consult Wound Therapy - Follow Up Recommendations: Home health RN Wound Plan: see above  Wound Therapy Goals- Improve the function of patient's integumentary system by progressing the wound(s) through the phases of wound healing (inflammation - proliferation - remodeling) by: Decrease Necrotic Tissue to: <10% excluding exposed tendon Decrease Necrotic Tissue - Progress: Progressing toward goal Increase Granulation Tissue to: 100% Increase Granulation Tissue - Progress: Progressing toward goal Improve Drainage Characteristics: Min Improve Drainage Characteristics - Progress: Progressing toward goal Patient/Family will be able to : performing dressing changes independently Patient/Family  Instruction Goal - Progress: Not met Additional Wound Therapy Goal: demonstrate and verbalize AROM exercises of [name]. Additional Wound Therapy Goal -  Progress: Not met Goals/treatment plan/discharge plan were made with and agreed upon by patient/family: Yes Time For Goal Achievement: 6 days Wound Therapy - Potential for Goals: Good  Goals will be updated until maximal potential achieved or discharge criteria met.  Discharge criteria: when goals achieved, discharge from hospital, MD decision/surgical intervention, no progress towards goals, refusal/missing three consecutive treatments without notification or medical reason.  GP     Kym Scannell, Tessie Fass 05/28/2014, 11:56 AM 05/28/2014  Donnella Sham, [place] [phone]  (pager)

## 2014-05-28 NOTE — Progress Notes (Signed)
Patient having 10/10 pain in right hand. She wants pain medication. She is threatening to leave AMA if she does not get pain medication.  I discussed with her pain medication possibilities and she refuses to consider any pain medication other than Dilaudid. I educated the patient on the risks of leaving AMA considering her antibiotic treatment for her hand and her STEMI diagnosis upon admission. Patient verbalizes understanding of risks but states she does not care. She states if not given pain medication she will leave the hospital and find drugs off the street to control her pain or she will come to the ED to seek pain medication. MD made aware. One time dose of Dilaudid 0.5mg  ordered. Will continue to monitor.

## 2014-05-28 NOTE — Progress Notes (Signed)
Patient agitated. Patient threatening to leave AMA if she is not allowed to smoke. She states that she will smoke in the bathroom if we will not take her outside to smoke. Educated patient on why she can't smoke in the hospital and offered to discuss nicotine patch revision with the doctor, which she refused. Will continue to monitor.

## 2014-05-28 NOTE — Progress Notes (Signed)
Patient now resting in bed, eating crackers and drinking diet coke, AFTER c/o chest pain. EKG performed as precaution. Patient refused SL nitro. Patient advised to refrain from making excessive phone calls to stressful sources and to rest.  Patient now requesting the number to a shelter. Social work consult offered.

## 2014-05-28 NOTE — Progress Notes (Signed)
SITTER NOW PRESENT WITH PATIENT. ROOM AND BELONGINGS SEARCHED FOR HAZARDOUS ITEMS. REMOVED PERSONAL ITEMS BAGGED AND PLACED IN STORAGE CONTAINER IN NURSES' STATION.

## 2014-05-29 DIAGNOSIS — R45851 Suicidal ideations: Secondary | ICD-10-CM

## 2014-05-29 DIAGNOSIS — F333 Major depressive disorder, recurrent, severe with psychotic symptoms: Secondary | ICD-10-CM

## 2014-05-29 LAB — GLUCOSE, CAPILLARY
GLUCOSE-CAPILLARY: 81 mg/dL (ref 70–99)
GLUCOSE-CAPILLARY: 151 mg/dL — AB (ref 70–99)
Glucose-Capillary: 135 mg/dL — ABNORMAL HIGH (ref 70–99)
Glucose-Capillary: 85 mg/dL (ref 70–99)

## 2014-05-29 LAB — BASIC METABOLIC PANEL
Anion gap: 4 — ABNORMAL LOW (ref 5–15)
BUN: 8 mg/dL (ref 6–23)
CO2: 31 mmol/L (ref 19–32)
CREATININE: 0.59 mg/dL (ref 0.50–1.10)
Calcium: 9.2 mg/dL (ref 8.4–10.5)
Chloride: 101 mmol/L (ref 96–112)
GFR calc non Af Amer: >90 mL/min (ref >90)
Glucose, Bld: 152 mg/dL — ABNORMAL HIGH (ref 70–99)
POTASSIUM: 4.7 mmol/L (ref 3.5–5.1)
Sodium: 136 mmol/L (ref 135–145)

## 2014-05-29 MED ORDER — DOXYCYCLINE HYCLATE 100 MG PO TABS
100.0000 mg | ORAL_TABLET | Freq: Two times a day (BID) | ORAL | Status: DC
  Administered 2014-05-29 – 2014-05-31 (×5): 100 mg via ORAL
  Filled 2014-05-29 (×6): qty 1

## 2014-05-29 MED ORDER — HYDROCODONE-ACETAMINOPHEN 5-325 MG PO TABS
1.0000 | ORAL_TABLET | Freq: Four times a day (QID) | ORAL | Status: DC | PRN
  Administered 2014-05-29 – 2014-05-31 (×7): 1 via ORAL
  Filled 2014-05-29 (×7): qty 1

## 2014-05-29 NOTE — Consult Note (Signed)
[facility] Face-to-Face Psychiatry Consult   Reason for Consult:  Suicidal thoughts Referring Physician:  Dr Tamsen Snider Patient Identification: Shirley Short MRN:  [id number] Principal Diagnosis: MDD (major depressive disorder), recurrent, severe, with psychosis Diagnosis:   Patient Active Problem List   Diagnosis Date Noted  . Cocaine abuse [F14.10] 05/23/2014  . CAD (coronary artery disease), native coronary artery [I25.10] 05/23/2014  . Obesity (BMI 30-39.9) [E66.9] 05/23/2014  . COPD (chronic obstructive pulmonary disease) [J44.9] 05/23/2014  . Hyperlipidemia [E78.5] 05/23/2014  . Acute coronary syndrome [I24.9] 05/23/2014  . Insulin dependent diabetes mellitus [E11.9, Z79.4]   . Noncompliance [Z91.19]   . Tobacco abuse [Z72.0]   . Hypertension [I10]     Total Time spent with patient: 45 minutes  Subjective:   INDRIA BISHARA is a 54 y.o. female patient admitted with multiple health issues.Marland Kitchen  HPI:  Patient seen chart reviewed patient is 54 year old Caucasian single female who was admitted because of multiple health issues and on the floor she mentioned about suicidal thoughts.  Patient is very labile and upset.  She mentioned that she is feeling hopeless helpless and she has planned to kill herself.  She is saving her Xanax and amitriptyline which are given by her primary care physician.  Patient mentioned that she's feeling depressed and having issues with sleep and controlling her anger.  She also endorses having auditory hallucination for past 4 weeks she did not describe the details.  Patient has history of at least 10 times inpatient treatment.  She has taken overdose on Seroquel in the past.  She also tried to kill herself by using razor blades in the past.  Patient lives by herself but she has a lot of family issues.  She wants to stay with her father but her father does not let her stay.  She has a daughter who live in [place] and she has to son but patient has no contact with them.   She is seeing Dr. Starling Manns 2 months ago but she admitted that she has not disclosed her suicidal plan to the psychiatrist.  She admitted with lately more irritable, angry, poor sleep, anhedonia and having severe mood swings and anger issues.  She could not contract for safety.  In the past she has taken Depakote and Haldol but she did not like it.  Patient mentioned that she's been feeling more depressed since her mother died 2 years ago due to tumors in her long and 3 years ago her sister died.  She has multiple losses.  She feels worthless and she admitted using crack cocaine in past 2 weeks.  She wants to get better but she is requesting help and treatment.  HPI Elements:   Location:  Medical floor. Quality:  Poor. Severity:  Unable to function. Duration:  For past few weeks.  Past Medical History:  Past Medical History  Diagnosis Date  . CAD (coronary artery disease), native coronary artery 05/23/2014  . Insulin dependent diabetes mellitus   . Noncompliance   . Obesity (BMI 30-39.9) 05/23/2014  . Hyperlipidemia 05/23/2014  . Hypertension   . Cocaine abuse 05/23/2014    Past Surgical History  Procedure Laterality Date  . Appendectomy    . Abdominal hysterectomy    . Cholecystectomy    . Left heart catheterization with coronary angiogram N/A 05/23/2014    Procedure: LEFT HEART CATHETERIZATION WITH CORONARY ANGIOGRAM;  Surgeon: Runell Gess, MD;  Location: [facility] CATH LAB;  Service: Cardiovascular;  Laterality: N/A;  . I&d  extremity Right 05/25/2014    Procedure: IRRIGATION AND DEBRIDEMENT EXTREMITY;  Surgeon: Dairl Ponder, MD;  Location: [facility] OR;  Service: Orthopedics;  Laterality: Right;   Family History: History reviewed. No pertinent family history. Social History:  History  Alcohol Use No     History  Drug Use  . Yes    Comment: Cocaine    History   Social History  . Marital Status: Divorced    Spouse Name: N/A  . Number of Children: N/A  . Years of Education: N/A    Social History Main Topics  . Smoking status: Current Every Day Smoker -- 1.50 packs/day for 30 years    Types: Cigarettes  . Smokeless tobacco: Never Used  . Alcohol Use: No  . Drug Use: Yes     Comment: Cocaine  . Sexual Activity: Not on file   Other Topics Concern  . None   Social History Narrative   Additional Social History:                          Allergies:   Allergies  Allergen Reactions  . Compazine [Prochlorperazine Edisylate] Other (See Comments)  . Avelox [Moxifloxacin Hcl In Nacl]   . Levaquin [Levofloxacin In D5w]     Vitals: Blood pressure 142/66, pulse 66, temperature 98.1 F (36.7 C), temperature source Oral, resp. rate 18, height  (1.626 m), weight 74.3 kg (163 lb 12.8 oz), SpO2 97 %.  Risk to Self: Is patient at risk for suicide?: Yes Risk to Others:   Prior Inpatient Therapy:   Prior Outpatient Therapy:    Current Facility-Administered Medications  Medication Dose Route Frequency Provider Last Rate Last Dose  . 0.9 %  sodium chloride infusion   Intravenous Continuous Lennette Bihari, MD 10 mL/hr at 05/26/14 2000    . acetaminophen (TYLENOL) tablet 650 mg  650 mg Oral Q4H PRN Othella Boyer, MD   650 mg at 05/28/14 1610  . ALPRAZolam Prudy Feeler) tablet 1 mg  1 mg Oral TID PRN Lennette Bihari, MD   1 mg at 05/29/14 1329  . amitriptyline (ELAVIL) tablet 100 mg  100 mg Oral QHS Dairl Ponder, MD   100 mg at 05/28/14 2240  . aspirin EC tablet 81 mg  81 mg Oral Daily Othella Boyer, MD   81 mg at 05/29/14 1027  . atorvastatin (LIPITOR) tablet 80 mg  80 mg Oral q1800 Othella Boyer, MD   80 mg at 05/28/14 1632  . diltiazem (CARDIZEM CD) 24 hr capsule 300 mg  300 mg Oral Daily Lennette Bihari, MD   300 mg at 05/29/14 1028  . doxycycline (VIBRA-TABS) tablet 100 mg  100 mg Oral Q12H Laurey Morale, MD   100 mg at 05/29/14 1259  . gabapentin (NEURONTIN) capsule 300 mg  300 mg Oral QHS Dairl Ponder, MD   300 mg at 05/28/14 2240  .  gabapentin (NEURONTIN) tablet 600 mg  600 mg Oral TID AC Lennette Bihari, MD   600 mg at 05/29/14 1300  . heparin injection 5,000 Units  5,000 Units Subcutaneous 3 times per day Lennette Bihari, MD   5,000 Units at 05/29/14 1256  . HYDROcodone-acetaminophen (NORCO/VICODIN) 5-325 MG per tablet 1 tablet  1 tablet Oral Q6H PRN Lennette Bihari, MD   1 tablet at 05/29/14 1300  . insulin aspart (novoLOG) injection 0-20 Units  0-20 Units Subcutaneous TID WC Othella Boyer, MD  3 Units at 05/29/14 0704  . insulin aspart (novoLOG) injection 0-5 Units  0-5 Units Subcutaneous QHS Lennette Biharihomas A Kelly, MD   2 Units at 05/27/14 2229  . insulin aspart (novoLOG) injection 5 Units  5 Units Subcutaneous TID WC Lennette Biharihomas A Kelly, MD   5 Units at 05/29/14 0813  . insulin glargine (LANTUS) injection 50 Units  50 Units Subcutaneous QHS Lennette Biharihomas A Kelly, MD   50 Units at 05/27/14 2229  . lisdexamfetamine (VYVANSE) capsule 20 mg  20 mg Oral q morning - 10a Dairl PonderMatthew Weingold, MD   20 mg at 05/29/14 1036  . lisinopril (PRINIVIL,ZESTRIL) tablet 40 mg  40 mg Oral Daily Laurey Moralealton S McLean, MD   40 mg at 05/29/14 1029  . metFORMIN (GLUCOPHAGE) tablet 500 mg  500 mg Oral BID WC Lennette Biharihomas A Kelly, MD   500 mg at 05/29/14 0813  . naproxen (NAPROSYN) tablet 500 mg  500 mg Oral BID WC Lennette Biharihomas A Kelly, MD   500 mg at 05/29/14 1026  . nicotine (NICODERM CQ - dosed in mg/24 hours) patch 21 mg  21 mg Transdermal Daily Glori Luisaniel Friedman, MD   21 mg at 05/29/14 1030  . nitroGLYCERIN (NITROSTAT) SL tablet 0.4 mg  0.4 mg Sublingual Q5 Min x 3 PRN Othella BoyerWilliam S Tilley, MD      . ondansetron [facility](ZOFRAN) injection 4 mg  4 mg Intravenous Q6H PRN Othella BoyerWilliam S Tilley, MD   4 mg at 05/24/14 [phone]  . pregabalin (LYRICA) capsule 100 mg  100 mg Oral Q8H PRN Dairl PonderMatthew Weingold, MD   100 mg at 05/28/14 2110  . senna-docusate (Senokot-S) tablet 1 tablet  1 tablet Oral BID Lennette Biharihomas A Kelly, MD   1 tablet at 05/29/14 1028  . sodium chloride 0.9 % injection 10-40 mL  10-40 mL Intracatheter  Q12H Corky CraftsJayadeep S Varanasi, MD   30 mL at 05/28/14 2200  . sodium chloride 0.9 % injection 10-40 mL  10-40 mL Intracatheter PRN Corky CraftsJayadeep S Varanasi, MD   30 mL at 05/29/14 0548    Musculoskeletal: Strength & Muscle Tone: within normal limits Gait & Station: normal Patient leans: N/A  Psychiatric Specialty Exam: Physical Exam  ROS  Blood pressure 142/66, pulse 66, temperature 98.1 F (36.7 C), temperature source Oral, resp. rate 18, height 5\' 4"  (1.626 m), weight 74.3 kg (163 lb 12.8 oz), SpO2 97 %.Body mass index is 28.1 kg/(m^2).  General Appearance: Casual and Guarded  Eye Contact::  Fair  Speech:  Slow  Volume:  Increased  Mood:  Depressed, Hopeless and Irritable  Affect:  Constricted and Depressed  Thought Process:  Logical  Orientation:  Full (Time, Place, and Person)  Thought Content:  Hallucinations: Auditory  Suicidal Thoughts:  Yes.  with intent/plan  Homicidal Thoughts:  No  Memory:  Immediate;   Fair Recent;   Fair Remote;   Fair  Judgement:  Impaired  Insight:  Lacking  Psychomotor Activity:  Increased  Concentration:  Fair  Recall:  FiservFair  Fund of Knowledge:Fair  Language: Fair  Akathisia:  No  Handed:  Right  AIMS (if indicated):     Assets:  Communication Skills  ADL's:  Intact  Cognition: WNL  Sleep:      Medical Decision Making: Review of Psycho-Social Stressors (1), Review or order clinical lab tests (1), Established Problem, Worsening (2), New Problem, with no additional work-up planned (3), Review of Medication Regimen & Side Effects (2) and Review of New Medication or Change in Dosage (2)  Treatment Plan  Summary: Daily contact with patient to assess and evaluate symptoms and progress in treatment and Plan Continue sitter for patient's safety.  Continue amitriptyline  Plan:  Recommend psychiatric Inpatient admission when medically cleared.   Disposition: Patient requires inpatient psychiatric treatment when she is medically cleared.  Patient will be  seen in consultation liaison services for follow-up  Kaydon Husby T. 05/29/2014 1:34 PM

## 2014-05-29 NOTE — Progress Notes (Signed)
Patient ID: Shirley Short, female   DOB: 26-Mar-1960, 54 y.o.   MRN: [id number]   Subjective:  Chronic dull chest ache.  Diffuse mild abdominal pain.  Hand does not hurt today.  She has threatened suicide and says she has a plan.  She now has a Actuary.   Objective:   Vital Signs : Filed Vitals:   05/28/14 1055 05/28/14 1342 05/28/14 2001 05/29/14 0546  BP: 175/52 156/57 143/59 142/66  Pulse: 92 81 61 66  Temp: 97.5 F (36.4 C) 97.5 F (36.4 C) 97.8 F (36.6 C) 98.1 F (36.7 C)  TempSrc: Oral Oral Oral Oral  Resp:  19 19 18   Height:      Weight:      SpO2: 99% 100% 96% 97%    Intake/Output from previous day:  Intake/Output Summary (Last 24 hours) at 05/29/14 1207 Last data filed at 05/29/14 0207  Gross per 24 hour  Intake    600 ml  Output    200 ml  Net    400 ml     I/O since admission: +573  Wt Readings from Last 3 Encounters:  05/23/14 163 lb 12.8 oz (74.3 kg)    Medications: . amitriptyline  100 mg Oral QHS  . aspirin EC  81 mg Oral Daily  . atorvastatin  80 mg Oral q1800  . diltiazem  300 mg Oral Daily  . doxycycline  100 mg Oral Q12H  . gabapentin  300 mg Oral QHS  . gabapentin  600 mg Oral TID AC  . heparin subcutaneous  5,000 Units Subcutaneous 3 times per day  . insulin aspart  0-20 Units Subcutaneous TID WC  . insulin aspart  0-5 Units Subcutaneous QHS  . insulin aspart  5 Units Subcutaneous TID WC  . insulin glargine  50 Units Subcutaneous QHS  . lisdexamfetamine  20 mg Oral q morning - 10a  . lisinopril  40 mg Oral Daily  . metFORMIN  500 mg Oral BID WC  . naproxen  500 mg Oral BID WC  . nicotine  21 mg Transdermal Daily  . senna-docusate  1 tablet Oral BID  . sodium chloride  10-40 mL Intracatheter Q12H    . sodium chloride 10 mL/hr at 05/26/14 2000    Physical Exam:   General appearance: alert, cooperative and no distress Neck: no adenopathy, no carotid bruit, no JVD, supple, symmetrical, trachea midline and thyroid not enlarged,  symmetric, no tenderness/mass/nodules Lungs: CTAB anteriorly Heart: RRR 71; 1/6 systolic murmur; no rubs, thrills, heaves Abdomen: mild central adiposity; BS +; nontender Extremities: R hand arm bandaged;  no LE edema Neurologic: Grossly normal  Psychological: anxious; concerned about pain meds  Rhythm:  NSR no ectopy  Lab Results:  BMP Latest Ref Rng 05/29/2014 05/28/2014 05/26/2014  Glucose 70 - 99 mg/dL 152(H) 220(H) 207(H)  BUN 6 - 23 mg/dL 8 10 8   Creatinine 0.50 - 1.10 mg/dL 0.59 0.76 0.70  Sodium 135 - 145 mmol/L 136 137 133(L)  Potassium 3.5 - 5.1 mmol/L 4.7 4.5 4.4  Chloride 96 - 112 mmol/L 101 97 98  CO2 19 - 32 mmol/L 31 32 27  Calcium 8.4 - 10.5 mg/dL 9.2 9.3 8.7   Hepatic Function Latest Ref Rng 05/23/2014 04/15/2009 04/14/2009  Total Protein 6.0 - 8.3 g/dL 7.0 7.1 7.2  Albumin 3.5 - 5.2 g/dL 3.4(L) 3.5 3.5  AST 0 - 37 U/L 15 23 27   ALT 0 - 35 U/L 9 18 20   Alk Phosphatase 39 -  117 U/L 92 98 94  Total Bilirubin 0.3 - 1.2 mg/dL 0.2(L) 0.5 0.6     CBC Latest Ref Rng 05/28/2014 05/26/2014 05/25/2014  WBC 4.0 - 10.5 K/uL 8.0 9.3 10.5  Hemoglobin 12.0 - 15.0 g/dL 11.7(L) 11.4(L) 12.3  Hematocrit 36.0 - 46.0 % 36.1 34.6(L) 36.5  Platelets 150 - 400 K/uL 159 107(L) 110(L)     No results for input(s): TROPONINI in the last 72 hours.  Invalid input(s): CK, MB  Hepatic Function Panel No results for input(s): PROT, ALBUMIN, AST, ALT, ALKPHOS, BILITOT, BILIDIR, IBILI in the last 72 hours. No results for input(s): INR in the last 72 hours. BNP (last 3 results) No results for input(s): BNP in the last 8760 hours.  ProBNP (last 3 results) No results for input(s): PROBNP in the last 8760 hours.   Lipid Panel     Component Value Date/Time   CHOL 215* 05/24/2014 0245   TRIG 145 05/24/2014 0245   HDL 46 05/24/2014 0245   CHOLHDL 4.7 05/24/2014 0245   VLDL 29 05/24/2014 0245   LDLCALC 140* 05/24/2014 0245    05/23/14 CATH ANGIOGRAPHIC RESULTS:   1. Left main; normal  2.  LAD; 70% smooth segmental distal. There was a high diagonal branch which was small in caliber that had 70-80% segmental mid stenosis 3. Left circumflex; codominant and free of significant disease.  4. Right coronary artery; codominant with 75% segmental PLA stenosis. The PDA arose high in the RCA 5. Left ventriculography; RAO left ventriculogram was performed using  25 mL of Visipaque dye at 12 mL/second. The overall LVEF estimated  60 % Without wall motion abnormalities  Assessment/Plan:  1. CAD:  Admitted with chest pain, 1 elevated troponin to 0.1. LHC showed 2 v CAD with slight progression from 2010. Unable to give IC NTG during study to assess for coronary spasm. HR better now on cardizem CD 300 mg CD.  If beta blocker is needed would only use Bystolic with nitric oxide mediated potential for vasodilation in light of cocaine history.  Statin started. 2. Hypertension: Now on cardizem/lisinopril. Will titrate lisinopril to 40 mg daily, BP still high. 3. Hyperlipidemia:  LDL 140; atorvastatin initiated. 4. R hand superficial abscess: Underwent I&D in OR 3/2 with general anesthesia; now on IV Ancef. Sensitivities back, will put her on doxycycline. Will need surgery to reassess prior to discharge.  5. DM: Not well controlled.  HbA1c 12.1.  Have adjusted meds.  6. Tobacco abuse: again discussed smoking cessation; currently on nicotine patch. 7. Cocaine use: Discussed avoidance of social contacts 8. Chronic pain: Frequently asking for pain medications.  9. Psych: Suicidal ideation with plan.  Has sitter, will call psychiatry today.    Loralie Champagne   05/29/2014, 12:07 PM

## 2014-05-30 DIAGNOSIS — J438 Other emphysema: Secondary | ICD-10-CM

## 2014-05-30 DIAGNOSIS — F333 Major depressive disorder, recurrent, severe with psychotic symptoms: Secondary | ICD-10-CM

## 2014-05-30 LAB — BASIC METABOLIC PANEL
Anion gap: 8 (ref 5–15)
BUN: 11 mg/dL (ref 6–23)
CALCIUM: 9.6 mg/dL (ref 8.4–10.5)
CO2: 31 mmol/L (ref 19–32)
CREATININE: 0.7 mg/dL (ref 0.50–1.10)
Chloride: 98 mmol/L (ref 96–112)
GFR calc non Af Amer: >90 mL/min (ref >90)
Glucose, Bld: 182 mg/dL — ABNORMAL HIGH (ref 70–99)
Potassium: 4.8 mmol/L (ref 3.5–5.1)
Sodium: 137 mmol/L (ref 135–145)

## 2014-05-30 LAB — GLUCOSE, CAPILLARY
GLUCOSE-CAPILLARY: 221 mg/dL — AB (ref 70–99)
Glucose-Capillary: 211 mg/dL — ABNORMAL HIGH (ref 70–99)
Glucose-Capillary: 166 mg/dL — ABNORMAL HIGH (ref 70–99)
Glucose-Capillary: 121 mg/dL — ABNORMAL HIGH (ref 70–99)

## 2014-05-30 LAB — ANAEROBIC CULTURE: Gram Stain: NONE SEEN

## 2014-05-30 LAB — CBC
HCT: 37.7 % (ref 36.0–46.0)
Hemoglobin: 12.2 g/dL (ref 12.0–15.0)
MCH: 26.9 pg (ref 26.0–34.0)
MCHC: 32.4 g/dL (ref 30.0–36.0)
MCV: 83.2 fL (ref 78.0–100.0)
PLATELETS: 210 10*3/uL (ref 150–400)
RBC: 4.53 MIL/uL (ref 3.87–5.11)
RDW: 13.6 % (ref 11.5–15.5)
WBC: 8.9 10*3/uL (ref 4.0–10.5)

## 2014-05-30 MED ORDER — HYDROCODONE-ACETAMINOPHEN 5-325 MG PO TABS
1.0000 | ORAL_TABLET | Freq: Once | ORAL | Status: AC
  Administered 2014-05-30: 1 via ORAL
  Filled 2014-05-30: qty 1

## 2014-05-30 MED ORDER — MORPHINE SULFATE 2 MG/ML IJ SOLN
2.0000 mg | Freq: Once | INTRAMUSCULAR | Status: AC
  Administered 2014-05-30: 2 mg via INTRAVENOUS
  Filled 2014-05-30: qty 1

## 2014-05-30 MED ORDER — NEBIVOLOL HCL 5 MG PO TABS
5.0000 mg | ORAL_TABLET | Freq: Every day | ORAL | Status: DC
  Administered 2014-05-30: 5 mg via ORAL
  Filled 2014-05-30 (×2): qty 1

## 2014-05-30 NOTE — Progress Notes (Signed)
PT Cancellation Note  Patient Details Name: Shirley SimmondsDonna L Canizales MRN: [id number] DOB: 05/15/1960   Cancelled Treatment:    Reason Eval/Treat Not Completed: Patient declined, no reason specified Pt irritated and upset about current pain in right hand. Pt refusing hydrotherapy until further pain medication is ordered. Lengthy discussion with pt about importance of hydrotherapy however pt continuing to refuse, "I won't let you touch me even when I get my next dose of meds, not until they give me more." RN notified.   Maybe pt can get additional dose of pain meds prior to hydrotherapy to improve tolerance. Will follow up on 3/8 and will try to coordinate pain meds with hydro.  Alvie HeidelbergFolan, Rowyn Spilde A 05/30/2014, 10:50 AM Alvie HeidelbergShauna Folan, PT, DPT [phone]

## 2014-05-30 NOTE — Progress Notes (Signed)
Patient Profile: This 54 year old female has a history of insulin-dependent diabetes, hypertension, hyperlipidemia, COPD and cocaine abuse (UDS+) admitted for chest pain.    Subjective: Complains of sever pain in right hand/arm. Also note recurrence of intermittent SSCP. Asking for pain meds (Dilaudid). States that she feels better having a Comptroller. Currently w/o thoughts of suicide.   Objective: Vital signs in last 24 hours: Temp:  [97.6 F (36.4 C)-99 F (37.2 C)] 97.6 F (36.4 C) (03/07 0513) Pulse Rate:  [64-80] 80 (03/07 0513) Resp:  [19-20] 20 (03/07 0513) BP: (130-167)/(54-70) 167/70 mmHg (03/07 0513) SpO2:  [99 %-100 %] 99 % (03/07 0513) Last BM Date: 05/23/14  Intake/Output from previous day: 03/06 0701 - 03/07 0700 In: 240 [P.O.:240] Out: -  Intake/Output this shift:    Medications Current Facility-Administered Medications  Medication Dose Route Frequency Provider Last Rate Last Dose  . 0.9 %  sodium chloride infusion   Intravenous Continuous Lennette Bihari, MD 10 mL/hr at 05/26/14 2000    . acetaminophen (TYLENOL) tablet 650 mg  650 mg Oral Q4H PRN Othella Boyer, MD   650 mg at 05/29/14 1515  . ALPRAZolam Prudy Feeler) tablet 1 mg  1 mg Oral TID PRN Lennette Bihari, MD   1 mg at 05/30/14 0651  . amitriptyline (ELAVIL) tablet 100 mg  100 mg Oral QHS Dairl Ponder, MD   100 mg at 05/29/14 2052  . aspirin EC tablet 81 mg  81 mg Oral Daily Othella Boyer, MD   81 mg at 05/29/14 1027  . atorvastatin (LIPITOR) tablet 80 mg  80 mg Oral q1800 Othella Boyer, MD   80 mg at 05/29/14 1739  . diltiazem (CARDIZEM CD) 24 hr capsule 300 mg  300 mg Oral Daily Lennette Bihari, MD   300 mg at 05/29/14 1028  . doxycycline (VIBRA-TABS) tablet 100 mg  100 mg Oral Q12H Laurey Morale, MD   100 mg at 05/29/14 2052  . gabapentin (NEURONTIN) capsule 300 mg  300 mg Oral QHS Dairl Ponder, MD   300 mg at 05/29/14 2052  . gabapentin (NEURONTIN) tablet 600 mg  600 mg Oral TID AC Lennette Bihari, MD   600 mg at 05/29/14 1739  . heparin injection 5,000 Units  5,000 Units Subcutaneous 3 times per day Lennette Bihari, MD   5,000 Units at 05/30/14 [phone]  . HYDROcodone-acetaminophen (NORCO/VICODIN) 5-325 MG per tablet 1 tablet  1 tablet Oral Q6H PRN Lennette Bihari, MD   1 tablet at 05/30/14 [phone]  . insulin aspart (novoLOG) injection 0-20 Units  0-20 Units Subcutaneous TID WC Othella Boyer, MD   4 Units at 05/29/14 1740  . insulin aspart (novoLOG) injection 0-5 Units  0-5 Units Subcutaneous QHS Lennette Bihari, MD   2 Units at 05/27/14 2229  . insulin aspart (novoLOG) injection 5 Units  5 Units Subcutaneous TID WC Lennette Bihari, MD   5 Units at 05/30/14 [phone]  . insulin glargine (LANTUS) injection 50 Units  50 Units Subcutaneous QHS Lennette Bihari, MD   50 Units at 05/27/14 2229  . lisdexamfetamine (VYVANSE) capsule 20 mg  20 mg Oral q morning - 10a Dairl Ponder, MD   20 mg at 05/29/14 1036  . lisinopril (PRINIVIL,ZESTRIL) tablet 40 mg  40 mg Oral Daily Laurey Morale, MD   40 mg at 05/29/14 1029  . metFORMIN (GLUCOPHAGE) tablet 500 mg  500 mg Oral BID WC Lennette Bihari, MD  500 mg at 05/29/14 1739  . naproxen (NAPROSYN) tablet 500 mg  500 mg Oral BID WC Lennette Bihari, MD   500 mg at 05/29/14 1739  . nicotine (NICODERM CQ - dosed in mg/24 hours) patch 21 mg  21 mg Transdermal Daily Glori Luis, MD   21 mg at 05/29/14 1030  . nitroGLYCERIN (NITROSTAT) SL tablet 0.4 mg  0.4 mg Sublingual Q5 Min x 3 PRN Othella Boyer, MD      . ondansetron [facility]) injection 4 mg  4 mg Intravenous Q6H PRN Othella Boyer, MD   4 mg at 05/24/14 [phone]  . pregabalin (LYRICA) capsule 100 mg  100 mg Oral Q8H PRN Dairl Ponder, MD   100 mg at 05/29/14 1802  . senna-docusate (Senokot-S) tablet 1 tablet  1 tablet Oral BID Lennette Bihari, MD   1 tablet at 05/29/14 2054  . sodium chloride 0.9 % injection 10-40 mL  10-40 mL Intracatheter Q12H Corky Crafts, MD   30 mL at 05/28/14 2200  . sodium  chloride 0.9 % injection 10-40 mL  10-40 mL Intracatheter PRN Corky Crafts, MD   30 mL at 05/30/14 0301    PE: General appearance: alert, cooperative, no distress and dentition in poor repair Neck: no carotid bruit and no JVD Lungs: clear to auscultation bilaterally Heart: regular rate and rhythm, S1, S2 normal, no murmur, click, rub or gallop Extremities: no LEE; right UE dressed with mild hand edema  Pulses: 2+ and symmetric Skin: warm and dry Neurologic: Grossly normal  Lab Results:   Recent Labs  05/28/14 0343 05/30/14 0305  WBC 8.0 8.9  HGB 11.7* 12.2  HCT 36.1 37.7  PLT 159 210   BMET  Recent Labs  05/28/14 0343 05/29/14 0548 05/30/14 0305  NA 137 136 137  K 4.5 4.7 4.8  CL 97 101 98  CO2 32 31 31  GLUCOSE 220* 152* 182*  BUN CREATININE 0.76 0.59 0.70  CALCIUM 9.3 9.2 9.6   PT/INR No results for input(s): LABPROT, INR in the last 72 hours. Cholesterol No results for input(s): CHOL in the last 72 hours. Cardiac Enzymes Invalid input(s): TROPONIN,  CKMB  Studies/Results: LHC 05/23/14 1. Left main; normal  2. LAD; 70% smooth segmental distal. There was a high diagonal branch which was small in caliber that had 70-80% segmental mid stenosis 3. Left circumflex; codominant and free of significant disease.  4. Right coronary artery; codominant with 75% segmental PLA stenosis. The PDA arose high in the RCA 5. Left ventriculography; RAO left ventriculogram was performed using  25 mL of Visipaque dye at 12 mL/second. The overall LVEF estimated  60 % Without wall motion abnormalities  Assessment/Plan  Principal Problem:   MDD (major depressive disorder), recurrent, severe, with psychosis Active Problems:   Insulin dependent diabetes mellitus   Cocaine abuse   Noncompliance   CAD (coronary artery disease), native coronary artery   Obesity (BMI 30-39.9)   COPD (chronic obstructive pulmonary disease)   Acute coronary syndrome  1. CAD:  Admitted with chest pain, 1 elevated troponin to 0.1. LHC showed 2 v CAD with slight progression from 2010 (see cath findings above). Unable to give IC NTG during study to assess for coronary spasm. HR better now on cardizem CD 300 mg CD. Still with recurrent CP. If beta blocker is needed, would only use Bystolic with nitric oxide mediated potential for vasodilation in light of cocaine history. BP is moderately elevated.  Defer BB initiation to MD. Statin started.  2. Hypertension: on cardizem/lisinopril. Lisinopril increased to 40 mg daily yesterday. BP still high. ? Adding Bystolic.   3. Hyperlipidemia: LDL 140; atorvastatin initiated. Recheck FLP and LFTs in 6 weeks.  4. R hand superficial abscess: Underwent I&D in OR 3/2 with general anesthesia; now on doxycycline. Will need surgery to reassess prior to discharge.   5. DM: Not well controlled. HbA1c 12.1. Meds were adjusted over the weekend. Glucose levels are slightly improved. Will need close f/u with PCP.    6. Tobacco abuse:  smoking cessation strongly advised; currently on nicotine patch.  7. Cocaine use: UDS time of admit was +.  Discussed avoidance of social contacts  8. Chronic pain: Frequently asking for pain medications, particularly dilaudid for her hand pain. Will defer treatement to surgery.  9. Psych: Suicidal ideation with plan. Patient now feeling a bit better about herself. Has sitter. Psych  Consult yesterday: Plan is for inpatient psychiatric treatment when medically cleared.     LOS: 7 days    Brittainy M. Delmer IslamSimmons, PA-C 05/30/2014 7:52 AM  Personally seen and examined. Agree with above. Sleeping comfortably with sitter in room Agree with adding Bystolic 5mg  PO QD (case mgt cost?) see above.   Trop minimally elevated. CAD as noted above. OK for inpatient psychiatric treatment when bed available.   If DC, surgery to assess hand.   Donato SchultzSKAINS, MARK, MD

## 2014-05-30 NOTE — Progress Notes (Signed)
     Patient was complaining of chest pain and so an ECG was done. This revealed a new bradycardia and possible junctional escape? BP also soft. Will hold diltiazem 300mg  and bystolic 5mg  and continue to monitor.     Cline CrockKathryn Rosary Filosa PA-C  [facility]

## 2014-05-30 NOTE — Progress Notes (Signed)
Report given to oncoming RN. Afleming, RN 

## 2014-05-30 NOTE — Progress Notes (Signed)
Inpatient Diabetes Program Recommendations  AACE/ADA: New Consensus Statement on Inpatient Glycemic Control (2013)  Target Ranges:  Prepandial:   less than 140 mg/dL      Peak postprandial:   less than 180 mg/dL (1-2 hours)      Critically ill patients:  140 - 180 mg/dL   Results for Shirley Short, Shirley Short (MRN [id number]) as of 05/30/2014 10:10  Ref. Range 05/29/2014 06:59 05/29/2014 11:26 05/29/2014 16:15 05/29/2014 20:51 05/30/2014 06:43  Glucose-Capillary Latest Range: 70-99 mg/dL 829135 (H) 81 562151 (H) 85 130221 (H)   Diabetes history: DM2 Outpatient Diabetes medications: Lantus 40 units QHS, Humulin R 0-20 units TID with meals Current orders for Inpatient glycemic control: Lantus 50 units QHs, Novolog 0-20 units TID with meals, Novolog 0-5 units HS, Novolog 5 units TID with meals, Metformin 500 mg BID  Inpatient Diabetes Program Recommendations Insulin - Basal: In reviewing the chart, noted patient was NOT GIVEN Lantus last night due to CBG of 85 mg/dl at bedtime. Fasting glucose is 221 mg/dl this morning. May want to consider changing Lantus to Q24 hours starting now since patient did not receive any basal insulin on 05/29/14.   Thanks, Orlando PennerMarie Jolynn Bajorek, RN, MSN, CCRN, CDE Diabetes Coordinator Inpatient Diabetes Program [phone] (Team Pager) [phone] ([facility] office) [phone] [facility] office)

## 2014-05-30 NOTE — Progress Notes (Signed)
Pt c/o CP. EKG done showing bradycardia and possible junctional rhythms. PA on call made aware, at this time no new orders given. Will follow orders and continue to monitor.   Shirley Short

## 2014-05-30 NOTE — Progress Notes (Signed)
Patient has slept most of night, since having bath and receiving H.S. medications. Patient has continued to have bouts of urinary incontinence, either while sleeping or lack of control once upright. H.S. insulin held for CBG of 85. Patient provided icecream and juice at that time.  Sitter at bedside.

## 2014-05-30 NOTE — Progress Notes (Addendum)
Spoke with Joaquin CourtsLynn Inman RN physician extender regarding patient's pain. One time order for Hydrocodone was given so patient could tolerate hydrotherapy. Afleming, RN

## 2014-05-30 NOTE — Consult Note (Signed)
Psychiatry Consult follow-up note  Reason for Consult:  Depression and Suicidal thoughts Referring Physician:  Dr. Tresa Endo Patient Identification: Shirley Short MRN:  [id number] Principal Diagnosis: MDD (major depressive disorder), recurrent, severe, with psychosis Diagnosis:   Patient Active Problem List   Diagnosis Date Noted  . MDD (major depressive disorder), recurrent, severe, with psychosis [F33.3] 05/29/2014  . Cocaine abuse [F14.10] 05/23/2014  . CAD (coronary artery disease), native coronary artery [I25.10] 05/23/2014  . Obesity (BMI 30-39.9) [E66.9] 05/23/2014  . COPD (chronic obstructive pulmonary disease) [J44.9] 05/23/2014  . Hyperlipidemia [E78.5] 05/23/2014  . Acute coronary syndrome [I24.9] 05/23/2014  . Insulin dependent diabetes mellitus [E11.9, Z79.4]   . Noncompliance [Z91.19]   . Tobacco abuse [Z72.0]   . Hypertension [I10]     Total Time spent with patient: 30 minutes  Subjective:   Shirley Short is a 54 y.o. female patient admitted with multiple health issues.Marland Kitchen  HPI:  Patient seen chart reviewed patient is 54 year old Caucasian single female who was admitted because of multiple health issues and on the floor she mentioned about suicidal thoughts.  Patient is very labile and upset.  She mentioned that she is feeling hopeless helpless and she has planned to kill herself.  She is saving her Xanax and amitriptyline which are given by her primary care physician.  Patient mentioned that she's feeling depressed and having issues with sleep and controlling her anger.  She also endorses having auditory hallucination for past 4 weeks she did not describe the details.  Patient has history of at least 10 times inpatient treatment.  She has taken overdose on Seroquel in the past.  She also tried to kill herself by using razor blades in the past.  Patient lives by herself but she has a lot of family issues.  She wants to stay with her father but her father does not let her stay.   She has a daughter who live in [place] and she has to son but patient has no contact with them.  She is seeing Starling Manns, psych NP at [facility] 2 months ago but she admitted that she has not disclosed her suicidal plan to the psychiatrist.  She admitted with lately more irritable, angry, poor sleep, anhedonia and having severe mood swings and anger issues.  She could not contract for safety.  In the past she has taken Depakote and Haldol but she did not like it.  Patient mentioned that she's been feeling more depressed since her mother died 2 years ago due to tumors in her long and 3 years ago her sister died.  She has multiple losses.  She feels worthless and she admitted using crack cocaine in past 2 weeks.  She wants to get better but she is requesting help and treatment.  Interval history: Patient seen for psychiatric consultation follow-up today. Patient presented with emotionally unstable and suicidal thoughts along with feeling hopeless, helpless. Patient has thoughts about killing herself with overdose on her medications. Patient was seeing outpatient psychiatric services at [facility]. Patient reported she has been taking her medication Depakote and Haldol. Patient also talks about multiple losses in her family. She has a history of substance abuse too. Patient is willing to be placed in acute psychiatric hospitalization and medically stable as a voluntary basis.  HPI Elements:   Location:  Medical floor. Quality:  Poor. Severity:  Unable to function. Duration:  For past few weeks.  Past Medical History:  Past Medical History  Diagnosis  Date  . CAD (coronary artery disease), native coronary artery 05/23/2014  . Insulin dependent diabetes mellitus   . Noncompliance   . Obesity (BMI 30-39.9) 05/23/2014  . Hyperlipidemia 05/23/2014  . Hypertension   . Cocaine abuse 05/23/2014    Past Surgical History  Procedure Laterality Date  . Appendectomy    . Abdominal  hysterectomy    . Cholecystectomy    . Left heart catheterization with coronary angiogram N/A 05/23/2014    Procedure: LEFT HEART CATHETERIZATION WITH CORONARY ANGIOGRAM;  Surgeon: Runell Gess, MD;  Location: [facility] CATH LAB;  Service: Cardiovascular;  Laterality: N/A;  . I&d extremity Right 05/25/2014    Procedure: IRRIGATION AND DEBRIDEMENT EXTREMITY;  Surgeon: Dairl Ponder, MD;  Location: [facility];  Service: Orthopedics;  Laterality: Right;   Family History: History reviewed. No pertinent family history. Social History:  History  Alcohol Use No     History  Drug Use  . Yes    Comment: Cocaine    History   Social History  . Marital Status: Divorced    Spouse Name: N/A  . Number of Children: N/A  . Years of Education: N/A   Social History Main Topics  . Smoking status: Current Every Day Smoker -- 1.50 packs/day for 30 years    Types: Cigarettes  . Smokeless tobacco: Never Used  . Alcohol Use: No  . Drug Use: Yes     Comment: Cocaine  . Sexual Activity: Not on file   Other Topics Concern  . None   Social History Narrative   Additional Social History:                          Allergies:   Allergies  Allergen Reactions  . Compazine [Prochlorperazine Edisylate] Other (See Comments)  . Avelox [Moxifloxacin Hcl In Nacl]   . Levaquin [Levofloxacin In D5w]     Vitals: Blood pressure 167/70, pulse 80, temperature 97.6 F (36.4 C), temperature source Oral, resp. rate 20, height 5\' 4"  (1.626 m), weight 74.3 kg (163 lb 12.8 oz), SpO2 99 %.  Risk to Self: Is patient at risk for suicide?: Yes Risk to Others:   Prior Inpatient Therapy:   Prior Outpatient Therapy:    Current Facility-Administered Medications  Medication Dose Route Frequency Provider Last Rate Last Dose  . 0.9 %  sodium chloride infusion   Intravenous Continuous Lennette Bihari, MD 10 mL/hr at 05/26/14 2000    . acetaminophen (TYLENOL) tablet 650 mg  650 mg Oral Q4H PRN Othella Boyer, MD   650  mg at 05/29/14 1515  . ALPRAZolam Prudy Feeler) tablet 1 mg  1 mg Oral TID PRN Lennette Bihari, MD   1 mg at 05/30/14 0651  . amitriptyline (ELAVIL) tablet 100 mg  100 mg Oral QHS Dairl Ponder, MD   100 mg at 05/29/14 2052  . aspirin EC tablet 81 mg  81 mg Oral Daily Othella Boyer, MD   81 mg at 05/29/14 1027  . atorvastatin (LIPITOR) tablet 80 mg  80 mg Oral q1800 Othella Boyer, MD   80 mg at 05/29/14 1739  . diltiazem (CARDIZEM CD) 24 hr capsule 300 mg  300 mg Oral Daily Lennette Bihari, MD   300 mg at 05/29/14 1028  . doxycycline (VIBRA-TABS) tablet 100 mg  100 mg Oral Q12H Laurey Morale, MD   100 mg at 05/29/14 2052  . gabapentin (NEURONTIN) capsule 300 mg  300 mg  Oral QHS Dairl Ponder, MD   300 mg at 05/29/14 2052  . gabapentin (NEURONTIN) tablet 600 mg  600 mg Oral TID AC Lennette Bihari, MD   600 mg at 05/30/14 0834  . heparin injection 5,000 Units  5,000 Units Subcutaneous 3 times per day Lennette Bihari, MD   5,000 Units at 05/30/14 [phone]  . HYDROcodone-acetaminophen (NORCO/VICODIN) 5-325 MG per tablet 1 tablet  1 tablet Oral Q6H PRN Lennette Bihari, MD   1 tablet at 05/30/14 [phone]  . insulin aspart (novoLOG) injection 0-20 Units  0-20 Units Subcutaneous TID WC Othella Boyer, MD   4 Units at 05/29/14 1740  . insulin aspart (novoLOG) injection 0-5 Units  0-5 Units Subcutaneous QHS Lennette Bihari, MD   2 Units at 05/27/14 2229  . insulin aspart (novoLOG) injection 5 Units  5 Units Subcutaneous TID WC Lennette Bihari, MD   5 Units at 05/30/14 [phone]  . insulin glargine (LANTUS) injection 50 Units  50 Units Subcutaneous QHS Lennette Bihari, MD   50 Units at 05/27/14 2229  . lisdexamfetamine (VYVANSE) capsule 20 mg  20 mg Oral q morning - 10a Dairl Ponder, MD   20 mg at 05/29/14 1036  . lisinopril (PRINIVIL,ZESTRIL) tablet 40 mg  40 mg Oral Daily Laurey Morale, MD   40 mg at 05/29/14 1029  . metFORMIN (GLUCOPHAGE) tablet 500 mg  500 mg Oral BID WC Lennette Bihari, MD   500 mg at 05/30/14  0834  . naproxen (NAPROSYN) tablet 500 mg  500 mg Oral BID WC Lennette Bihari, MD   500 mg at 05/30/14 0834  . nebivolol (BYSTOLIC) tablet 5 mg  5 mg Oral Daily Donato Schultz, MD      . nicotine (NICODERM CQ - dosed in mg/24 hours) patch 21 mg  21 mg Transdermal Daily Glori Luis, MD   21 mg at 05/29/14 1030  . nitroGLYCERIN (NITROSTAT) SL tablet 0.4 mg  0.4 mg Sublingual Q5 Min x 3 PRN Othella Boyer, MD      . ondansetron [facility]) injection 4 mg  4 mg Intravenous Q6H PRN Othella Boyer, MD   4 mg at 05/24/14 [phone]  . pregabalin (LYRICA) capsule 100 mg  100 mg Oral Q8H PRN Dairl Ponder, MD   100 mg at 05/29/14 1802  . senna-docusate (Senokot-S) tablet 1 tablet  1 tablet Oral BID Lennette Bihari, MD   1 tablet at 05/29/14 2054  . sodium chloride 0.9 % injection 10-40 mL  10-40 mL Intracatheter Q12H Corky Crafts, MD   30 mL at 05/28/14 2200  . sodium chloride 0.9 % injection 10-40 mL  10-40 mL Intracatheter PRN Corky Crafts, MD   30 mL at 05/30/14 0301    Musculoskeletal: Strength & Muscle Tone: within normal limits Gait & Station: normal Patient leans: N/A  Psychiatric Specialty Exam: Physical Exam  ROS  Blood pressure 167/70, pulse 80, temperature 97.6 F (36.4 C), temperature source Oral, resp. rate 20, height  (1.626 m), weight 74.3 kg (163 lb 12.8 oz), SpO2 99 %.Body mass index is 28.1 kg/(m^2).  General Appearance: Casual and Guarded  Eye Contact::  Fair  Speech:  Slow  Volume:  Increased  Mood:  Depressed, Hopeless and Irritable  Affect:  Constricted and Depressed  Thought Process:  Logical  Orientation:  Full (Time, Place, and Person)  Thought Content:  Hallucinations: Auditory  Suicidal Thoughts:  Yes.  with intent/plan  Homicidal Thoughts:  No  Memory:  Immediate;   Fair Recent;   Fair Remote;   Fair  Judgement:  Impaired  Insight:  Lacking  Psychomotor Activity:  Increased  Concentration:  Fair  Recall:  [employer]Fair  Fund of Knowledge:Fair   Language: Fair  Akathisia:  No  Handed:  Right  AIMS (if indicated):     Assets:  Communication Skills  ADL's:  Intact  Cognition: WNL  Sleep:      Medical Decision Making: Review of Psycho-Social Stressors (1), Review or order clinical lab tests (1), Established Problem, Worsening (2), New Problem, with no additional work-up planned (3), Review of Medication Regimen & Side Effects (2) and Review of New Medication or Change in Dosage (2)  Treatment Plan Summary: Daily contact with patient to assess and evaluate symptoms and progress in treatment and Plan Continue sitter for patient's safety.  Continue amitriptyline  Plan:  Recommend psychiatric Inpatient admission when medically cleared.   Disposition:  Patient requires inpatient psychiatric treatment when she is medically cleared.   Patient will be seen in consultation liaison services for follow-up   Lamya Lausch,JANARDHAHA R. 05/30/2014 9:09 AM

## 2014-05-30 NOTE — Progress Notes (Signed)
Utilization review complete. Saphia Vanderford RN CCM Case Mgmt phone [phone] 

## 2014-05-31 ENCOUNTER — Telehealth: Payer: Self-pay | Admitting: Physician Assistant

## 2014-05-31 ENCOUNTER — Encounter (HOSPITAL_COMMUNITY): Payer: Self-pay | Admitting: Physician Assistant

## 2014-05-31 ENCOUNTER — Inpatient Hospital Stay (HOSPITAL_COMMUNITY)
Admission: AD | Admit: 2014-05-31 | Discharge: 2014-06-01 | DRG: 885 | Disposition: A | Discharge status: Home / Self Care | Payer: Federal, State, Local not specified - Other | Source: Intra-hospital | Attending: Psychiatry | Admitting: Psychiatry

## 2014-05-31 ENCOUNTER — Encounter (HOSPITAL_COMMUNITY): Payer: Self-pay | Admitting: Behavioral Health

## 2014-05-31 DIAGNOSIS — R45851 Suicidal ideations: Secondary | ICD-10-CM

## 2014-05-31 DIAGNOSIS — F329 Major depressive disorder, single episode, unspecified: Secondary | ICD-10-CM | POA: Diagnosis present

## 2014-05-31 DIAGNOSIS — Z825 Family history of asthma and other chronic lower respiratory diseases: Secondary | ICD-10-CM

## 2014-05-31 DIAGNOSIS — F419 Anxiety disorder, unspecified: Secondary | ICD-10-CM | POA: Diagnosis present

## 2014-05-31 DIAGNOSIS — L03113 Cellulitis of right upper limb: Secondary | ICD-10-CM | POA: Diagnosis not present

## 2014-05-31 DIAGNOSIS — E785 Hyperlipidemia, unspecified: Secondary | ICD-10-CM | POA: Diagnosis present

## 2014-05-31 DIAGNOSIS — E119 Type 2 diabetes mellitus without complications: Secondary | ICD-10-CM | POA: Diagnosis present

## 2014-05-31 DIAGNOSIS — Z590 Homelessness: Secondary | ICD-10-CM

## 2014-05-31 DIAGNOSIS — Z7982 Long term (current) use of aspirin: Secondary | ICD-10-CM | POA: Diagnosis not present

## 2014-05-31 DIAGNOSIS — J449 Chronic obstructive pulmonary disease, unspecified: Secondary | ICD-10-CM | POA: Diagnosis present

## 2014-05-31 DIAGNOSIS — Z794 Long term (current) use of insulin: Secondary | ICD-10-CM | POA: Diagnosis not present

## 2014-05-31 DIAGNOSIS — F17210 Nicotine dependence, cigarettes, uncomplicated: Secondary | ICD-10-CM | POA: Diagnosis present

## 2014-05-31 DIAGNOSIS — I2510 Atherosclerotic heart disease of native coronary artery without angina pectoris: Secondary | ICD-10-CM | POA: Diagnosis present

## 2014-05-31 DIAGNOSIS — I10 Essential (primary) hypertension: Secondary | ICD-10-CM | POA: Diagnosis present

## 2014-05-31 DIAGNOSIS — F332 Major depressive disorder, recurrent severe without psychotic features: Principal | ICD-10-CM | POA: Diagnosis present

## 2014-05-31 DIAGNOSIS — G894 Chronic pain syndrome: Secondary | ICD-10-CM

## 2014-05-31 DIAGNOSIS — L02511 Cutaneous abscess of right hand: Secondary | ICD-10-CM | POA: Diagnosis present

## 2014-05-31 DIAGNOSIS — Z9119 Patient's noncompliance with other medical treatment and regimen: Secondary | ICD-10-CM | POA: Diagnosis not present

## 2014-05-31 DIAGNOSIS — F1410 Cocaine abuse, uncomplicated: Secondary | ICD-10-CM | POA: Diagnosis not present

## 2014-05-31 DIAGNOSIS — F333 Major depressive disorder, recurrent, severe with psychotic symptoms: Secondary | ICD-10-CM | POA: Diagnosis not present

## 2014-05-31 LAB — GLUCOSE, CAPILLARY
GLUCOSE-CAPILLARY: 131 mg/dL — AB (ref 70–99)
Glucose-Capillary: 160 mg/dL — ABNORMAL HIGH (ref 70–99)
Glucose-Capillary: 135 mg/dL — ABNORMAL HIGH (ref 70–99)
Glucose-Capillary: 121 mg/dL — ABNORMAL HIGH (ref 70–99)

## 2014-05-31 MED ORDER — NICOTINE 21 MG/24HR TD PT24
21.0000 mg | MEDICATED_PATCH | Freq: Every day | TRANSDERMAL | Status: DC
  Filled 2014-05-31 (×4): qty 1

## 2014-05-31 MED ORDER — MORPHINE SULFATE 4 MG/ML IJ SOLN
4.0000 mg | Freq: Every day | INTRAMUSCULAR | Status: DC
  Administered 2014-05-31: 4 mg via INTRAVENOUS
  Filled 2014-05-31: qty 1

## 2014-05-31 MED ORDER — METFORMIN HCL 500 MG PO TABS
500.0000 mg | ORAL_TABLET | Freq: Two times a day (BID) | ORAL | Status: DC
  Administered 2014-05-31 – 2014-06-01 (×2): 500 mg via ORAL
  Filled 2014-05-31 (×8): qty 1

## 2014-05-31 MED ORDER — INSULIN ASPART 100 UNIT/ML ~~LOC~~ SOLN
0.0000 [IU] | Freq: Three times a day (TID) | SUBCUTANEOUS | Status: DC
  Administered 2014-06-01: 17:00:00 via SUBCUTANEOUS

## 2014-05-31 MED ORDER — ISOSORBIDE MONONITRATE ER 30 MG PO TB24
30.0000 mg | ORAL_TABLET | Freq: Every day | ORAL | Status: DC
  Filled 2014-05-31 (×3): qty 1

## 2014-05-31 MED ORDER — IBUPROFEN 600 MG PO TABS
600.0000 mg | ORAL_TABLET | Freq: Four times a day (QID) | ORAL | Status: DC | PRN
  Administered 2014-05-31: 600 mg via ORAL
  Filled 2014-05-31: qty 1

## 2014-05-31 MED ORDER — ISOSORBIDE MONONITRATE ER 30 MG PO TB24
30.0000 mg | ORAL_TABLET | Freq: Every day | ORAL | Status: DC
  Filled 2014-05-31 (×2): qty 1

## 2014-05-31 MED ORDER — ASPIRIN EC 81 MG PO TBEC
81.0000 mg | DELAYED_RELEASE_TABLET | Freq: Every day | ORAL | Status: DC
  Filled 2014-05-31 (×2): qty 1

## 2014-05-31 MED ORDER — LISINOPRIL 40 MG PO TABS
40.0000 mg | ORAL_TABLET | Freq: Every day | ORAL | Status: DC
  Filled 2014-05-31 (×2): qty 1

## 2014-05-31 MED ORDER — AMLODIPINE BESYLATE 5 MG PO TABS
5.0000 mg | ORAL_TABLET | Freq: Every day | ORAL | Status: DC
  Filled 2014-05-31 (×4): qty 1

## 2014-05-31 MED ORDER — AMLODIPINE BESYLATE 5 MG PO TABS: 5.0000 mg | ORAL_TABLET | Freq: Every day | ORAL | Status: DC

## 2014-05-31 MED ORDER — NICOTINE 21 MG/24HR TD PT24
21.0000 mg | MEDICATED_PATCH | Freq: Every day | TRANSDERMAL | Status: DC
  Filled 2014-05-31 (×2): qty 1

## 2014-05-31 MED ORDER — ASPIRIN 81 MG PO TBEC
81.0000 mg | DELAYED_RELEASE_TABLET | Freq: Every day | ORAL | Status: DC
Start: 2014-05-31 — End: 2014-07-15

## 2014-05-31 MED ORDER — KETOROLAC TROMETHAMINE 30 MG/ML IJ SOLN
15.0000 mg | Freq: Once | INTRAMUSCULAR | Status: AC
  Administered 2014-05-31: 15 mg via INTRAMUSCULAR
  Filled 2014-05-31 (×2): qty 1

## 2014-05-31 MED ORDER — MAGNESIUM HYDROXIDE 400 MG/5ML PO SUSP: 30.0000 mL | Freq: Every day | ORAL | Status: DC | PRN

## 2014-05-31 MED ORDER — DOXYCYCLINE HYCLATE 100 MG PO TABS
100.0000 mg | ORAL_TABLET | Freq: Two times a day (BID) | ORAL | Status: DC
Start: 2014-05-31 — End: 2014-06-01
  Administered 2014-05-31: 100 mg via ORAL
  Filled 2014-05-31 (×7): qty 1

## 2014-05-31 MED ORDER — PANTOPRAZOLE SODIUM 40 MG PO TBEC
40.0000 mg | DELAYED_RELEASE_TABLET | Freq: Every day | ORAL | Status: DC
  Administered 2014-05-31: 40 mg via ORAL
  Filled 2014-05-31 (×6): qty 1

## 2014-05-31 MED ORDER — AMLODIPINE BESYLATE 5 MG PO TABS
5.0000 mg | ORAL_TABLET | Freq: Every day | ORAL | Status: DC
  Administered 2014-05-31: 5 mg via ORAL
  Filled 2014-05-31: qty 1

## 2014-05-31 MED ORDER — DOXYCYCLINE HYCLATE 100 MG PO TABS
100.0000 mg | ORAL_TABLET | Freq: Two times a day (BID) | ORAL | Status: DC
Start: 2014-05-31 — End: 2014-06-04

## 2014-05-31 MED ORDER — LISINOPRIL 40 MG PO TABS: 40.0000 mg | ORAL_TABLET | Freq: Every day | ORAL | Status: DC

## 2014-05-31 MED ORDER — ISOSORBIDE MONONITRATE ER 30 MG PO TB24: 30.0000 mg | ORAL_TABLET | Freq: Every day | ORAL | Status: DC

## 2014-05-31 MED ORDER — LISINOPRIL 20 MG PO TABS
40.0000 mg | ORAL_TABLET | Freq: Every day | ORAL | Status: DC
  Filled 2014-05-31 (×4): qty 2

## 2014-05-31 MED ORDER — ASPIRIN EC 81 MG PO TBEC
81.0000 mg | DELAYED_RELEASE_TABLET | Freq: Every day | ORAL | Status: DC
  Filled 2014-05-31 (×4): qty 1

## 2014-05-31 MED ORDER — ALUM & MAG HYDROXIDE-SIMETH 200-200-20 MG/5ML PO SUSP: 30.0000 mL | ORAL | Status: DC | PRN

## 2014-05-31 MED ORDER — GABAPENTIN 300 MG PO CAPS: 600.0000 mg | ORAL_CAPSULE | Freq: Three times a day (TID) | ORAL | Status: DC

## 2014-05-31 MED ORDER — INSULIN ASPART 100 UNIT/ML ~~LOC~~ SOLN
4.0000 [IU] | Freq: Three times a day (TID) | SUBCUTANEOUS | Status: DC
  Administered 2014-06-01: 4 [IU] via SUBCUTANEOUS

## 2014-05-31 MED ORDER — ATORVASTATIN CALCIUM 80 MG PO TABS
80.0000 mg | ORAL_TABLET | Freq: Every day | ORAL | Status: DC
  Administered 2014-05-31: 80 mg via ORAL
  Filled 2014-05-31: qty 1
  Filled 2014-05-31: qty 2
  Filled 2014-05-31 (×2): qty 1

## 2014-05-31 MED ORDER — AMLODIPINE BESYLATE 5 MG PO TABS
5.0000 mg | ORAL_TABLET | Freq: Every day | ORAL | Status: DC
  Filled 2014-05-31 (×2): qty 1

## 2014-05-31 MED ORDER — ACETAMINOPHEN 325 MG PO TABS: 650.0000 mg | ORAL_TABLET | Freq: Four times a day (QID) | ORAL | Status: DC | PRN

## 2014-05-31 MED ORDER — KETOROLAC TROMETHAMINE 10 MG PO TABS
10.0000 mg | ORAL_TABLET | Freq: Four times a day (QID) | ORAL | Status: DC | PRN
  Administered 2014-06-01: 10 mg via ORAL
  Filled 2014-05-31: qty 1

## 2014-05-31 MED ORDER — NICOTINE 21 MG/24HR TD PT24: 21.0000 mg | MEDICATED_PATCH | Freq: Every day | TRANSDERMAL | Status: DC

## 2014-05-31 MED ORDER — GABAPENTIN 300 MG PO CAPS
600.0000 mg | ORAL_CAPSULE | Freq: Three times a day (TID) | ORAL | Status: DC
  Administered 2014-05-31 – 2014-06-01 (×2): 600 mg via ORAL
  Filled 2014-05-31 (×10): qty 2

## 2014-05-31 MED ORDER — ATORVASTATIN CALCIUM 80 MG PO TABS: 80.0000 mg | ORAL_TABLET | Freq: Every day | ORAL | Status: DC

## 2014-05-31 MED ORDER — ISOSORBIDE MONONITRATE ER 30 MG PO TB24
30.0000 mg | ORAL_TABLET | Freq: Every day | ORAL | Status: DC
  Administered 2014-05-31: 30 mg via ORAL
  Filled 2014-05-31: qty 1

## 2014-05-31 MED ORDER — AMITRIPTYLINE HCL 25 MG PO TABS
100.0000 mg | ORAL_TABLET | Freq: Every evening | ORAL | Status: DC | PRN
  Administered 2014-05-31: 100 mg via ORAL
  Filled 2014-05-31: qty 4

## 2014-05-31 MED ORDER — KETOROLAC TROMETHAMINE 15 MG/ML IJ SOLN
15.0000 mg | Freq: Once | INTRAMUSCULAR | Status: DC
  Filled 2014-05-31: qty 1

## 2014-05-31 MED ORDER — HYDROXYZINE HCL 25 MG PO TABS
25.0000 mg | ORAL_TABLET | Freq: Every evening | ORAL | Status: DC | PRN
  Administered 2014-05-31: 25 mg via ORAL
  Filled 2014-05-31: qty 1

## 2014-05-31 NOTE — Tx Team (Signed)
Initial Interdisciplinary Treatment Plan   PATIENT STRESSORS: Health problems Occupational concerns Substance abuse   PATIENT STRENGTHS: Ability for insight Capable of independent living   PROBLEM LIST: Problem List/Patient Goals Date to be addressed Date deferred Reason deferred Estimated date of resolution  Pain in right hand 05/31/14     Suicidal thoughts  05/31/14                                                DISCHARGE CRITERIA:  Ability to meet basic life and health needs Adequate post-discharge living arrangements Improved stabilization in mood, thinking, and/or behavior Verbal commitment to aftercare and medication compliance  PRELIMINARY DISCHARGE PLAN: Attend aftercare/continuing care group Attend PHP/IOP  PATIENT/FAMIILY INVOLVEMENT: This treatment plan has been presented to and reviewed with the patient, Shirley Short, and/or family member.  The patient and family have been given the opportunity to ask questions and make suggestions.  Shirley Short L 05/31/2014, 6:20 PM

## 2014-05-31 NOTE — Progress Notes (Addendum)
Admission note: Pt presents easily agitated. Pt stated that she needs pain meds for her right hand and rated her pain 10/10. Pt stated that she have frequent falls because her equilibrium is off. Pt is manipulative and c/o wanting to go to the ED for pain meds. Pt stated that she would rather die than to be in pain. Pt easily agitated during admission  process and uncooperative. Pt gait is steady. Pt given a wheelchair for previous hx of falls. Pt given ibuprofen for pain and then became argumentative with RN and stated that she was leaving, going to ED. Minerva AreolaEric, [facility]. Pt has a dressing to right d/t I and D on 2/29. Wound consult ordered per Nicole KindredAgnes, NP. Pt dressing [address]

## 2014-05-31 NOTE — Progress Notes (Signed)
Patients states that her personal belonging missing, to include clothes,eight dollars and glasses.Belonging at the nurses station returned to patient.These include a pair of broken eye glasses,pocket book,purse,shoes.

## 2014-05-31 NOTE — Progress Notes (Signed)
Patient examined at bedside and dressing taken down   Will need daily dressing changes and PO cipro 750 BID   Will need to see in my office in the next 5-7 days

## 2014-05-31 NOTE — Progress Notes (Signed)
[facility], will call again in 15min

## 2014-05-31 NOTE — Progress Notes (Addendum)
Patient Profile: This 54 year old female has a history of insulin-dependent diabetes, hypertension, hyperlipidemia, COPD and cocaine abuse (UDS+) admitted for chest pain.    Subjective: Complains of continued pain issues (chronic). Also note recurrence of intermittent SSCP. Asking for pain meds (Dilaudid). States that she feels better having a Comptrollersitter. Currently w/o thoughts of suicide. She did state that she self medicates with cocaine for pain.   Objective: Vital signs in last 24 hours: Temp:  [97.8 F (36.6 C)-98.9 F (37.2 C)] 98 F (36.7 C) (03/08 0341) Pulse Rate:  [51-92] 54 (03/08 0341) Resp:  [18] 18 (03/07 1905) BP: (99-158)/(56-75) 144/58 mmHg (03/08 0341) SpO2:  [7 %-99 %] 98 % (03/08 0341) Last BM Date:  (pt reports about 2 weeks ago)  Intake/Output from previous day: 03/07 0701 - 03/08 0700 In: 380 [P.O.:360; I.V.:20] Out: -  Intake/Output this shift: Total I/O In: 240 [P.O.:240] Out: -   Medications Current Facility-Administered Medications  Medication Dose Route Frequency Provider Last Rate Last Dose  . 0.9 %  sodium chloride infusion   Intravenous Continuous Lennette Biharihomas A Kelly, MD 10 mL/hr at 05/26/14 2000    . acetaminophen (TYLENOL) tablet 650 mg  650 mg Oral Q4H PRN Othella BoyerWilliam S Tilley, MD   650 mg at 05/29/14 1515  . ALPRAZolam Prudy Feeler(XANAX) tablet 1 mg  1 mg Oral TID PRN Lennette Biharihomas A Kelly, MD   1 mg at 05/31/14 0344  . amitriptyline (ELAVIL) tablet 100 mg  100 mg Oral QHS Dairl PonderMatthew Weingold, MD   100 mg at 05/30/14 2149  . aspirin EC tablet 81 mg  81 mg Oral Daily Othella BoyerWilliam S Tilley, MD   81 mg at 05/31/14 0948  . atorvastatin (LIPITOR) tablet 80 mg  80 mg Oral q1800 Othella BoyerWilliam S Tilley, MD   80 mg at 05/30/14 1731  . doxycycline (VIBRA-TABS) tablet 100 mg  100 mg Oral Q12H Laurey Moralealton S McLean, MD   100 mg at 05/31/14 0949  . gabapentin (NEURONTIN) capsule 300 mg  300 mg Oral QHS Dairl PonderMatthew Weingold, MD   300 mg at 05/30/14 2200  . gabapentin (NEURONTIN) tablet 600 mg  600 mg Oral TID  AC Lennette Biharihomas A Kelly, MD   600 mg at 05/31/14 0948  . heparin injection 5,000 Units  5,000 Units Subcutaneous 3 times per day Lennette Biharihomas A Kelly, MD   5,000 Units at 05/31/14 [phone]  . HYDROcodone-acetaminophen (NORCO/VICODIN) 5-325 MG per tablet 1 tablet  1 tablet Oral Q6H PRN Lennette Biharihomas A Kelly, MD   1 tablet at 05/31/14 [phone]  . insulin aspart (novoLOG) injection 0-20 Units  0-20 Units Subcutaneous TID WC Othella BoyerWilliam S Tilley, MD   4 Units at 05/31/14 [phone]  . insulin aspart (novoLOG) injection 0-5 Units  0-5 Units Subcutaneous QHS Lennette Biharihomas A Kelly, MD   2 Units at 05/27/14 2229  . insulin aspart (novoLOG) injection 5 Units  5 Units Subcutaneous TID WC Lennette Biharihomas A Kelly, MD   5 Units at 05/31/14 0831  . insulin glargine (LANTUS) injection 50 Units  50 Units Subcutaneous QHS Lennette Biharihomas A Kelly, MD   50 Units at 05/30/14 2149  . lisdexamfetamine (VYVANSE) capsule 20 mg  20 mg Oral q morning - 10a Dairl PonderMatthew Weingold, MD   20 mg at 05/31/14 1017  . lisinopril (PRINIVIL,ZESTRIL) tablet 40 mg  40 mg Oral Daily Laurey Moralealton S McLean, MD   40 mg at 05/31/14 0949  . metFORMIN (GLUCOPHAGE) tablet 500 mg  500 mg Oral BID WC Lennette Biharihomas A Kelly, MD   500 mg  at 05/31/14 0831  . morphine 4 MG/ML injection 4 mg  4 mg Intravenous Daily Rhonda G Barrett, PA-C   4 mg at 05/31/14 1017  . naproxen (NAPROSYN) tablet 500 mg  500 mg Oral BID WC Lennette Bihari, MD   500 mg at 05/31/14 1610  . nicotine (NICODERM CQ - dosed in mg/24 hours) patch 21 mg  21 mg Transdermal Daily Glori Luis, MD   21 mg at 05/31/14 1021  . nitroGLYCERIN (NITROSTAT) SL tablet 0.4 mg  0.4 mg Sublingual Q5 Min x 3 PRN Othella Boyer, MD      . ondansetron [facility]) injection 4 mg  4 mg Intravenous Q6H PRN Othella Boyer, MD   4 mg at 05/24/14 [phone]  . pregabalin (LYRICA) capsule 100 mg  100 mg Oral Q8H PRN Dairl Ponder, MD   100 mg at 05/29/14 1802  . senna-docusate (Senokot-S) tablet 1 tablet  1 tablet Oral BID Lennette Bihari, MD   1 tablet at 05/31/14 [phone]  . sodium  chloride 0.9 % injection 10-40 mL  10-40 mL Intracatheter Q12H Corky Crafts, MD   30 mL at 05/30/14 1048  . sodium chloride 0.9 % injection 10-40 mL  10-40 mL Intracatheter PRN Corky Crafts, MD   10 mL at 05/31/14 8119    PE: General appearance: alert, cooperative, no distress and dentition in poor repair. She was angry at first but calmed down.  Neck: no carotid bruit and no JVD Lungs: clear to auscultation bilaterally Heart: regular rate and rhythm, S1, S2 normal, no murmur, click, rub or gallop Extremities: no LEE; right UE dressed with mild hand edema  Pulses: 2+ and symmetric Skin: warm and dry Neurologic: Grossly normal  Lab Results:   Recent Labs  05/30/14 0305  WBC 8.9  HGB 12.2  HCT 37.7  PLT 210   BMET  Recent Labs  05/29/14 0548 05/30/14 0305  NA 136 137  K 4.7 4.8  CL 101 98  CO2 31 31  GLUCOSE 152* 182*  BUN 8 11  CREATININE 0.59 0.70  CALCIUM 9.2 9.6   PT/INR No results for input(s): LABPROT, INR in the last 72 hours. Cholesterol No results for input(s): CHOL in the last 72 hours. Cardiac Enzymes Invalid input(s): TROPONIN,  CKMB  Studies/Results: LHC 05/23/14 1. Left main; normal  2. LAD; 70% smooth segmental distal. There was a high diagonal branch which was small in caliber that had 70-80% segmental mid stenosis 3. Left circumflex; codominant and free of significant disease.  4. Right coronary artery; codominant with 75% segmental PLA stenosis. The PDA arose high in the RCA 5. Left ventriculography; RAO left ventriculogram was performed using  25 mL of Visipaque dye at 12 mL/second. The overall LVEF estimated  60 % Without wall motion abnormalities  Assessment/Plan  Principal Problem:   MDD (major depressive disorder), recurrent, severe, with psychosis Active Problems:   Insulin dependent diabetes mellitus   Cocaine abuse   Noncompliance   CAD (coronary artery disease), native coronary artery   Obesity (BMI 30-39.9)    COPD (chronic obstructive pulmonary disease)   Acute coronary syndrome  1. CAD: Admitted with chest pain, 1 elevated troponin to 0.1. LHC showed 2 v CAD with slight progression from 2010 (see cath findings above). Dr. Tresa Endo and Dr. Allyson Sabal have discussed films at length. Given continued cocaine use and possible vasospasm we all agree that medical therapy not PCI is most prudent. I discussed with her at length. I  discussed high mortality risk with continued cocaine and other drug use.  Statin started. Given slight progression of disease and distal aspect of disease there is high likelihood that adding a stent/ PCI will not help her with her general chronic pain complaints and infact may cause more harm then benefit (i.e. Increased risk of stent thrombosis with low likelihood of medication compliance).  2. Hypertension: on cardizem/lisinopril. Lisinopril increased to 40 mg daily.   3. Hyperlipidemia: LDL 140; atorvastatin initiated. Recheck FLP and LFTs in 6 weeks.  4. R hand superficial abscess: Underwent I&D in OR 3/2 with general anesthesia; now on doxycycline. Surgery repacked today. Ortho to see. Appreciate call to ortho from [facility]le[facility] Jews.  5. DM: Not well controlled. HbA1c 12.1. Meds were adjusted over the weekend. Glucose levels are slightly improved. Will need close f/u with PCP.    6. Tobacco abuse:  smoking cessation strongly advised; currently on nicotine patch.  7. Cocaine use: UDS time of admit was +.  Discussed avoidance of social contacts.   8. Chronic pain: Frequently asking for pain medications. She admits that she self medicates with cocaine because she does not get anything for her pain.   9. Psych: Suicidal ideation with plan. Patient now feeling a bit better about herself. Has sitter. Psych. Has a psych bed now available.   10. Bradycardia - happened after adding Bystolic  to diltiazem . Currently both are held and she is sinus rhythm in the 60's. I will add  amlodipine  and isosorbide 30 as antianginal. BP is improved. If tachycardia returns could add low dose Bystolic 2.5 or change amlodipine back to low dose diltiazem 120.   OK for DC.    LOS: 8 days    Donato Schultz, MD

## 2014-05-31 NOTE — Progress Notes (Signed)
The focus of this group is to help patients review their daily goal of treatment and discuss progress on daily workbooks. Pt did not attend the evening group. 

## 2014-05-31 NOTE — Progress Notes (Signed)
D:Patient in bed on approach.  Patient is demanding pain medications stating she needs stringer pain medication then what is ordered for her.  Patient states, "If I have to be in this much pain I would rather not see the sun rise tomorrow.  Patient states she is passive SI but verbally contracts for safety.  Also when changes were made to her pain medication tonight patient stated it was not going to work before even trying different measures.  Patient wants percocet which she named.  Patient denies HI/AVH. A: Staff to monitor Q 15 mins for safety.  Encouragement and support offered.  Scheduled medications administered per orders. R: Patient remains safe on the unit.  Patient did not attend group tonight.  Patient visible on the unit and interacting with peers.  Patient taking administered medications.

## 2014-05-31 NOTE — Telephone Encounter (Signed)
New Message      [facility] at 8:00 w/ Tereso NewcomerScott Weaver.

## 2014-05-31 NOTE — Progress Notes (Signed)
Physical Therapy Wound Treatment Patient Details  Name: Shirley Short MRN: [id number] Date of Birth: 1960-12-27  Today's Date: 05/31/2014 Time: 5732-2025 Time Calculation (min): 37 min  Subjective  Subjective: You are not doing this until I get more pain meds Patient and Family Stated Goals: get me out of here Prior Treatments: s/p I &D 3/2.  Pain Score: Pain Score: 10-Worst pain ever Pt very upset with care. Adamant about hydrotherapy not being done until MD orders more pain medication to tolerance. RN called MD and ordered 1 time dose of morphine.  Wound Assessment  Wound / Incision (Open or Dehisced) 05/26/14 Other (Comment) Hand Right Dorsum right hand (Active)  Dressing Type Compression wrap;Gauze (Comment) 05/31/2014 10:44 AM  Dressing Changed New 05/31/2014 10:44 AM  Dressing Status Clean;Dry;Intact 05/31/2014 10:44 AM  Dressing Change Frequency Daily 05/31/2014 10:44 AM  Site / Wound Assessment Granulation tissue;Red;Painful 05/31/2014 10:44 AM  % Wound base Red or Granulating 90% 05/31/2014 10:44 AM  % Wound base Yellow 0% 05/31/2014 10:44 AM  % Wound base Black 0% 05/31/2014 10:44 AM  % Wound base Other (Comment) 10% 05/31/2014 10:44 AM  Peri-wound Assessment Edema;Induration;Maceration;Pink 05/31/2014 10:44 AM  Wound Length (cm) 3.9 cm 05/26/2014 12:00 PM  Wound Width (cm) 0.7 cm 05/26/2014 12:00 PM  Wound Depth (cm) 1.3 cm 05/26/2014 12:00 PM  Undermining (cm) undermining 2.1 cm at 2:00; 1.8 cm at 8:00-10:00 05/26/2014 12:00 PM  Margins Unattached edges (unapproximated) 05/31/2014 10:44 AM  Closure None 05/31/2014 10:44 AM  Drainage Amount Minimal 05/31/2014 10:44 AM  Drainage Description Serosanguineous;No odor 05/31/2014 10:44 AM  Treatment Cleansed;Debridement (Selective);Hydrotherapy (Pulse lavage);Packing (Plain strip) 05/31/2014 10:44 AM     Incision (Closed) 05/25/14 Hand Right (Active)  Dressing Type Gauze (Comment);Compression wrap 05/30/2014  9:04 PM  Dressing Changed 05/30/2014  9:04 PM  Dressing  Change Frequency Daily 05/27/2014  9:00 AM  Site / Wound Assessment Dry;Pink;Painful 05/30/2014  9:04 PM  Margins Unattached edges (unapproximated) 05/30/2014  9:04 PM  Drainage Amount Scant 05/30/2014  9:04 PM  Drainage Description Serous;Purulent 05/30/2014  9:04 PM   Hydrotherapy Pulsed lavage therapy - wound location: dorsum right hand Pulsed Lavage with Suction (psi): 4 psi Pulsed Lavage with Suction - Normal Saline Used: 1000 mL Pulsed Lavage Tip: Tip with splash shield Selective Debridement Selective Debridement - Location: dorsum right hand Selective Debridement - Tools Used: Other (comment) (slough by qtip) Selective Debridement - Tissue Removed: slough   Wound Assessment and Plan  Wound Therapy - Assess/Plan/Recommendations Wound Therapy - Clinical Statement: Pt s/p I&D 3/2 to dorsum of right hand. Visible wound bed is red, granulation tissue with exposed tendon with 2 areas of undermining. Pt would benefit from PLS to cleanse the wound, prevent further I&D's in OR, decrease bacterial load and promote wound healing. Anticipate pt will only need a few visits prior to d/c due to clean wound bed. Wound Therapy - Functional Problem List: decreased AROM digits, wrist.  Factors Delaying/Impairing Wound Healing: Infection - systemic/local;Multiple medical problems;Polypharmacy;Substance abuse Hydrotherapy Plan: Debridement;Dressing change;Patient/family education;Pulsatile lavage with suction Wound Therapy - Frequency: 6X / week Wound Therapy - Current Recommendations: WOC nurse;Surgery consult Wound Therapy - Follow Up Recommendations: Home health RN;Other (comment) (Inpatient psych admission) Wound Plan: see above  Wound Therapy Goals- Improve the function of patient's integumentary system by progressing the wound(s) through the phases of wound healing (inflammation - proliferation - remodeling) by: Decrease Necrotic Tissue to: <10% excluding exposed tendon Decrease Necrotic Tissue -  Progress: Progressing toward goal Increase  Granulation Tissue to: 100% Increase Granulation Tissue - Progress: Progressing toward goal Improve Drainage Characteristics: Min Improve Drainage Characteristics - Progress: Met Patient/Family will be able to : performing dressing changes independently Patient/Family Instruction Goal - Progress: Not met Additional Wound Therapy Goal: demonstrate and verbalize AROM exercises of [name]. Additional Wound Therapy Goal - Progress: Progressing toward goal Goals/treatment plan/discharge plan were made with and agreed upon by patient/family: Yes Time For Goal Achievement: 6 days Wound Therapy - Potential for Goals: Good  Goals will be updated until maximal potential achieved or discharge criteria met.  Discharge criteria: when goals achieved, discharge from hospital, MD decision/surgical intervention, no progress towards goals, refusal/missing three consecutive treatments without notification or medical reason.  GP     Candy Sledge A 05/31/2014, 10:47 AM Candy Sledge, PT, DPT [phone]

## 2014-05-31 NOTE — Discharge Summary (Signed)
Discharge Summary   Patient ID: Shirley Short MRN: [id number], DOB/AGE: 54-May-1962 54 y.o. Admit date: 05/23/2014 D/C date:     05/31/2014  Primary Cardiologist: Dr Donnie Aho ( saw in 2010 and admitted 05/23/14 but followed by [facility] rest of admission). She should follow with Dr Tresa Endo or in [place] depending on her preference.   Principal Problem:   Chronic pain disorder Active Problems:   Insulin dependent diabetes mellitus   Cocaine abuse   Noncompliance   CAD (coronary artery disease), native coronary artery   Obesity (BMI 30-39.9)   COPD (chronic obstructive pulmonary disease)   Tobacco abuse   Hypertension   Hyperlipidemia   MDD (major depressive disorder), recurrent, severe, with psychosis   Suicidal ideation   Abscess of right hand   CAD (coronary artery disease)    Admission Dates: 05/23/14-05/31/14 Discharge Diagnosis: Chest pain s/p LHC with non obstructive disease and continued on medical management. Later she admitted to suicidal ideation with a plan and she will be discharged to behavioral health.   HPI: Shirley Short is a 54 y.o. female with a history of continued tobacco abuse, polysubstance abuse (cocaine/opiates), chronic pain syndrome, major depressive disorder, COPD, DM, HLD who was transferred from [facility] hospital on 05/23/14 as a CODE STEMI. She presented with ongoing chest pain and LBBB. When she presented to [facility], her cardiac enzymes were noted to be normal, the CODE STEMI was cancelled and it was decided to proceed with further medical work up.   She had a heart catheterization performed by Dr. Donnie Aho in 2010 which showed coronary vasospasm and  distal LAD and RCA disease. Medical therapy was recommended. She also has a prior psychiatric disorder according to the old records and has continued to use cocaine and smoking heavily. She evidently has a chronic pain syndrome and in the past he used Fentanyl patches although she states the present time that she is not using  pain medicines. She complains of episodic chest pain for some time. She developed severe chest pain at midnight the night before admission and had some sharp pains involving the left side of her neck. She presented to the [facility] emergency room where she refused nitroglycerin and had to have a central line inserted because of failure to obtain an IV peripherally. After discussion with the emergency room doctor she was transferred to [facility] as a code STEMI. After consultation with interventional cardiologist, the interventional cardiologist felt that she should have further evaluation prior to going directly to the cardiac catheterization laboratory. On arrival the patient was having ongoing chest discomfort although she did not appear to be acutely ill. She was belligerent and refused intravenous nitroglycerin or sublingual nitroglycerin. The patient addmitted to snorting cocaine within the past 3-5 days.   Hospital Course  CAD: Admitted with chest pain, 1 elevated troponin to 0.1. LHC showed 2 v CAD with slight progression from 2010 (see cath findings below) Unable to give IC NTG during study to assess for coronary spasm. Dr. Tresa Endo and Dr. Allyson Sabal have discussed films at length. Given continued cocaine use and possible vasospasm we all agree that medical therapy not PCI is most prudent. This was discussed with her at length and she knows the high mortality risk with continued cocaine and other drug use. Statin started. Given slight progression of disease and distal aspect of disease there is high likelihood that adding a stent/ PCI will not help her with her general chronic pain complaints and infact may cause  more harm then benefit (i.e. Increased risk of stent thrombosis with low likelihood of medication compliance). -- 2D ECHO with normal LV size with mild LV hypertrophy. EF 25-30% with wall motion abnormalities: Anteroseptal, inferoseptal, and inferiorsevere hypokinesis.  Septal-lateral dyssynchrony. Normal RV size and systolic function. No significant valvular abnormalities. -- Continue ASA, statin, and ACE. No BB due to cocaine use and bradycardia.  (If beta blocker is needed would only use bystolic with nitric oxide mediated potential for vasodilation in light of cocaine history.)   Hypertension: Now well controlled. She had some hypotension during LHC which improved with fluids and dopamine. -- Currently on Lisinopril 40 mg daily, amlodipine 5mg , and imdur 30mg ,   Hyperlipidemia: LDL 140; atorvastatin initiated. Recheck FLP and LFTs in 6 weeks (mid April 2016)   R hand superficial abscess: Underwent I&D in OR 05/25/14 with general anesthesia; now on doxycycline. PT repacked today.  -- She has received 2.5 day of Doxy 100mg  BID.  -- Dr. Mina MarbleWeingold saw her today and recommended cipro 750mg  BID and that she be seen in the office in 5-7 days. I arranged an appointment for next Tuesday 06/07/14. It turns out she has an allergy to FQs so Dr. Mina MarbleWeingold said it was okay to continued Doxy 100mg  BID for 20 days. I have called this in. She will need daily wound changes which is being arranged by SW.  DM: Not well controlled. HbA1c 12.1. Meds were adjusted over the weekend. Glucose levels are slightly improved. Will need close f/u with PCP.  -- Started on gabapentin for peripheral neuropathy.  Tobacco abuse: smoking cessation strongly advised; currently on nicotine patch.  Cocaine use: UDS time of admit was +. Discussed avoidance of social contacts.   Chronic pain: Frequently asking for pain medications. She admits that she self medicates with cocaine because she does not get anything for her pain.   Psych/Major mood disorder: Suicidal ideation with plan. Patient now feeling a bit better about herself. Has sitter. Psych. Has a psych bed now available.   Bradycardia - (initially tachycardic on DA for hypotension during cath. She was treated with Bystolic 5mg  and  diltiazem 300mg .) She then became bradycardic and both were held. She is now in sinus rhythm in the 60's. -- Amlodipine 5mg  and isosorbide 30 addded today as antianginal. BP is improved. If tachycardia returns could add low dose Bystolic 2.5 or change amlodipine back to low dose diltiazem 120.   The patient has had an uncomplicated hospital course and is recovering well. The radial catheter site is stable.  She has been seen by Dr. Anne FuSkains today and deemed ready for discharged to [facility]. All follow-up appointments have been scheduled. Smoking cessation was disscussed in length. Discharge medications are listed below.   Discharge Vitals: Blood pressure 141/61, pulse 70, temperature 98 F (36.7 C), temperature source Oral, resp. rate 18, height 5\' 4"  (1.626 m), weight 163 lb 12.8 oz (74.3 kg), SpO2 98 %.  Labs: Lab Results  Component Value Date   WBC 8.9 05/30/2014   HGB 12.2 05/30/2014   HCT 37.7 05/30/2014   MCV 83.2 05/30/2014   PLT 210 05/30/2014     Recent Labs Lab 05/30/14 0305  NA 137  K 4.8  CL 98  CO2 31  BUN 11  CREATININE 0.70  CALCIUM 9.6  GLUCOSE 182*   No results for input(s): CKTOTAL, CKMB, TROPONINI in the last 72 hours. Lab Results  Component Value Date   CHOL 215* 05/24/2014  HDL 46 05/24/2014   LDLCALC 140* 05/24/2014   TRIG 145 05/24/2014     Diagnostic Studies/Procedures     05/23/2014 CARDIAC CATHETERIZATION   History obtained from chart review. Mrs. Coil if a 54 year old Caucasian Female admitted in transfer from [facility] as a "STEMI". She does have left bundle branch block. Her risk factors include COPD with continued tobacco abuse, diabetes and cocaine abuse. She had a heart catheterization performed by Dr. Donnie Aho in 2010 which showed distal LAD and RCA disease. Local therapy was recommended. She has had continued chest pain with negative troponins. She presents now for diagnostic coronary  arteriography  HEMODYNAMICS:  AO SYSTOLIC/AO DIASTOLIC: 72/36 LV SYSTOLIC/LV DIASTOLIC: 77/12 ANGIOGRAPHIC RESULTS:  1. Left main; normal  2. LAD; 70% smooth segmental distal. There was a high diagonal branch which was small in caliber that had 70-80% segmental mid stenosis 3. Left circumflex; codominant and free of significant disease.  4. Right coronary artery; codominant with 75% segmental PLA stenosis. The PDA arose high in the RCA 5. Left ventriculography; RAO left ventriculogram was performed using  25 mL of Visipaque dye at 12 mL/second. The overall LVEF estimated  60 % Without wall motion abnormalities IMPRESSION:Mrs. Santone has moderate distal LAD and RCA disease. These seem to have progressed since her last 6 years ago. Her systolic pressure was too low to administer intracoronary nitroglycerin to determine whether there was an element of spasm. I'm going to review the films with my colleagues prior to making a final determination with regards treatment options. The sheath was removed and a TR band was placed on the right wrist to achieve patent hemostasis. The patient left the cath lab in stable condition.    Study Date: 05/23/2014 LV EF: 25% -   30% Study Conclusions - Left ventricle: The cavity size was normal. Wall thickness was   increased in a pattern of mild LVH. Systolic function was   severely reduced. The estimated ejection fraction was in the   range of 25% to 30%. Anteroseptal, inferoseptal, and inferior   severe hypokinesis. Septal-lateral dyssynchrony. Doppler   parameters are consistent with abnormal left ventricular   relaxation (grade 1 diastolic dysfunction). - Aortic valve: There was no stenosis. - Mitral valve: Mildly calcified annulus. Mildly calcified leaflets   . There was no significant regurgitation. - Right ventricle: The cavity size was normal. Systolic function   was normal. - Pulmonary arteries: No complete TR doppler jet so unable to    estimate PA systolic pressure. - Inferior vena cava: The vessel was normal in size. The   respirophasic diameter changes were in the normal range (>= 50%),   consistent with normal central venous pressure. - Pericardium, extracardiac: A trivial pericardial effusion was   identified. Impressions: - Normal LV size with mild LV hypertrophy. EF 25-30% with wall   motion abnormalities as noted above. Normal RV size and systolic   function. No significant valvular abnormalities.    Discharge Medications     Medication List    TAKE these medications        amitriptyline 100 MG tablet  Commonly known as:  ELAVIL  Take 1 tablet by mouth at bedtime.     amLODipine 5 MG tablet  Commonly known as:  NORVASC  Take 1 tablet (5 mg total) by mouth daily.     aspirin 81 MG EC tablet  Take 1 tablet (81 mg total) by mouth daily.     atorvastatin 80 MG tablet  Commonly known  as:  LIPITOR  Take 1 tablet (80 mg total) by mouth daily at 6 PM.     doxycycline 100 MG tablet  Commonly known as:  VIBRA-TABS  Take 1 tablet (100 mg total) by mouth every 12 (twelve) hours.     gabapentin 300 MG capsule  Commonly known as:  NEURONTIN  Take 2 capsules (600 mg total) by mouth 3 (three) times daily.     isosorbide mononitrate 30 MG 24 hr tablet  Commonly known as:  IMDUR  Take 1 tablet (30 mg total) by mouth daily.     lansoprazole 30 MG capsule  Commonly known as:  PREVACID  Take 30 mg by mouth daily.     lisinopril 40 MG tablet  Commonly known as:  PRINIVIL,ZESTRIL  Take 1 tablet (40 mg total) by mouth daily.     LYRICA 100 MG capsule  Generic drug:  pregabalin  Take 1 capsule by mouth every 8 (eight) hours as needed.     metFORMIN 500 MG tablet  Commonly known as:  GLUCOPHAGE  Take 500 mg by mouth every 12 (twelve) hours.     nicotine 21 mg/24hr patch  Commonly known as:  NICODERM CQ - dosed in mg/24 hours  Place 1 patch (21 mg total) onto the skin daily.     VYVANSE 40 MG capsule   Generic drug:  lisdexamfetamine  Take 1 tablet by mouth every morning.        Disposition   The patient will be discharged in stable condition to home.  Follow-up Information    Follow up with Tereso Newcomer, PA-C On 06/14/2014.   Specialty:  Physician Assistant   Why:  @ 8am   Contact information:   [address] [place] [address] [phone]       Follow up with Marlowe Shores, MD On 06/07/2014.   Specialty:  Orthopedic Surgery   Why:  @ 9:15am    Contact information:   [address] [place] [address] [phone]         Duration of Discharge Encounter: Greater than 30 minutes including physician and PA time.  Shari Heritage, Donnalyn Juran R PA-C 05/31/2014, 1:42 PM

## 2014-05-31 NOTE — Progress Notes (Addendum)
Pt complaining about severe pain in her right hand, and states she was going to leave AMA. Called Dr. Shirlee LatchMclean made him aware of pt pain medication request and that pt states she would leave. Orders given. Encouraged pt to take medication as ordered, and instructed pt on importance of staying at hospital for care. Pt agreeable to take medication and to stay at hospital, will continue to monitor pt.

## 2014-06-01 ENCOUNTER — Inpatient Hospital Stay (HOSPITAL_COMMUNITY): Payer: Medicaid - Out of State

## 2014-06-01 ENCOUNTER — Encounter (HOSPITAL_COMMUNITY): Payer: Self-pay | Admitting: *Deleted

## 2014-06-01 ENCOUNTER — Other Ambulatory Visit: Payer: Self-pay

## 2014-06-01 ENCOUNTER — Inpatient Hospital Stay (HOSPITAL_COMMUNITY)
Admission: AD | Admit: 2014-06-01 | Discharge: 2014-06-04 | DRG: 603 | Disposition: A | Discharge status: Home / Self Care | Payer: Medicaid - Out of State | Source: Ambulatory Visit | Attending: Internal Medicine | Admitting: Internal Medicine

## 2014-06-01 DIAGNOSIS — Z881 Allergy status to other antibiotic agents status: Secondary | ICD-10-CM

## 2014-06-01 DIAGNOSIS — Z888 Allergy status to other drugs, medicaments and biological substances status: Secondary | ICD-10-CM

## 2014-06-01 DIAGNOSIS — F333 Major depressive disorder, recurrent, severe with psychotic symptoms: Secondary | ICD-10-CM | POA: Diagnosis present

## 2014-06-01 DIAGNOSIS — Z6833 Body mass index (BMI) 33.0-33.9, adult: Secondary | ICD-10-CM | POA: Diagnosis not present

## 2014-06-01 DIAGNOSIS — E785 Hyperlipidemia, unspecified: Secondary | ICD-10-CM | POA: Diagnosis present

## 2014-06-01 DIAGNOSIS — G894 Chronic pain syndrome: Secondary | ICD-10-CM | POA: Diagnosis present

## 2014-06-01 DIAGNOSIS — Z794 Long term (current) use of insulin: Secondary | ICD-10-CM

## 2014-06-01 DIAGNOSIS — L0390 Cellulitis, unspecified: Secondary | ICD-10-CM

## 2014-06-01 DIAGNOSIS — F1110 Opioid abuse, uncomplicated: Secondary | ICD-10-CM

## 2014-06-01 DIAGNOSIS — L02511 Cutaneous abscess of right hand: Secondary | ICD-10-CM | POA: Diagnosis present

## 2014-06-01 DIAGNOSIS — Z720 Tobacco use: Secondary | ICD-10-CM | POA: Diagnosis present

## 2014-06-01 DIAGNOSIS — F17210 Nicotine dependence, cigarettes, uncomplicated: Secondary | ICD-10-CM | POA: Diagnosis present

## 2014-06-01 DIAGNOSIS — I25111 Atherosclerotic heart disease of native coronary artery with angina pectoris with documented spasm: Secondary | ICD-10-CM | POA: Diagnosis present

## 2014-06-01 DIAGNOSIS — G8929 Other chronic pain: Secondary | ICD-10-CM | POA: Diagnosis present

## 2014-06-01 DIAGNOSIS — F332 Major depressive disorder, recurrent severe without psychotic features: Secondary | ICD-10-CM | POA: Diagnosis present

## 2014-06-01 DIAGNOSIS — Z7982 Long term (current) use of aspirin: Secondary | ICD-10-CM

## 2014-06-01 DIAGNOSIS — E119 Type 2 diabetes mellitus without complications: Secondary | ICD-10-CM

## 2014-06-01 DIAGNOSIS — L03113 Cellulitis of right upper limb: Secondary | ICD-10-CM | POA: Diagnosis present

## 2014-06-01 DIAGNOSIS — F1410 Cocaine abuse, uncomplicated: Secondary | ICD-10-CM | POA: Diagnosis present

## 2014-06-01 DIAGNOSIS — I10 Essential (primary) hypertension: Secondary | ICD-10-CM | POA: Diagnosis present

## 2014-06-01 DIAGNOSIS — F1424 Cocaine dependence with cocaine-induced mood disorder: Secondary | ICD-10-CM

## 2014-06-01 DIAGNOSIS — R45851 Suicidal ideations: Secondary | ICD-10-CM

## 2014-06-01 DIAGNOSIS — I2510 Atherosclerotic heart disease of native coronary artery without angina pectoris: Secondary | ICD-10-CM | POA: Diagnosis present

## 2014-06-01 DIAGNOSIS — Z79899 Other long term (current) drug therapy: Secondary | ICD-10-CM

## 2014-06-01 DIAGNOSIS — L0291 Cutaneous abscess, unspecified: Secondary | ICD-10-CM | POA: Diagnosis present

## 2014-06-01 DIAGNOSIS — E669 Obesity, unspecified: Secondary | ICD-10-CM | POA: Diagnosis present

## 2014-06-01 DIAGNOSIS — J449 Chronic obstructive pulmonary disease, unspecified: Secondary | ICD-10-CM | POA: Diagnosis present

## 2014-06-01 DIAGNOSIS — F29 Unspecified psychosis not due to a substance or known physiological condition: Secondary | ICD-10-CM | POA: Diagnosis present

## 2014-06-01 LAB — GLUCOSE, CAPILLARY
GLUCOSE-CAPILLARY: 170 mg/dL — AB (ref 70–99)
Glucose-Capillary: 144 mg/dL — ABNORMAL HIGH (ref 70–99)
Glucose-Capillary: 306 mg/dL — ABNORMAL HIGH (ref 70–99)
Glucose-Capillary: 267 mg/dL — ABNORMAL HIGH (ref 70–99)
Glucose-Capillary: 151 mg/dL — ABNORMAL HIGH (ref 70–99)

## 2014-06-01 MED ORDER — LISDEXAMFETAMINE DIMESYLATE 20 MG PO CAPS
40.0000 mg | ORAL_CAPSULE | Freq: Every morning | ORAL | Status: DC
  Administered 2014-06-02 – 2014-06-04 (×3): 40 mg via ORAL
  Filled 2014-06-01 (×3): qty 2

## 2014-06-01 MED ORDER — HYDROCODONE-ACETAMINOPHEN 5-325 MG PO TABS
1.0000 | ORAL_TABLET | ORAL | Status: DC | PRN
  Administered 2014-06-01 – 2014-06-02 (×3): 2 via ORAL
  Administered 2014-06-02: 1 via ORAL
  Administered 2014-06-02 – 2014-06-04 (×9): 2 via ORAL
  Filled 2014-06-01 (×14): qty 2

## 2014-06-01 MED ORDER — ISOSORBIDE MONONITRATE ER 30 MG PO TB24
30.0000 mg | ORAL_TABLET | Freq: Every day | ORAL | Status: DC
  Administered 2014-06-01 – 2014-06-04 (×4): 30 mg via ORAL
  Filled 2014-06-01 (×4): qty 1

## 2014-06-01 MED ORDER — LISINOPRIL 40 MG PO TABS
40.0000 mg | ORAL_TABLET | Freq: Every day | ORAL | Status: DC
  Administered 2014-06-01 – 2014-06-04 (×4): 40 mg via ORAL
  Filled 2014-06-01 (×4): qty 1

## 2014-06-01 MED ORDER — DEXTROSE 5 % IV SOLN
1.0000 g | Freq: Three times a day (TID) | INTRAVENOUS | Status: DC
  Administered 2014-06-02 – 2014-06-04 (×7): 1 g via INTRAVENOUS
  Filled 2014-06-01 (×10): qty 1

## 2014-06-01 MED ORDER — ALPRAZOLAM 1 MG PO TABS
1.0000 mg | ORAL_TABLET | Freq: Once | ORAL | Status: AC
  Administered 2014-06-01: 1 mg via ORAL
  Filled 2014-06-01: qty 1

## 2014-06-01 MED ORDER — ASPIRIN EC 81 MG PO TBEC
81.0000 mg | DELAYED_RELEASE_TABLET | Freq: Every day | ORAL | Status: DC
  Administered 2014-06-01 – 2014-06-04 (×4): 81 mg via ORAL
  Filled 2014-06-01 (×4): qty 1

## 2014-06-01 MED ORDER — ENOXAPARIN SODIUM 40 MG/0.4ML ~~LOC~~ SOLN
40.0000 mg | SUBCUTANEOUS | Status: DC
  Administered 2014-06-01 – 2014-06-03 (×3): 40 mg via SUBCUTANEOUS
  Filled 2014-06-01 (×4): qty 0.4

## 2014-06-01 MED ORDER — SENNOSIDES-DOCUSATE SODIUM 8.6-50 MG PO TABS: 1.0000 | ORAL_TABLET | Freq: Every evening | ORAL | Status: DC | PRN

## 2014-06-01 MED ORDER — ACETAMINOPHEN 325 MG PO TABS: 650.0000 mg | ORAL_TABLET | Freq: Four times a day (QID) | ORAL | Status: DC | PRN

## 2014-06-01 MED ORDER — ALBUTEROL SULFATE (2.5 MG/3ML) 0.083% IN NEBU: 2.5000 mg | INHALATION_SOLUTION | RESPIRATORY_TRACT | Status: DC | PRN

## 2014-06-01 MED ORDER — INSULIN ASPART 100 UNIT/ML ~~LOC~~ SOLN
0.0000 [IU] | Freq: Three times a day (TID) | SUBCUTANEOUS | Status: DC
Start: 2014-06-02 — End: 2014-06-04
  Administered 2014-06-02: 3 [IU] via SUBCUTANEOUS
  Administered 2014-06-02 – 2014-06-03 (×4): 2 [IU] via SUBCUTANEOUS
  Administered 2014-06-04: 3 [IU] via SUBCUTANEOUS

## 2014-06-01 MED ORDER — AMLODIPINE BESYLATE 5 MG PO TABS
5.0000 mg | ORAL_TABLET | Freq: Every day | ORAL | Status: DC
  Administered 2014-06-01 – 2014-06-04 (×4): 5 mg via ORAL
  Filled 2014-06-01 (×4): qty 1

## 2014-06-01 MED ORDER — ATORVASTATIN CALCIUM 80 MG PO TABS
80.0000 mg | ORAL_TABLET | Freq: Every day | ORAL | Status: DC
  Administered 2014-06-02 – 2014-06-04 (×3): 80 mg via ORAL
  Filled 2014-06-01 (×3): qty 1

## 2014-06-01 MED ORDER — PREGABALIN 50 MG PO CAPS
100.0000 mg | ORAL_CAPSULE | Freq: Every day | ORAL | Status: DC
  Administered 2014-06-01 – 2014-06-04 (×4): 100 mg via ORAL
  Filled 2014-06-01: qty 2
  Filled 2014-06-01: qty 4
  Filled 2014-06-01 (×3): qty 2

## 2014-06-01 MED ORDER — ONDANSETRON HCL 4 MG/2ML IJ SOLN
4.0000 mg | Freq: Four times a day (QID) | INTRAMUSCULAR | Status: DC | PRN
  Administered 2014-06-01 – 2014-06-04 (×7): 4 mg via INTRAVENOUS
  Filled 2014-06-01 (×8): qty 2

## 2014-06-01 MED ORDER — AMITRIPTYLINE HCL 10 MG PO TABS: 10.0000 mg | ORAL_TABLET | Freq: Every evening | ORAL | Status: DC | PRN

## 2014-06-01 MED ORDER — AMITRIPTYLINE HCL 25 MG PO TABS: 25.0000 mg | ORAL_TABLET | Freq: Every evening | ORAL | Status: DC | PRN

## 2014-06-01 MED ORDER — OXYCODONE-ACETAMINOPHEN 5-325 MG PO TABS
2.0000 | ORAL_TABLET | Freq: Once | ORAL | Status: AC
  Administered 2014-06-01: 2 via ORAL
  Filled 2014-06-01: qty 2

## 2014-06-01 MED ORDER — SODIUM CHLORIDE 0.9 % IV SOLN
1250.0000 mg | Freq: Two times a day (BID) | INTRAVENOUS | Status: DC
  Administered 2014-06-02 – 2014-06-04 (×5): 1250 mg via INTRAVENOUS
  Filled 2014-06-01 (×5): qty 1250

## 2014-06-01 MED ORDER — VANCOMYCIN HCL 10 G IV SOLR
2000.0000 mg | INTRAVENOUS | Status: AC
  Administered 2014-06-01: 2000 mg via INTRAVENOUS
  Filled 2014-06-01: qty 2000

## 2014-06-01 MED ORDER — OXYCODONE-ACETAMINOPHEN 5-325 MG PO TABS
1.0000 | ORAL_TABLET | Freq: Four times a day (QID) | ORAL | Status: DC | PRN
  Administered 2014-06-01: 1 via ORAL
  Filled 2014-06-01: qty 1

## 2014-06-01 MED ORDER — ALUM & MAG HYDROXIDE-SIMETH 200-200-20 MG/5ML PO SUSP: 30.0000 mL | Freq: Four times a day (QID) | ORAL | Status: DC | PRN

## 2014-06-01 MED ORDER — SODIUM CHLORIDE 0.9 % IV SOLN
INTRAVENOUS | Status: AC
  Administered 2014-06-01: 21:00:00 via INTRAVENOUS

## 2014-06-01 MED ORDER — MORPHINE SULFATE 2 MG/ML IJ SOLN
2.0000 mg | INTRAMUSCULAR | Status: DC | PRN
  Administered 2014-06-02 (×3): 2 mg via INTRAVENOUS
  Filled 2014-06-01 (×4): qty 1

## 2014-06-01 MED ORDER — AMITRIPTYLINE HCL 100 MG PO TABS
100.0000 mg | ORAL_TABLET | Freq: Every day | ORAL | Status: DC
  Administered 2014-06-01 – 2014-06-03 (×3): 100 mg via ORAL
  Filled 2014-06-01 (×4): qty 1

## 2014-06-01 MED ORDER — NICOTINE 21 MG/24HR TD PT24
21.0000 mg | MEDICATED_PATCH | Freq: Every day | TRANSDERMAL | Status: DC
  Administered 2014-06-01 – 2014-06-04 (×4): 21 mg via TRANSDERMAL
  Filled 2014-06-01 (×4): qty 1

## 2014-06-01 MED ORDER — INSULIN DETEMIR 100 UNIT/ML ~~LOC~~ SOLN
10.0000 [IU] | Freq: Every day | SUBCUTANEOUS | Status: DC
  Administered 2014-06-01: 10 [IU] via SUBCUTANEOUS
  Filled 2014-06-01 (×2): qty 0.1

## 2014-06-01 MED ORDER — ONDANSETRON HCL 4 MG PO TABS
4.0000 mg | ORAL_TABLET | Freq: Four times a day (QID) | ORAL | Status: DC | PRN
Start: 2014-06-01 — End: 2014-06-04

## 2014-06-01 MED ORDER — DULOXETINE HCL 30 MG PO CPEP
30.0000 mg | ORAL_CAPSULE | Freq: Every day | ORAL | Status: DC
  Filled 2014-06-01: qty 1

## 2014-06-01 MED ORDER — ZOLPIDEM TARTRATE 5 MG PO TABS
5.0000 mg | ORAL_TABLET | Freq: Every evening | ORAL | Status: DC | PRN
  Administered 2014-06-01 – 2014-06-02 (×2): 5 mg via ORAL
  Filled 2014-06-01 (×2): qty 1

## 2014-06-01 MED ORDER — PANTOPRAZOLE SODIUM 40 MG PO TBEC
40.0000 mg | DELAYED_RELEASE_TABLET | Freq: Every day | ORAL | Status: DC
  Administered 2014-06-01 – 2014-06-04 (×4): 40 mg via ORAL
  Filled 2014-06-01 (×4): qty 1

## 2014-06-01 MED ORDER — VANCOMYCIN HCL IN DEXTROSE 1-5 GM/200ML-% IV SOLN: 1000.0000 mg | Freq: Two times a day (BID) | INTRAVENOUS | Status: DC

## 2014-06-01 MED ORDER — ACETAMINOPHEN 650 MG RE SUPP
650.0000 mg | Freq: Four times a day (QID) | RECTAL | Status: DC | PRN
Start: 2014-06-01 — End: 2014-06-04

## 2014-06-01 NOTE — Progress Notes (Addendum)
ANTIBIOTIC CONSULT NOTE - INITIAL  Pharmacy Consult for Vancomycin / Cefepime Indication: Wound infection / Cellulitis  Allergies  Allergen Reactions  . Compazine [Prochlorperazine Edisylate] Other (See Comments)  . Avelox [Moxifloxacin Hcl In Nacl]   . Levaquin [Levofloxacin In D5w]     Patient Measurements:   Adjusted Body Weight:   Vital Signs: Temp: 98 F (36.7 C) (03/09 0814) Temp Source: Oral (03/09 0814) BP: 140/70 mmHg (03/09 1713) Pulse Rate: 75 (03/09 1713) Intake/Output from previous day:   Intake/Output from this shift:    Labs:  Recent Labs  05/30/14 0305  WBC 8.9  HGB 12.2  PLT 210  CREATININE 0.70   Estimated Creatinine Clearance: 87.3 mL/min (by C-G formula based on Cr of 0.7). No results for input(s): VANCOTROUGH, VANCOPEAK, VANCORANDOM, GENTTROUGH, GENTPEAK, GENTRANDOM, TOBRATROUGH, TOBRAPEAK, TOBRARND, AMIKACINPEAK, AMIKACINTROU, AMIKACIN in the last 72 hours.   Microbiology: Recent Results (from the past 720 hour(s))  MRSA PCR Screening     Status: None   Collection Time: 05/23/14  4:58 AM  Result Value Ref Range Status   MRSA by PCR NEGATIVE NEGATIVE Final    Comment:        The GeneXpert MRSA Assay (FDA approved for NASAL specimens only), is one component of a comprehensive MRSA colonization surveillance program. It is not intended to diagnose MRSA infection nor to guide or monitor treatment for MRSA infections.   Anaerobic culture     Status: None   Collection Time: 05/25/14  2:50 PM  Result Value Ref Range Status   Specimen Description WOUND HAND RIGHT  Final   Special Requests NONE  Final   Gram Stain   Final    NO SQUAMOUS EPITHELIAL CELLS SEEN NO ORGANISMS SEEN DEGENERATED CELLULAR MATERIAL PRESENT Performed at [facility] Performed at [facility]    Culture   Final    NO ANAEROBES ISOLATED Performed at [facility]    Report Status 05/30/2014 FINAL  Final  Gram stain     Status: None   Collection Time: 05/25/14  2:50 PM  Result Value Ref Range Status   Specimen Description WOUND HAND RIGHT  Final   Special Requests NONE  Final   Gram Stain   Final    DEGENERATED CELLULAR MATERIAL PRESENT NO ORGANISMS SEEN    Report Status 05/25/2014 FINAL  Final  Wound culture     Status: None   Collection Time: 05/25/14  2:50 PM  Result Value Ref Range Status   Specimen Description WOUND HAND RIGHT  Final   Special Requests NONE  Final   Gram Stain   Final    NO WBC SEEN NO SQUAMOUS EPITHELIAL CELLS SEEN NO ORGANISMS SEEN DEGENERATED CELLULAR MATERIAL PRESENT Performed at [facility] Performed at [facility]    Culture   Final    MODERATE STAPHYLOCOCCUS AUREUS Note: RIFAMPIN AND GENTAMICIN SHOULD NOT BE USED AS SINGLE DRUGS FOR TREATMENT OF STAPH INFECTIONS. Performed at [facility]    Report Status 05/28/2014 FINAL  Final   Organism ID, Bacteria STAPHYLOCOCCUS AUREUS  Final      Susceptibility   Staphylococcus aureus - MIC*    CLINDAMYCIN <=0.25 SENSITIVE Sensitive     ERYTHROMYCIN <=0.25 SENSITIVE Sensitive     GENTAMICIN <=0.5 SENSITIVE Sensitive     LEVOFLOXACIN 0.25 SENSITIVE Sensitive     OXACILLIN <=0.25 SENSITIVE Sensitive     PENICILLIN >=0.5 RESISTANT Resistant     RIFAMPIN <=0.5 SENSITIVE Sensitive  TRIMETH/SULFA <=10 SENSITIVE Sensitive     VANCOMYCIN <=0.5 SENSITIVE Sensitive     TETRACYCLINE <=1 SENSITIVE Sensitive     MOXIFLOXACIN <=0.25 SENSITIVE Sensitive     * MODERATE STAPHYLOCOCCUS AUREUS  AFB culture with smear     Status: None (Preliminary result)   Collection Time: 05/25/14  2:50 PM  Result Value Ref Range Status   Specimen Description WOUND HAND RIGHT  Final   Special Requests NONE  Final   Acid Fast Smear   Final    NO ACID FAST BACILLI SEEN Performed at [facility]    Culture   Final    CULTURE WILL BE EXAMINED FOR 6 WEEKS BEFORE ISSUING A FINAL REPORT Performed at [facility]    Report  Status PENDING  Incomplete    Medical History: Past Medical History  Diagnosis Date  . CAD (coronary artery disease)     a. s/p LHC on 05/2014 admission with non obst dz. Rx medically.   . Insulin dependent diabetes mellitus   . Noncompliance   . Obesity (BMI 30-39.9)   . Hyperlipidemia   . Hypertension   . Cocaine abuse   . Major depressive disorder   . Suicidal ideation   . Abscess of right hand     a. s/p ID on 05/2014 admission     Assessment: [age] yoF with morbid obesity, T2DM, chronic pain, CAD, HTN, COPD among other medical problems presents with worsening cellulitis and abscess of the right hand s/p I/D on 3/2 with cultures growing MSSA and treated with doxycycline.  Pharmacy consulted to start IV vancomycin and cefepime.  Pt may need further ortho consult tomorrow.    3/9 >> Vancomycin  >> 3/9 >> Cefepime >>    Tmax: 98.2 WBCs: WNL Renal: SCr 0.70, CrCl 87 CG (N >100)  / blood: / urine:  / sputum:   Goal of Therapy:  Vancomycin trough level 15-20 mcg/ml  Plan:  Cefepime 1g IV q8h Vancomycin 2g x 1 now, then 1250g IV q12h F/u renal function, clinical course, VT as warranted  Haynes Hoehnolleen Jalisa Sacco, PharmD, BCPS 06/01/2014, 8:16 PM  Pager: [phone]

## 2014-06-01 NOTE — H&P (Signed)
Patient Demographics  Shirley Short, is a 54 y.o. female  MRN: [id number]   DOB - [birth year]  Admit Date - 06/01/2014  Outpatient Primary MD for the patient is No PCP Per Patient   Assessment Shirley Short is a pleasant 54 year old female with multiple medical problems including essential hypertension/hyperlipidemia/CAD being medically treated/COPD/tobacco dependency/morbid obesity BMI 30-39.9/diabetes mellitus type2, chronic pain disorder, among other medical problems who was seen at [facility](where she was admitted for Major Depression with suicidal ideation), after consultation for worsening cellulitis of the right hand s/p incision and drainage a few days ago by Dr Mina Marble with wound cultures growing staph aureus. She was noted to have worsening swelling of the hand with increasing pain. She had been placed on Doxycycline after incision and drainage at [facility] where she was admitted to the cardiology service as transfer from [facility] for suspected acute myocardial infarction in setting of active cocaine use, LBBB on EKG. She had cardiac catheterization with finding of "...coronary vasospasm and distal LAD and RCA disease. Medical therapy was recommended...'. Apparently, cellulitis has worsened since transfer therefore reason for consultation. At this point it appears that patient needs IV antibiotics and may need reevaluation by orthopedics/wound care. She has therefore been transferred to [facility] where she can get IV antibiotics and comprehensive workup. Will obtain CBC/CMP/UA/urine culture/chest x-ray, start IV fluids, and start vancomycin/cefepime and reassess depending on preliminary results. Patient should continue wound care as instructed by orthopedics. She will be followed by psychiatry for management of major depression. She should remain on one-on-one observation until deemed no longer necessary by psychiatry. Will hold metformin and place her on Lantus/SSI. Plan Abscess and  cellulitis of right hand  Admit MedSurg  CBC/CMP/UA/urine culture/blood culture/chest x-ray  IV fluids/vancomycin/cefepime per pharmacy  Wound care consultation, consider orthopedics consultation depending on how she does with initiation of IV antibiotics. Cocaine abuse/Chronic pain disorder/MDD (major depressive disorder), recurrent, severe, with psychosis/Suicidal ideation  Defer management to psychiatry  Bedside sitter  Pain meds as necessary DM2 (diabetes mellitus, type 2)  Check hemoglobin A1c  Lantus/SSI Obesity (BMI 30-39.9)/COPD (chronic obstructive pulmonary disease)/Tobacco abuse/Hypertension/Hyperlipidemia/CAD (coronary artery disease)  No acute changes  Resume home meds DVT/GI Prophylaxis  Lovenox/PPI  Chief Complaint Worsening right hand pain and swelling   HPI Shirley Short  is a 54 y.o. female who had incision and drainage done recently and reports increasing swelling and pain of the right hand despite doxycycline. Wound culture grew staph aureus which is pansensitive. She also reports nausea. Denies cough or shortness of breath or dysuria or fever. Psychiatry apparently called orthopedics with recommendations to continue wound dressings.  Family Communication: Admission, patients condition and plan of care including tests being ordered have been discussed with the patient who indicates understanding and agree with the plan and Code Status.  Code Status   Full  Likely DC  [facility].  Condition GUARDED    Time spent in minutes : 55  Review of Systems   As in the HPI above,   Past Medical History  Diagnosis Date  . CAD (coronary artery disease)     a. s/p LHC on 05/2014 admission with non obst dz. Rx medically.   . Insulin dependent diabetes mellitus   . Noncompliance   . Obesity (BMI 30-39.9)   . Hyperlipidemia   . Hypertension   . Cocaine abuse   . Major depressive disorder   . Suicidal ideation   . Abscess of right hand  a. s/p ID on 05/2014 admission       Past Surgical History  Procedure Laterality Date  . Appendectomy    . Abdominal hysterectomy    . Cholecystectomy    . Left heart catheterization with coronary angiogram N/A 05/23/2014    Procedure: LEFT HEART CATHETERIZATION WITH CORONARY ANGIOGRAM;  Surgeon: Runell Gess, MD;  Location: [facility] CATH LAB;  Service: Cardiovascular;  Laterality: N/A;  . I&d extremity Right 05/25/2014    Procedure: IRRIGATION AND DEBRIDEMENT EXTREMITY;  Surgeon: Dairl Ponder, MD;  Location: [facility] OR;  Service: Orthopedics;  Laterality: Right;    Social History History  Substance Use Topics  . Smoking status: Current Every Day Smoker -- 1.50 packs/day for 30 years    Types: Cigarettes  . Smokeless tobacco: Never Used  . Alcohol Use: No    Family History No family history on file.  Prior to Admission medications   Medication Sig Start Date End Date Taking? Authorizing Provider  amitriptyline (ELAVIL) 100 MG tablet Take 1 tablet by mouth at bedtime. 05/10/14   Historical Provider, MD  amLODipine (NORVASC) 5 MG tablet Take 1 tablet (5 mg total) by mouth daily. 05/31/14   Janetta Hora, PA-C  aspirin EC 81 MG EC tablet Take 1 tablet (81 mg total) by mouth daily. 05/31/14   Janetta Hora, PA-C  atorvastatin (LIPITOR) 80 MG tablet Take 1 tablet (80 mg total) by mouth daily at 6 PM. 05/31/14   Janetta Hora, PA-C  doxycycline (VIBRA-TABS) 100 MG tablet Take 1 tablet (100 mg total) by mouth every 12 (twelve) hours. 05/31/14   Janetta Hora, PA-C  gabapentin (NEURONTIN) 300 MG capsule Take 2 capsules (600 mg total) by mouth 3 (three) times daily. 05/31/14   Janetta Hora, PA-C  isosorbide mononitrate (IMDUR) 30 MG 24 hr tablet Take 1 tablet (30 mg total) by mouth daily. 05/31/14   Janetta Hora, PA-C  lansoprazole (PREVACID) 30 MG capsule Take 30 mg by mouth daily. 05/10/14   Historical Provider, MD  lisinopril (PRINIVIL,ZESTRIL) 40 MG tablet Take 1 tablet (40  mg total) by mouth daily. 05/31/14   Janetta Hora, PA-C  LYRICA 100 MG capsule Take 1 capsule by mouth every 8 (eight) hours as needed. 05/10/14   Historical Provider, MD  metFORMIN (GLUCOPHAGE) 500 MG tablet Take 500 mg by mouth every 12 (twelve) hours. 05/10/14   Historical Provider, MD  nicotine (NICODERM CQ - DOSED IN MG/24 HOURS) 21 mg/24hr patch Place 1 patch (21 mg total) onto the skin daily. 05/31/14   Janetta Hora, PA-C  VYVANSE 40 MG capsule Take 1 tablet by mouth every morning. 05/07/14   Historical Provider, MD    Allergies  Allergen Reactions  . Compazine [Prochlorperazine Edisylate] Other (See Comments)  . Avelox [Moxifloxacin Hcl In Nacl]   . Levaquin [Levofloxacin In D5w]     Physical Exam  Vitals  There were no vitals taken for this visit.   1. General: lying in bed in NAD.  2. Normal affect and insight, Not Suicidal or Homicidal, Awake Alert, Oriented X 3.  3. No F.N deficits, ALL C.Nerves Intact, Strength 5/5 all 4 extremities, Sensation intact all 4 extremities, Plantars down going.  4. Ears and Eyes appear Normal, Conjunctivae clear, PERRLA. Moist Oral Mucosa.  5. Supple Neck, No JVD, No cervical lymphadenopathy appriciated, No Carotid Bruits.  6. Symmetrical Chest wall movement, Good air movement bilaterally, CTAB.  7. RRR, No Gallops, Rubs or Murmurs, No Parasternal  Heave.  8. Positive Bowel Sounds, Abdomen Soft, Non tender, No organomegaly appriciated,No rebound -guarding or rigidity.  9.  No Cyanosis, Normal Skin Turgor, No Skin Rash or Bruise.  10. Swelling and erythema of the right hand with some drainage from wound. Tenderness to passive movement.  11. No Palpable Lymph Nodes in Neck or Axillae  Data Review  CBC  Recent Labs Lab 05/26/14 0448 05/28/14 0343 05/30/14 0305  WBC 9.3 8.0 8.9  HGB 11.4* 11.7* 12.2  HCT 34.6* 36.1 37.7  PLT 107* 159 210  MCV 82.4 83.4 83.2  MCH 27.1 27.0 26.9  MCHC 32.9 32.4 32.4  RDW 13.7 13.7 13.6   LYMPHSABS 1.9  --   --   MONOABS 0.5  --   --   EOSABS 0.2  --   --   BASOSABS 0.0  --   --    ------------------------------------------------------------------------------------------------------------------  Chemistries   Recent Labs Lab 05/26/14 0448 05/28/14 0343 05/29/14 0548 05/30/14 0305  NA 133* 137 136 137  K 4.4 4.5 4.7 4.8  CL 98 97 101 98  CO2 27 32 31 31  GLUCOSE 207* 220* 152* 182*  BUN 8 10 8 11   CREATININE 0.70 0.76 0.59 0.70  CALCIUM 8.7 9.3 9.2 9.6   ------------------------------------------------------------------------------------------------------------------ estimated creatinine clearance is 87.3 mL/min (by C-G formula based on Cr of 0.7). ------------------------------------------------------------------------------------------------------------------ No results for input(s): TSH, T4TOTAL, T3FREE, THYROIDAB in the last 72 hours.  Invalid input(s): FREET3   Coagulation profile No results for input(s): INR, PROTIME in the last 168 hours. ------------------------------------------------------------------------------------------------------------------- No results for input(s): DDIMER in the last 72 hours. -------------------------------------------------------------------------------------------------------------------  Cardiac Enzymes No results for input(s): CKMB, TROPONINI, MYOGLOBIN in the last 168 hours.  Invalid input(s): CK ------------------------------------------------------------------------------------------------------------------ Invalid input(s): POCBNP   ---------------------------------------------------------------------------------------------------------------  Urinalysis    Component Value Date/Time   COLORURINE YELLOW 12/19/2008 1735   APPEARANCEUR CLEAR 12/19/2008 1735   LABSPEC 1.031* 12/19/2008 1735   PHURINE 5.0 12/19/2008 1735   GLUCOSEU NEGATIVE 12/19/2008 1735   HGBUR NEGATIVE 12/19/2008 1735   BILIRUBINUR  NEGATIVE 12/19/2008 1735   KETONESUR NEGATIVE 12/19/2008 1735   PROTEINUR NEGATIVE 12/19/2008 1735   UROBILINOGEN 0.2 12/19/2008 1735   NITRITE NEGATIVE 12/19/2008 1735   LEUKOCYTESUR SMALL* 12/19/2008 1735    ----------------------------------------------------------------------------------------------------------------  Imaging results:   No results found.      Janele Lague M.D on 06/01/2014 at 7:39 PM  Between 7am to 7pm - Pager - [phone]  After 7pm go to [email or web address] - password [email or web address]  And look for the night coverage person covering me after hours  [facility] Office  [phone]

## 2014-06-01 NOTE — Consult Note (Signed)
WOC consulted for hand wound.  I have reviewed the chart and discussed surgical wound with Dr. Ronie SpiesWeingold's office who operated on this patient 05/26/14. New wound care orders [facility] along with [facility] #s so that they may obtain the supplies from materials.  Change will be daily.  Follow up with Dr. Mina MarbleWeingold as per hospital DC instructions.    Re consult if needed, will not follow at this time. Thanks  Srihari Shellhammer [employer] RN, CWOCN [phone])

## 2014-06-01 NOTE — Telephone Encounter (Addendum)
I called the phone number listed in the pts chart and spoke with the pts step father who stated that the pt does not live there and he does not know how to reach her. He said that if he sees her he will ask her to call this office. Also, I called the pts emergency contact, Ronalee Redichard Meadows, but got no answer and could not leave a message as his VM is full.

## 2014-06-01 NOTE — Progress Notes (Signed)
Patient ID: Shirley SimmondsDonna L Dayley, female   DOB: 11/09/1960, 54 y.o.   MRN: [id number]  Pt reports increased tenderness and swelling. Hand assessed and non pitting edema noted in the distal phalanges. Hospitalist, Ranga MD, assessed patient and the wound. Post assessment pt states, "I need to go to the bathroom." Pt did not let writer re-wrap her dressing until after her bathroom trip stating, "oh, yeah, after all the bathroom problems I had, I'm going to wait for you to change it." MD, provider, [facility], charge nurse and writer all notified of pt transfer per hospitalist orders. Pt wound rewrapped, pt refused dressing change stating, I'm going to wait until I get to the hospital, they can give me numbing stuff and more pain medications." Will continue to monitor.
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
## 2014-06-01 NOTE — Progress Notes (Signed)
Patient ID: Shirley Short, female   DOB: 03/09/1961, 54 y.o.   MRN: [id number]  Pt discharged via [place] to [facility], room 1513. Report given to CoburgEstelle, Charity fundraiserN. Pt given 1700 medications and meal tray. Pt [facility] Kenney Housemananya, [facility]. Pt stable and appreciatve before transfer. Pt given all belongings.

## 2014-06-01 NOTE — [facility] Group Notes (Signed)
[facility]  06/01/2014 , 1:41 PM    Type of Therapy:  [facility] [employer] Presentation  Participation Level:  Invited.  Chose to not attend  Summary of Progress/Problems:  Onalee HuaDavid from [employer] came to present his recovery story and play the guitar.    Daryel Geraldorth, Zailen Albarran B 06/01/2014 , 1:41 PM

## 2014-06-01 NOTE — Progress Notes (Signed)
Patient wakes up to use the rest room but then goes right back to sleep.  Patient has been snoring and has been ecouraged all night to elevate her hand.  Patient states she cannot sleep that way.  Patient has been sleeping on her right hand instead and was instructed by writer not to but patient states she cannot help it.

## 2014-06-01 NOTE — Progress Notes (Signed)
Patient has voided several times tonight and in incontinent of urine.  Patient will get up but not make it to the toilet.  Patient was told to call staff if she needs assistance.  Staff has had to clean urine off the floor 5 times tonight since 11pm.  Patient not willing to use adult diaper or pads.

## 2014-06-01 NOTE — Progress Notes (Signed)
Patient ID: Shirley SimmondsDonna L Short, female   DOB: 06/20/1960, 54 y.o.   MRN: [id number]  Pt called writer to room. Pt stated "I am going to start tearing s**t up in here If I don't get anything for this pain. Pt reports increased swelling in her distal fingertips on her R hand. Pt offered cool packs for swelling and encouraged to elevate the extremity. Pt agitated. One time percocet ordered for pt per MD orders. MD confirmation that doctor from hospital doctor will come to assess patient ASAP. Will continue to monitor.

## 2014-06-01 NOTE — Progress Notes (Signed)
Patient ID: Shirley SimmondsDonna L Klinke, female   DOB: 07/25/1960, 54 y.o.   MRN: [id number]  Pt currently presents with a flat affect and irritable behavior. Pt did not take her morning medications stating, "i don't want to take anything until I take pain medication. I feel like everyone is just trying to appease me." Pt states "i only ate a couple bites of egg this morning. I'm feeling nauseas." Pt encouraged to take medications and explanation of HTN/hyperglycemia was given but reinforcement needed.   Pt provided with medications per providers orders. Pt's labs and vitals were monitored throughout the day. Pt supported emotionally and encouraged to express concerns and questions.   Provider consulted about new wound care orders. Pain medication pre-wound care to be discussed with physician.

## 2014-06-01 NOTE — Progress Notes (Signed)
Earlier, right hand examined.  Red, warm and edematous.  The area is tender.  TRH consult.  Consult completed and [facility].

## 2014-06-01 NOTE — Tx Team (Signed)
  Interdisciplinary Treatment Plan Update   Date Reviewed:  06/01/2014  Time Reviewed:  8:26 AM  Progress in Treatment:   Attending groups: No Participating in groups: No Taking medication as prescribed: Yes  Tolerating medication: Yes Family/Significant other contact made: Yes  Patient understands diagnosis: Yes AEB asking for help with pain Discussing patient identified problems/goals with staff: Yes  See initial care plan Medical problems stabilized or resolved: Yes Denies suicidal/homicidal ideation: Yes  In tx team Patient has not harmed self or others: Yes  For review of initial/current patient goals, please see plan of care.  Estimated Length of Stay:  4-5 days  Reason for Continuation of Hospitalization: Depression Medical Issues Medication stabilization  New Problems/Goals identified:  N/A  Discharge Plan or Barriers:   return home, follow up outpt  Additional Comments:  Chaplain was on the unit and one of the nurses said the patient wanted to talk to someone. Chaplain spend roughly 30 min visiting the patient. Patient primarily wanted to talk about her drug addiction. Patient explained that she had been a cocaine user off and on for many years. Patient explained her ailments that have hospitalized her can be attributed to her use of cocaine. Patient primarily wanted to talk about how she wishes to change and stop using cocaine. Chaplain facilitated a conversation about what substance abuse recovery looks like for the patient. Patient primarily cited her acquaintances/friends as instigators of her habit. Patient said she does not have any other relationships or community outside of other cocaine user. Patient said she is estranged from many of her family members.  Does not want rehab referral.  [place].  Cymbalta, Neurontin trial  Attendees:  Signature: Ivin BootySarama Eappen, MD 06/01/2014 8:26 AM   Signature: Richelle Itood Kirsti Mcalpine, LCSW 06/01/2014 8:26 AM  Signature: 06/01/2014  8:26 AM  Signature: Kathi SimpersSarah Twyman, RN 06/01/2014 8:26 AM  Signature:  06/01/2014 8:26 AM  Signature:  06/01/2014 8:26 AM  Signature:   06/01/2014 8:26 AM  Signature:    Signature:    Signature:    Signature:    Signature:    Signature:      Scribe for Treatment Team:   Richelle Itood Jayquan Bradsher, LCSW  06/01/2014 8:26 AM

## 2014-06-01 NOTE — [facility] Suicide Risk Assessment (Signed)
[facility] Admission Suicide Risk Assessment   Nursing information obtained from:  Patient Demographic factors:  Caucasian, Low socioeconomic status Current Mental Status:  Suicidal ideation indicated by patient, Self-harm behaviors Loss Factors:  Decline in physical health Historical Factors:  Prior suicide attempts Risk Reduction Factors:  Positive social support Total Time spent with patient: 45 minutes Principal Problem: MDD, Cocaine Dependence  Diagnosis:   Patient Active Problem List   Diagnosis Date Noted  . Chronic pain disorder [G89.4] 05/31/2014  . MDD (major depressive disorder) [F32.2] 05/31/2014  . Suicidal ideation [R45.851]   . Abscess of right hand [L02.511]   . CAD (coronary artery disease) [I25.10]   . MDD (major depressive disorder), recurrent, severe, with psychosis [F33.3] 05/29/2014  . Cocaine abuse [F14.10] 05/23/2014  . CAD (coronary artery disease), native coronary artery [I25.10] 05/23/2014  . Obesity (BMI 30-39.9) [E66.9] 05/23/2014  . COPD (chronic obstructive pulmonary disease) [J44.9] 05/23/2014  . Hyperlipidemia [E78.5] 05/23/2014  . Insulin dependent diabetes mellitus [E11.9, Z79.4]   . Noncompliance [Z91.19]   . Tobacco abuse [Z72.0]   . Hypertension [I10]      Continued Clinical Symptoms:  Alcohol Use Disorder Identification Test Final Score (AUDIT): 0 The "Alcohol Use Disorders Identification Test", Guidelines for Use in Primary Care, Second Edition.  World Science writer [facility]). Score between 0-7:  no or low risk or alcohol related problems. Score between 8-15:  moderate risk of alcohol related problems. Score between 16-19:  high risk of alcohol related problems. Score 20 or above:  warrants further diagnostic evaluation for alcohol dependence and treatment.   CLINICAL FACTORS:   54 year old female, history of cocaine dependence, status post medical admission and work up for acute chest pain, which was worked up. Reported depression and SI  during medical admission. At this time denying any SI, but does report depression and presents dysphoric and irritable. Focused on hand open /debrided wound ( nosocomial infection on IV site) and intense pain on site.    Musculoskeletal: Strength & Muscle Tone: within normal limits Gait & Station: normal Patient leans: N/A  Psychiatric Specialty Exam: Physical Exam  ROS  Blood pressure 142/63, pulse 87, temperature 98 F (36.7 C), temperature source Oral, resp. rate 18, height  (1.626 m), weight 194 lb (87.998 kg), SpO2 96 %.Body mass index is 33.28 kg/(m^2).  See admit note MSE                                                        COGNITIVE FEATURES THAT CONTRIBUTE TO RISK:  Closed-mindedness and Polarized thinking    SUICIDE RISK:   Moderate:  Frequent suicidal ideation with limited intensity, and duration, some specificity in terms of plans, no associated intent, good self-control, limited dysphoria/symptomatology, some risk factors present, and identifiable protective factors, including available and accessible social support.  PLAN OF CARE: Patient will be admitted to inpatient psychiatric unit for stabilization and safety. Will provide and encourage milieu participation. Provide medication management and maked adjustments as needed.  Will follow daily.    Medical Decision Making:  Review of Psycho-Social Stressors (1), Review or order clinical lab tests (1), Established Problem, Worsening (2), Review of Medication Regimen & Side Effects (2) and Review of New Medication or Change in Dosage (2)  I certify that inpatient services furnished can reasonably be  expected to improve the patient's condition.   COBOS, FERNANDO 06/01/2014, 3:06 PM

## 2014-06-02 DIAGNOSIS — F1910 Other psychoactive substance abuse, uncomplicated: Secondary | ICD-10-CM

## 2014-06-02 DIAGNOSIS — F333 Major depressive disorder, recurrent, severe with psychotic symptoms: Secondary | ICD-10-CM

## 2014-06-02 LAB — TSH: TSH: 3.835 u[IU]/mL (ref 0.350–4.500)

## 2014-06-02 LAB — COMPREHENSIVE METABOLIC PANEL
ALT: 25 U/L (ref 0–35)
ANION GAP: 8 (ref 5–15)
AST: 39 U/L — AB (ref 0–37)
Albumin: 2.8 g/dL — ABNORMAL LOW (ref 3.5–5.2)
Alkaline Phosphatase: 112 U/L (ref 39–117)
BILIRUBIN TOTAL: 0.2 mg/dL — AB (ref 0.3–1.2)
BUN: 11 mg/dL (ref 6–23)
CHLORIDE: 107 mmol/L (ref 96–112)
CO2: 22 mmol/L (ref 19–32)
Calcium: 8.8 mg/dL (ref 8.4–10.5)
Creatinine, Ser: 0.74 mg/dL (ref 0.50–1.10)
GFR calc Af Amer: >90 mL/min (ref >90)
GFR calc non Af Amer: >90 mL/min (ref >90)
Glucose, Bld: 151 mg/dL — ABNORMAL HIGH (ref 70–99)
Potassium: 4.6 mmol/L (ref 3.5–5.1)
SODIUM: 137 mmol/L (ref 135–145)
TOTAL PROTEIN: 6.1 g/dL (ref 6.0–8.3)

## 2014-06-02 LAB — URINALYSIS, ROUTINE W REFLEX MICROSCOPIC
Bilirubin Urine: NEGATIVE
GLUCOSE, UA: NEGATIVE mg/dL
Hgb urine dipstick: NEGATIVE
KETONES UR: NEGATIVE mg/dL
Leukocytes, UA: NEGATIVE
Nitrite: NEGATIVE
Protein, ur: NEGATIVE mg/dL
Specific Gravity, Urine: 1.013 (ref 1.005–1.030)
Urobilinogen, UA: 0.2 mg/dL (ref 0.0–1.0)
pH: 5.5 (ref 5.0–8.0)

## 2014-06-02 LAB — GLUCOSE, CAPILLARY
GLUCOSE-CAPILLARY: 230 mg/dL — AB (ref 70–99)
Glucose-Capillary: 154 mg/dL — ABNORMAL HIGH (ref 70–99)
Glucose-Capillary: 243 mg/dL — ABNORMAL HIGH (ref 70–99)
Glucose-Capillary: 222 mg/dL — ABNORMAL HIGH (ref 70–99)

## 2014-06-02 LAB — CBC
HEMATOCRIT: 36.5 % (ref 36.0–46.0)
Hemoglobin: 11.6 g/dL — ABNORMAL LOW (ref 12.0–15.0)
MCH: 26.9 pg (ref 26.0–34.0)
MCHC: 31.8 g/dL (ref 30.0–36.0)
MCV: 84.7 fL (ref 78.0–100.0)
Platelets: 230 10*3/uL (ref 150–400)
RBC: 4.31 MIL/uL (ref 3.87–5.11)
RDW: 13.6 % (ref 11.5–15.5)
WBC: 8.4 10*3/uL (ref 4.0–10.5)

## 2014-06-02 MED ORDER — MORPHINE SULFATE 4 MG/ML IJ SOLN
4.0000 mg | INTRAMUSCULAR | Status: DC | PRN
  Administered 2014-06-02 – 2014-06-04 (×12): 4 mg via INTRAVENOUS
  Filled 2014-06-02 (×13): qty 1

## 2014-06-02 MED ORDER — DIPHENHYDRAMINE HCL 50 MG/ML IJ SOLN
25.0000 mg | Freq: Once | INTRAMUSCULAR | Status: AC
  Administered 2014-06-02: 25 mg via INTRAVENOUS
  Filled 2014-06-02: qty 1

## 2014-06-02 MED ORDER — INSULIN DETEMIR 100 UNIT/ML ~~LOC~~ SOLN
14.0000 [IU] | Freq: Every day | SUBCUTANEOUS | Status: DC
  Administered 2014-06-02 – 2014-06-03 (×2): 14 [IU] via SUBCUTANEOUS
  Filled 2014-06-02 (×2): qty 0.14

## 2014-06-02 MED ORDER — SODIUM CHLORIDE 0.9 % IJ SOLN
10.0000 mL | INTRAMUSCULAR | Status: DC | PRN
  Administered 2014-06-03: 10 mL
  Filled 2014-06-02: qty 40

## 2014-06-02 MED ORDER — DIPHENHYDRAMINE HCL 25 MG PO CAPS
25.0000 mg | ORAL_CAPSULE | Freq: Once | ORAL | Status: AC
  Administered 2014-06-02: 25 mg via ORAL
  Filled 2014-06-02: qty 1

## 2014-06-02 NOTE — Consult Note (Signed)
WOC wound consult note Reason for Consult: S/P I & D right dorsal hand.  Orders for wound care consult.  Wound type:Infectious/cellulitis/surgical debridement Measurement: 4 cm x 1 cm x 0.4 cm  Wound opening undermines 0.2 cm circumferentially.  Wound bed: 100% pale pink wound bed Drainage (amount, consistency, odor) Moderate, serosanguinous drainage.  No odor.  Periwound: Edematous and painful.  Minimal erythema noted circumferentially.  Dressing procedure/placement/frequency: Cleanse wound right dorsal hand.  Gently fill wound bed with Iodoform packing strip.  Cover with 4x4 gauze and secure with kerlix and tape. Change daily.  Bedside nurse may perform.  Will not follow at this time.  Please re-consult if needed.  Maple HudsonKaren Trixy Loyola RN BSN CWON Pager [phone]

## 2014-06-02 NOTE — Discharge Summary (Signed)
  Patient Identification: Conley SimmondsDonna L Dault MRN: [id number] Date of Evaluation: 06/01/2014 Chief Complaint: " I have a lot of pain on my hand" Principal Diagnosis: Depression, Cocaine Dependence, Opiate Abuse by History Diagnosis:  Patient Active Problem List   Diagnosis Date Noted  . Chronic pain disorder [G89.4] 05/31/2014  . MDD (major depressive disorder) [F32.2] 05/31/2014  . Suicidal ideation [R45.851]   . Abscess of right hand [L02.511]   . CAD (coronary artery disease) [I25.10]   . MDD (major depressive disorder), recurrent, severe, with psychosis [F33.3] 05/29/2014  . Cocaine abuse [F14.10] 05/23/2014  . CAD (coronary artery disease), native coronary artery [I25.10] 05/23/2014  . Obesity (BMI 30-39.9) [E66.9] 05/23/2014  . COPD (chronic obstructive pulmonary disease) [J44.9] 05/23/2014  . Hyperlipidemia [E78.5] 05/23/2014  . Insulin dependent diabetes mellitus [E11.9, Z79.4]   . Noncompliance [Z91.19]   . Tobacco abuse [Z72.0]   . Hypertension [I10]    History of Present Illness:: Patient is a 54 year old female, who presented to hospital With chest pain, occuring after a period of regular cocaine use. Was worked up with cardiac catheterization and found to have coronary artery disease and vasospasm. She was treated medically.During her medical admission reported depression and suicidal ideation. Of note, patient developed a nosocomial hand infection on IV site, which required debridement. At this time her major focus is on this issue, and describes significant pain and discomfort. She allowed us to examine wound, which is open/ packed with drainage. Patient reports she is depressed, but denies any suicidal ideations at present. She also denies any hallucinations. She does not appear to Be internally preoccupied.  She does report some symptoms of depression as noted below.      Per Dr Jama Flavorsobos Plan: Observation  Level/Precautions: 15 minute checks  Laboratory: Will repeat CBC to monitor WBC , differential . Repeat U/A, UCx , due to urgency  Psychotherapy: Milieu, support   Medications: Neurontin, restart Cymbalta 30 mgrs QAM, D/C Toradol, which she states is not currently helping. She warrants time limited opiate management to address severe hand pain. Will start Percocet PRNS. Patient is on Doxycycline for Infection. Will decrease Amitryptiline dose to minimize potential side effects, drug -drug interactions.  Consultations: Will request Hospitalist Consult to determine if hand wound may need re-debridement and /or IV antibiotic management  Discharge Concerns: Homelessness, poor support network  Estimated LOS: 6 Days   Other:        Patient was seen this am and right hand wound clearly edematous, red, warm.  Patient refused for any dressing to be done.  Currently packed with Iodaform packing gauze strip.  States that the pain is too unbearable that she would rather cut her hand off.  She is currently on Doxycycline po regimen.  Consult was requested for possible further debridment and IV antibiotics.  Consult verified [facility].  Patient admitted [id number].  Signed: Adonis Brookgustin, Sheila May AGNP-BC  As above, patient evaluated by Hospitalist and [facility] medical management. Have added patient to [facility] psychiatry list to continue [facility]

## 2014-06-02 NOTE — Consult Note (Signed)
[facility] Face-to-Face Psychiatry Consult   Reason for Consult:  Depression and substance abuse Referring Physician:  Dr. Venetia Constable Patient Identification: Shirley Short MRN:  [id number] Principal Diagnosis: MDD (major depressive disorder), recurrent, severe, with psychosis Diagnosis:   Patient Active Problem List   Diagnosis Date Noted  . Abscess and cellulitis [L03.90, L02.91] 06/01/2014  . Chronic pain disorder [G89.4] 05/31/2014  . MDD (major depressive disorder) [F32.2] 05/31/2014  . Suicidal ideation [R45.851]   . Abscess of right hand [L02.511]   . CAD (coronary artery disease) [I25.10]   . MDD (major depressive disorder), recurrent, severe, with psychosis [F33.3] 05/29/2014  . Cocaine abuse [F14.10] 05/23/2014  . CAD (coronary artery disease), native coronary artery [I25.10] 05/23/2014  . Obesity (BMI 30-39.9) [E66.9] 05/23/2014  . COPD (chronic obstructive pulmonary disease) [J44.9] 05/23/2014  . Hyperlipidemia [E78.5] 05/23/2014  . DM2 (diabetes mellitus, type 2) [E11.9]   . Noncompliance [Z91.19]   . Tobacco abuse [Z72.0]   . Hypertension [I10]     Total Time spent with patient: 45 minutes  Subjective:   Shirley Short is a 54 y.o. female patient admitted with depression and substance abuse.  HPI: Shirley Short is a 54 year old female seen and chart reviewed for psychiatric consultation and evaluation of depression, suicidal ideation and history of substance abuse. Patient was known to this provider from her previous encounter at [facility]. Patient was initially presented at [facility] in [place] with chest pain and then transferred to [facility]. Patient complained about depression and suicidal ideation and then referred to the behavioral health Hospital. Patient was admitted to [facility] secondary to increased infection of the hand and uncontrollable pain. Patient currently denies symptoms of depression, anxiety, mania, psychosis and  denies current suicidal, homicidal ideation, intention or plans. Patient reported she has a grandchild in [place], [place] and planning to go and see her and spend time upon medically cleared. Patient denies previous history of suicidal attempt or family history of suicide. Patient has no craving for drugs or withdrawal symptoms at this time. Patient was previously seen mental health provider at [facility].  Medical history: Patient with multiple medical problems including essential hypertension/hyperlipidemia/CAD being medically treated/COPD/tobacco dependency/morbid obesity BMI 30-39.9/diabetes mellitus type2, chronic pain disorder, among other medical problems who was seen at [facility](where she was admitted for Major Depression with suicidal ideation), after consultation for worsening cellulitis of the right hand s/p incision and drainage a few days ago by Dr Mina Marble with wound cultures growing staph aureus. She was noted to have worsening swelling of the hand with increasing pain. She had been placed on Doxycycline after incision and drainage at [facility] where she was admitted to the cardiology service as transfer from [facility] for suspected acute myocardial infarction in setting of active cocaine use, LBBB on EKG. She had cardiac catheterization with finding of "...coronary vasospasm and distal LAD and RCA disease. Medical therapy was recommended...'. Apparently, cellulitis has worsened since transfer therefore reason for consultation. At this point it appears that patient needs IV antibiotics and may need reevaluation by orthopedics/wound care. She has therefore been transferred to [facility] where she can get IV antibiotics and comprehensive workup. Will obtain CBC/CMP/UA/urine culture/chest x-ray, start IV fluids, and start vancomycin/cefepime and reassess depending on preliminary results. Patient should continue wound care as instructed by orthopedics. She will be  followed by psychiatry for management of major depression. She should remain on one-on-one observation until deemed no longer necessary  by psychiatry. Will hold metformin and place her on Lantus/SSI.  HPI Elements:   Location:  Depression and substance abuse. Quality:  Fair. Severity:  Mild . Timing:  Cardiac event. Duration:  Tenderness. Context:  Unknown stresses.  Past Medical History:  Past Medical History  Diagnosis Date  . CAD (coronary artery disease)     a. s/p LHC on 05/2014 admission with non obst dz. Rx medically.   . Insulin dependent diabetes mellitus   . Noncompliance   . Obesity (BMI 30-39.9)   . Hyperlipidemia   . Hypertension   . Cocaine abuse   . Major depressive disorder   . Suicidal ideation   . Abscess of right hand     a. s/p ID on 05/2014 admission     Past Surgical History  Procedure Laterality Date  . Appendectomy    . Abdominal hysterectomy    . Cholecystectomy    . Left heart catheterization with coronary angiogram N/A 05/23/2014    Procedure: LEFT HEART CATHETERIZATION WITH CORONARY ANGIOGRAM;  Surgeon: Runell Gess, MD;  Location: [facility] CATH LAB;  Service: Cardiovascular;  Laterality: N/A;  . I&d extremity Right 05/25/2014    Procedure: IRRIGATION AND DEBRIDEMENT EXTREMITY;  Surgeon: Dairl Ponder, MD;  Location: [facility] OR;  Service: Orthopedics;  Laterality: Right;   Family History: History reviewed. No pertinent family history. Social History:  History  Alcohol Use No     History  Drug Use  . Yes    Comment: Cocaine    History   Social History  . Marital Status: Divorced    Spouse Name: N/A  . Number of Children: N/A  . Years of Education: N/A   Social History Main Topics  . Smoking status: Current Every Day Smoker -- 1.50 packs/day for 30 years    Types: Cigarettes  . Smokeless tobacco: Never Used  . Alcohol Use: No  . Drug Use: Yes     Comment: Cocaine  . Sexual Activity: Not on file   Other Topics Concern  . None   Social  History Narrative   Additional Social History: Patient stated she lives with her boyfriend and her Letta Kocher.         Allergies:   Allergies  Allergen Reactions  . Compazine [Prochlorperazine Edisylate] Other (See Comments)  . Cefepime Itching and Rash    Rash and itching at IV site  . Avelox [Moxifloxacin Hcl In Nacl]   . Levaquin [Levofloxacin In D5w]     Vitals: Blood pressure 172/72, pulse 78, temperature 98 F (36.7 C), temperature source Oral, resp. rate 20, SpO2 100 %.  Risk to Self: Is patient at risk for suicide?: Yes Risk to Others:   Prior Inpatient Therapy:   Prior Outpatient Therapy:    Current Facility-Administered Medications  Medication Dose Route Frequency Provider Last Rate Last Dose  . 0.9 %  sodium chloride infusion   Intravenous Continuous Simbiso Ranga, MD 50 mL/hr at 06/01/14 2129    . acetaminophen (TYLENOL) tablet 650 mg  650 mg Oral Q6H PRN Simbiso Ranga, MD       Or  . acetaminophen (TYLENOL) suppository 650 mg  650 mg Rectal Q6H PRN Simbiso Ranga, MD      . albuterol (PROVENTIL) (2.5 MG/3ML) 0.083% nebulizer solution 2.5 mg  2.5 mg Nebulization Q2H PRN Simbiso Ranga, MD      . alum & mag hydroxide-simeth (MAALOX/MYLANTA) 200-200-20 MG/5ML suspension 30 mL  30 mL Oral Q6H PRN Conley Canal, MD      .  amitriptyline (ELAVIL) tablet 100 mg  100 mg Oral QHS Simbiso Ranga, MD   100 mg at 06/01/14 2130  . amLODipine (NORVASC) tablet 5 mg  5 mg Oral Daily Simbiso Ranga, MD   5 mg at 06/02/14 0940  . aspirin EC tablet 81 mg  81 mg Oral Daily Simbiso Ranga, MD   81 mg at 06/02/14 0940  . atorvastatin (LIPITOR) tablet 80 mg  80 mg Oral Daily Simbiso Ranga, MD   80 mg at 06/02/14 0940  . ceFEPIme (MAXIPIME) 1 g in dextrose 5 % 50 mL IVPB  1 g Intravenous 3 times per day Conley CanalSimbiso Ranga, MD   1 g at 06/02/14 0526  . enoxaparin (LOVENOX) injection 40 mg  40 mg Subcutaneous Q24H Simbiso Ranga, MD   40 mg at 06/01/14 2130  . HYDROcodone-acetaminophen (NORCO/VICODIN) 5-325  MG per tablet 1-2 tablet  1-2 tablet Oral Q4H PRN Conley CanalSimbiso Ranga, MD   2 tablet at 06/02/14 0939  . insulin aspart (novoLOG) injection 0-9 Units  0-9 Units Subcutaneous TID WC Conley CanalSimbiso Ranga, MD   2 Units at 06/02/14 0803  . insulin detemir (LEVEMIR) injection 10 Units  10 Units Subcutaneous QHS Simbiso Ranga, MD   10 Units at 06/01/14 2142  . isosorbide mononitrate (IMDUR) 24 hr tablet 30 mg  30 mg Oral Daily Simbiso Ranga, MD   30 mg at 06/02/14 0940  . lisdexamfetamine (VYVANSE) capsule 40 mg  40 mg Oral q morning - 10a Simbiso Ranga, MD   40 mg at 06/02/14 0940  . lisinopril (PRINIVIL,ZESTRIL) tablet 40 mg  40 mg Oral Daily Simbiso Ranga, MD   40 mg at 06/02/14 0940  . morphine 2 MG/ML injection 2 mg  2 mg Intravenous Q4H PRN Simbiso Ranga, MD   2 mg at 06/02/14 0939  . nicotine (NICODERM CQ - dosed in mg/24 hours) patch 21 mg  21 mg Transdermal Daily Simbiso Ranga, MD   21 mg at 06/02/14 0943  . ondansetron (ZOFRAN) tablet 4 mg  4 mg Oral Q6H PRN Simbiso Ranga, MD       Or  . ondansetron (ZOFRAN) injection 4 mg  4 mg Intravenous Q6H PRN Simbiso Ranga, MD   4 mg at 06/02/14 0555  . pantoprazole (PROTONIX) EC tablet 40 mg  40 mg Oral Daily Simbiso Ranga, MD   40 mg at 06/02/14 0940  . pregabalin (LYRICA) capsule 100 mg  100 mg Oral Daily Simbiso Ranga, MD   100 mg at 06/02/14 0940  . senna-docusate (Senokot-S) tablet 1 tablet  1 tablet Oral QHS PRN Simbiso Ranga, MD      . vancomycin (VANCOCIN) 1,250 mg in sodium chloride 0.9 % 250 mL IVPB  1,250 mg Intravenous Q12H Simbiso Ranga, MD   1,250 mg at 06/02/14 0941  . zolpidem (AMBIEN) tablet 5 mg  5 mg Oral QHS PRN Simbiso Ranga, MD   5 mg at 06/01/14 2305    Musculoskeletal: Strength & Muscle Tone: within normal limits Gait & Station: normal Patient leans: N/A  Psychiatric Specialty Exam: Physical Exam as per history and physical   ROS hand pain secondary to infection   Blood pressure 172/72, pulse 78, temperature 98 F (36.7 C),  temperature source Oral, resp. rate 20, SpO2 100 %.There is no weight on file to calculate BMI.  General Appearance: Casual  Eye Contact::  Good  Speech:  Clear and Coherent  Volume:  Normal  Mood:  Euthymic  Affect:  Appropriate and Congruent  Thought Process:  Coherent and Goal Directed  Orientation:  Full (Time, Place, and Person)  Thought Content:  WDL  Suicidal Thoughts:  No  Homicidal Thoughts:  No  Memory:  Immediate;   Good Recent;   Good  Judgement:  Intact  Insight:  Good  Psychomotor Activity:  Normal  Concentration:  Good  Recall:  Good  Fund of Knowledge:Good  Language: Good  Akathisia:  Negative  Handed:  Right  AIMS (if indicated):     Assets:  Communication Skills Desire for Improvement Financial Resources/Insurance Housing Intimacy Leisure Time Resilience Social Support Talents/Skills  ADL's:  Intact  Cognition: WNL  Sleep:      Medical Decision Making: New problem, with additional work up planned, Review of Psycho-Social Stressors (1), Review or order clinical lab tests (1), Established Problem, Worsening (2), Review or order medicine tests (1), Review of Medication Regimen & Side Effects (2) and Review of New Medication or Change in Dosage (2)  Treatment Plan Summary: Daily contact with patient to assess and evaluate symptoms and progress in treatment and Medication management  Plan:  Continue current medication management without additional psychotropics Patient does not meet criteria for psychiatric inpatient admission. Supportive therapy provided about ongoing stressors. Appreciate psychiatric consultation and follow up as clinically required Please contact [phone] or [phone] if needs further assistance  Disposition: Patient does not meet criteria for acute psychiatric hospitalization at this time and patient will be referred to the outpatient psychiatric medication management upon medically cleared.  Tomoko Sandra,JANARDHAHA R. 06/02/2014  9:53 AM

## 2014-06-02 NOTE — Progress Notes (Signed)
Peripherally Inserted Central Catheter/Midline Placement  The IV Nurse has discussed with the patient and/or persons authorized to consent for the patient, the purpose of this procedure and the potential benefits and risks involved with this procedure.  The benefits include less needle sticks, lab draws from the catheter and patient may be discharged home with the catheter.  Risks include, but not limited to, infection, bleeding, blood clot (thrombus formation), and puncture of an artery; nerve damage and irregular heat beat.  Alternatives to this procedure were also discussed.  PICC/Midline Placement Documentation  PICC / Midline Single Lumen 06/02/14 PICC Left Brachial 45 cm 1 cm (Active)  Indication for Insertion or Continuance of Line Prolonged intravenous therapies;Limited venous access - need for IV therapy >5 days (PICC only) 06/02/2014  4:51 PM  Exposed Catheter (cm) 1 cm 06/02/2014  4:51 PM  Site Assessment Clean;Dry;Intact 06/02/2014  4:51 PM  Line Status Flushed;Saline locked;Blood return noted 06/02/2014  4:51 PM  Dressing Type Transparent 06/02/2014  4:51 PM  Dressing Status Clean;Dry;Intact;Antimicrobial disc in place 06/02/2014  4:51 PM  Line Care Connections checked and tightened 06/02/2014  4:51 PM  Line Adjustment (NICU/IV Team Only) No 06/02/2014  4:51 PM  Dressing Intervention New dressing 06/02/2014  4:51 PM  Dressing Change Due 06/09/14 06/02/2014  4:51 PM       Elliot Dallyiggs, Tavarius Grewe Wright 06/02/2014, 4:51 PM

## 2014-06-02 NOTE — Progress Notes (Signed)
[facility] PROGRESS NOTE  Shirley Short WUJ:[id number] DOB: 03-23-1961 DOA: 06/01/2014 PCP: No PCP Per Patient  Summary Appreciate psychiatrist/wound care. Shirley Short is a pleasant 54 year old female with multiple medical problems including essential hypertension/hyperlipidemia/CAD being medically treated/COPD/tobacco dependency/morbid obesity BMI 30-39.9/diabetes mellitus type2, chronic pain disorder, among other medical problems who [facility](where she was [facility] for Major Depression with suicidal ideation), after consultation for worsening cellulitis of the right hand s/p incision and drainage a few days ago by Dr Mina Marble with wound cultures growing staph aureus. She was noted to have worsening swelling of the hand with increasing pain. She had been placed on Doxycycline after incision and drainage at [facility] where she was admitted to the cardiology service as transfer from [facility] for suspected acute myocardial infarction in setting of active cocaine use, LBBB on EKG. She had cardiac catheterization with finding of "...coronary vasospasm and distal LAD and RCA disease. Medical therapy was recommended...'. Apparently, cellulitis had worsened since transfer therefore reason for consultation. At the time of consultation at [facility] it was apparent that patient needed IV antibiotics therefore transferred to the medical service. Her hand looks better today with less inflammation and drainage. She reports severe pain still but this is likely related to high tolerance for narcotics. Will increase dose of morphine as needed. There was concern that she had local reaction to antibiotics but I think this was due to local infiltration. She had difficult IV access and therefore a PICC line was placed today. She should be able to continue vancomycin/cefepime and if there is reaction despite PICC line, would then need to change antibiotics. Plan Abscess and cellulitis of right  hand  Follow septic workup  Continue IV fluids/vancomycin/cefepime per pharmacy  Follow Wound care recommendations Cocaine abuse/Chronic pain disorder/MDD (major depressive disorder), recurrent, severe, with psychosis/Suicidal ideation  Defer management to psychiatry  Pain meds as necessary DM2 (diabetes mellitus, type 2)  Hemoglobin A1c 12.1 on 05/24/14.  Patient will need insulin regimen and discharge  Continue Lantus/SSI Obesity (BMI 30-39.9)/COPD (chronic obstructive pulmonary disease)/Tobacco abuse/Hypertension/Hyperlipidemia/CAD (coronary artery disease)  No acute changes  Continue home meds DVT/GI Prophylaxis  Lovenox/PPI Code Status: Full Family Communication: None at bedside Disposition Plan: Eventually home   Consultants:  Psychiatry  Wound Care  Procedures:  PICC line placement  Antibiotics:  Vancomycin 06/01/2014  Cefepime 06/01/2014  HPI/Subjective: Complains of severe pain  Objective: Filed Vitals:   06/02/14 1413  BP: 148/73  Pulse: 74  Temp: 98.5 F (36.9 C)  Resp: 20    Intake/Output Summary (Last 24 hours) at 06/02/14 2055 Last data filed at 06/02/14 1758  Gross per 24 hour  Intake    480 ml  Output    800 ml  Net   -320 ml   There were no vitals filed for this visit.  Exam:   General:  Comfortable at rest.  Cardiovascular: S1-S2 normal. No murmurs. Pulse regular.  Respiratory: Good air entry bilaterally. No rhonchi or rales.  Abdomen: Soft and nontender. Normal bowel sounds. No organomegaly.  Musculoskeletal: No pedal edema. Less erythema right hand. No drainage.   Neurological: Intact  Data Reviewed: Basic Metabolic Panel:  Recent Labs Lab 05/28/14 0343 05/29/14 0548 05/30/14 0305 06/02/14 0520  NA 137 136 137 137  K 4.5 4.7 4.8 4.6  CL 97 101 98 107  CO2 32 GLUCOSE 220* 152* 182* 151*  BUN CREATININE 0.76 0.59 0.70 0.74  CALCIUM  9.3 9.2 9.6 8.8   Liver Function  Tests:  Recent Labs Lab 06/02/14 0520  AST 39*  ALT 25  ALKPHOS 112  BILITOT 0.2*  PROT 6.1  ALBUMIN 2.8*   No results for input(s): LIPASE, AMYLASE in the last 168 hours. No results for input(s): AMMONIA in the last 168 hours. CBC:  Recent Labs Lab 05/28/14 0343 05/30/14 0305 06/02/14 0520  WBC 8.0 8.9 8.4  HGB 11.7* 12.2 11.6*  HCT 36.1 37.7 36.5  MCV 83.4 83.2 84.7  PLT 159 210 230   Cardiac Enzymes: No results for input(s): CKTOTAL, CKMB, CKMBINDEX, TROPONINI in the last 168 hours. BNP (last 3 results) No results for input(s): BNP in the last 8760 hours.  ProBNP (last 3 results) No results for input(s): PROBNP in the last 8760 hours.  CBG:  Recent Labs Lab 06/01/14 1709 06/01/14 2303 06/02/14 0738 06/02/14 1223 06/02/14 1748  GLUCAP 267* 151* 154* 243* 222*    Recent Results (from the past 240 hour(s))  Anaerobic culture     Status: None   Collection Time: 05/25/14  2:50 PM  Result Value Ref Range Status   Specimen Description WOUND HAND RIGHT  Final   Special Requests NONE  Final   Gram Stain   Final    NO SQUAMOUS EPITHELIAL CELLS SEEN NO ORGANISMS SEEN DEGENERATED CELLULAR MATERIAL PRESENT [facility]formed [facility] Performed at [facility]    Culture   Final    NO ANAEROBES ISOLATED Performed at [facility]    Report Status 05/30/2014 FINAL  Final  Gram stain     Status: None   Collection Time: 05/25/14  2:50 PM  Result Value Ref Range Status   Specimen Description WOUND HAND RIGHT  Final   Special Requests NONE  Final   Gram Stain   Final    DEGENERATED CELLULAR MATERIAL PRESENT NO ORGANISMS SEEN    Report Status 05/25/2014 FINAL  Final  Wound culture     Status: None   Collection Time: 05/25/14  2:50 PM  Result Value Ref Range Status   Specimen Description WOUND HAND RIGHT  Final   Special Requests NONE  Final   Gram Stain   Final    NO WBC SEEN NO SQUAMOUS EPITHELIAL CELLS SEEN NO ORGANISMS  SEEN DEGENERATED CELLULAR MATERIAL PRESENT Performed at [facility] Performed at [facility]    Culture   Final    MODERATE STAPHYLOCOCCUS AUREUS Note: RIFAMPIN AND GENTAMICIN SHOULD NOT BE USED AS SINGLE DRUGS FOR TREATMENT OF STAPH INFECTIONS. Performed at [facility]    Report Status 05/28/2014 FINAL  Final   Organism ID, Bacteria STAPHYLOCOCCUS AUREUS  Final      Susceptibility   Staphylococcus aureus - MIC*    CLINDAMYCIN <=0.25 SENSITIVE Sensitive     ERYTHROMYCIN <=0.25 SENSITIVE Sensitive     GENTAMICIN <=0.5 SENSITIVE Sensitive     LEVOFLOXACIN 0.25 SENSITIVE Sensitive     OXACILLIN <=0.25 SENSITIVE Sensitive     PENICILLIN >=0.5 RESISTANT Resistant     RIFAMPIN <=0.5 SENSITIVE Sensitive     TRIMETH/SULFA <=10 SENSITIVE Sensitive     VANCOMYCIN <=0.5 SENSITIVE Sensitive     TETRACYCLINE <=1 SENSITIVE Sensitive     MOXIFLOXACIN <=0.25 SENSITIVE Sensitive     * MODERATE STAPHYLOCOCCUS AUREUS  AFB culture with smear     Status: None (Preliminary result)   Collection Time: 05/25/14  2:50 PM  Result Value Ref Range Status   Specimen Description  WOUND HAND RIGHT  Final   Special Requests NONE  Final   Acid Fast Smear   Final    NO ACID FAST BACILLI SEEN Performed at [facility]    Culture   Final    CULTURE WILL BE EXAMINED FOR 6 WEEKS BEFORE ISSUING A FINAL REPORT Performed at [facility]    Report Status PENDING  Incomplete     Studies: Portable Chest 1 View  06/01/2014   CLINICAL DATA:  History of smoking, recent myocardial infarction, hypertension and CHF.  EXAM: PORTABLE CHEST - 1 VIEW  COMPARISON:  05/23/2014; 02/15/2013  FINDINGS: Grossly unchanged cardiac silhouette and mediastinal contours given persistently reduced lung volumes. No focal airspace opacities. No pleural effusion or pneumothorax. No evidence of edema. No acute osseus abnormalities.  IMPRESSION: No definite acute cardiopulmonary disease on this AP portable  examination.   Electronically Signed   By: Simonne Come M.D.   On: 06/01/2014 20:09    Scheduled Meds: . amitriptyline  100 mg Oral QHS  . amLODipine  5 mg Oral Daily  . aspirin EC  81 mg Oral Daily  . atorvastatin  80 mg Oral Daily  . ceFEPime (MAXIPIME) IV  1 g Intravenous 3 times per day  . enoxaparin (LOVENOX) injection  40 mg Subcutaneous Q24H  . insulin aspart  0-9 Units Subcutaneous TID WC  . insulin detemir  10 Units Subcutaneous QHS  . isosorbide mononitrate  30 mg Oral Daily  . lisdexamfetamine  40 mg Oral q morning - 10a  . lisinopril  40 mg Oral Daily  . nicotine  21 mg Transdermal Daily  . pantoprazole  40 mg Oral Daily  . pregabalin  100 mg Oral Daily  . vancomycin  1,250 mg Intravenous Q12H   Continuous Infusions: . sodium chloride 50 mL/hr at 06/02/14 1652     Time spent: 25 minutes    Saniya Tranchina  [facility] Pager [phone]. If 7PM-7AM, please contact night-coverage at [email or web address], password [facility] 06/02/2014, 8:55 PM  LOS: 1 day

## 2014-06-02 NOTE — Progress Notes (Signed)
Clinical Social Work Department CLINICAL SOCIAL WORK PSYCHIATRY SERVICE LINE ASSESSMENT 06/02/2014  Patient:  Shirley Short  Account:  [id number]  [place] Date:  06/01/2014  Clinical Social Worker:  Sindy Messing, LCSW  Date/Time:  06/02/2014 04:00 PM Referred by:  Physician  Date referred:  06/02/2014 Reason for Referral  Psychosocial assessment   Presenting Symptoms/Problems (In the person's/family's own words):   Psych consulted due to patient being admitted from [facility].   Abuse/Neglect/Trauma History (check all that apply)  Denies history   Abuse/Neglect/Trauma Comments:   Psychiatric History (check all that apply)  Outpatient treatment  Inpatient/hospitilization   Psychiatric medications:  Elavil 100 mg  Vyvanse 40 mg   Current Mental Health Hospitalizations/Previous Mental Health History:   Patient reports she was diagnosed with major depression. Patient has outpatient follow up for medication management and therapy.   Current provider:   [facility] [place] and Date:   [place], [place]   Current Medications:   Scheduled Meds:      . amitriptyline  100 mg Oral QHS  . amLODipine  5 mg Oral Daily  . aspirin EC  81 mg Oral Daily  . atorvastatin  80 mg Oral Daily  . ceFEPime (MAXIPIME) IV  1 g Intravenous 3 times per day  . enoxaparin (LOVENOX) injection  40 mg Subcutaneous Q24H  . insulin aspart  0-9 Units Subcutaneous TID WC  . insulin detemir  10 Units Subcutaneous QHS  . isosorbide mononitrate  30 mg Oral Daily  . lisdexamfetamine  40 mg Oral q morning - 10a  . lisinopril  40 mg Oral Daily  . nicotine  21 mg Transdermal Daily  . pantoprazole  40 mg Oral Daily  . pregabalin  100 mg Oral Daily  . vancomycin  1,250 mg Intravenous Q12H        Continuous Infusions:      . sodium chloride 50 mL/hr at 06/01/14 2129          PRN Meds:.acetaminophen **OR** acetaminophen, albuterol, alum & mag hydroxide-simeth, HYDROcodone-acetaminophen, morphine  injection, ondansetron **OR** ondansetron (ZOFRAN) IV, senna-docusate, zolpidem       Previous Impatient Admission/Date/Reason:   Patient was admitted from [facility].   Emotional Health / Current Symptoms    Suicide/Self Harm  None reported   Suicide attempt in the past:   Patient denies any SI or HI and contracts for safety.   Other harmful behavior:   None reported   Psychotic/Dissociative Symptoms  None reported   Other Psychotic/Dissociative Symptoms:   N/A    Attention/Behavioral Symptoms  Within Normal Limits   Other Attention / Behavioral Symptoms:   Patient engaged during assessment.    Cognitive Impairment  Within Normal Limits   Other Cognitive Impairment:   Patient alert and oriented during assessment.    Mood and Adjustment  DEPRESSION    Stress, Anxiety, Trauma, Any Recent Loss/Stressor  Grief/Loss (recent or history)   Anxiety (frequency):   N/A   Phobia (specify):   N/A   Compulsive behavior (specify):   N/A   Obsessive behavior (specify):   N/A   Other:   Patient reports she is still grieving over the loss of her mother.   Substance Abuse/Use  Current substance use   SBIRT completed (please refer for detailed history):  N  Self-reported substance use:   Patient reports she has used cocaine in the past. Patient does not wish to complete SBIRT and reports she does not want to discuss her  substance use.   Urinary Drug Screen Completed:  N Alcohol level:   N/A    Environmental/Housing/Living Arrangement  Stable housing   Who is in the home:   Stepfather and boyfriend   Emergency contact:  [place]   Patient's Strengths and Goals (patient's own words):   Patient reports she has a good relationship with stepfather. Patient is active with outpatient treatment for medication management.   Clinical Social Worker's Interpretive Summary:   CSW received referral in order to complete psychosocial  assessment. CSW reviewed chart and met with patient at bedside. CSW staffed case with psych MD who reports patient is no longer experiencing any SI and can be cleared to DC with outpatient services when medically stable.    CSW met with patient at bedside. CSW introduced myself and explained role. Patient reports she lives at home with her stepfather and her boyfriend. Patient has been dating her boyfriend for 10 years and reports their relationship is good but they "have rough times." Patient has a dtr that lives in [place] and she recently had a son about 2 weeks ago. Patient tearful when talking about family and reports she misses her mother that passed away and her dtr that lives out of town.    Patient's chart review mentioned substance use but when questioned about substance use patient reports she does not want to discuss her use. Patient reports it is more important in order to get treatment for her medical concerns.    CSW spoke with patient re: depression. Patient reports she was overwhelmed with hospitalization but does not feel [facility] was beneficial. Patient reports she is not experiencing any SI and contracts for safety. Patient reports a good relationship with her psychiatrist and prefers to follow up with outpatient services.    Patient engaged during assessment but tearful at times. Patient guarded when discussing substance use but open to discussing her MH needs. CSW will continue to follow to provide support throughout hospital stay.   Disposition:  Outpatient referral made/needed   [place], [place] [phone]

## 2014-06-02 NOTE — Progress Notes (Signed)
Dear Doctor: Shirley Short This patient has been identified as a candidate for PICC for the following reason (s): drug pH or osmolality (causing phlebitis, infiltration in 24 hours) If you agree, please write an order for the indicated device. For any questions contact the Vascular Access Team at [phone] if no answer, please leave a message.  Thank you for supporting the early vascular access assessment program.

## 2014-06-03 LAB — GLUCOSE, CAPILLARY
GLUCOSE-CAPILLARY: 178 mg/dL — AB (ref 70–99)
GLUCOSE-CAPILLARY: 210 mg/dL — AB (ref 70–99)
Glucose-Capillary: 190 mg/dL — ABNORMAL HIGH (ref 70–99)
Glucose-Capillary: 194 mg/dL — ABNORMAL HIGH (ref 70–99)

## 2014-06-03 LAB — CBC
HCT: 37.1 % (ref 36.0–46.0)
Hemoglobin: 11.8 g/dL — ABNORMAL LOW (ref 12.0–15.0)
MCH: 26.9 pg (ref 26.0–34.0)
MCHC: 31.8 g/dL (ref 30.0–36.0)
MCV: 84.5 fL (ref 78.0–100.0)
PLATELETS: 259 10*3/uL (ref 150–400)
RBC: 4.39 MIL/uL (ref 3.87–5.11)
RDW: 13.3 % (ref 11.5–15.5)
WBC: 10.9 10*3/uL — ABNORMAL HIGH (ref 4.0–10.5)

## 2014-06-03 LAB — BASIC METABOLIC PANEL
ANION GAP: 7 (ref 5–15)
BUN: 14 mg/dL (ref 6–23)
CHLORIDE: 99 mmol/L (ref 96–112)
CO2: 29 mmol/L (ref 19–32)
CREATININE: 0.63 mg/dL (ref 0.50–1.10)
Calcium: 9.1 mg/dL (ref 8.4–10.5)
GFR calc non Af Amer: >90 mL/min (ref >90)
Glucose, Bld: 255 mg/dL — ABNORMAL HIGH (ref 70–99)
Potassium: 4.6 mmol/L (ref 3.5–5.1)
SODIUM: 135 mmol/L (ref 135–145)

## 2014-06-03 LAB — VANCOMYCIN, TROUGH: Vancomycin Tr: 14.3 ug/mL (ref 10.0–20.0)

## 2014-06-03 LAB — URINE CULTURE: Colony Count: 3000

## 2014-06-03 NOTE — Progress Notes (Signed)
[facility] PROGRESS NOTE  Shirley SimmondsDonna L Short ZOX:[id number] DOB: 10/11/1960 DOA: 06/01/2014 PCP: No PCP Per Patient  Summary Appreciate psychiatrist/wound care. Shirley BaneDonna Short is a pleasant 54 year old female with multiple medical problems including essential hypertension/hyperlipidemia/CAD being medically treated/COPD/tobacco dependency/morbid obesity BMI 30-39.9/diabetes mellitus type2, chronic pain disorder, among other medical problems who [facility](where she was [facility] for Major Depression with suicidal ideation), after consultation for worsening cellulitis of the right hand s/p incision and drainage a few days ago by Dr Mina MarbleWeingold with wound cultures growing staph aureus. She was noted to have worsening swelling of the hand with increasing pain. She had been placed on Doxycycline after [facility] where she was admitted to the cardiology service [facility] infarction in setting of active cocaine use, LBBB on EKG. She had cardiac catheterization with finding of "...coronary vasospasm and distal LAD and RCA disease. Medical therapy was recommended...'. Apparently, cellulitis had worsened since transfer therefore reason for consultation. [facility] was apparent that patient needed IV antibiotics therefore transferred to the medical service. Her hand looks better today with less inflammation and drainage. She reports that pain is better. There was concern that she had local reaction to antibiotics but I think this was due to local infiltration. She had difficult IV access and therefore a PICC line was place. She is tolerating vancomycin/cefepime. Will plan DC home on oral antibiotics in a.m. if she continues to do well. Will follow psychiatry recommendations. Plan Abscess and cellulitis of right hand  septic workup negative  Continue vancomycin/cefepime per pharmacy  Follow  Wound care recommendations Cocaine abuse/Chronic pain disorder/MDD (major depressive disorder), recurrent, severe, with psychosis/Suicidal ideation  Defer management to psychiatry  Pain meds as necessary DM2 (diabetes mellitus, type 2)  Hemoglobin A1c 12.1 on 05/24/14.  Patient will need insulin regimen and discharge  Continue Lantus/SSI Obesity (BMI 30-39.9)/COPD (chronic obstructive pulmonary disease)/Tobacco abuse/Hypertension/Hyperlipidemia/CAD (coronary artery disease)  No acute changes  Continue home meds DVT/GI Prophylaxis  Lovenox/PPI Code Status: Full Family Communication: None at bedside Disposition Plan: Eventually home   Consultants:  Psychiatry  Wound Care  Procedures:  PICC line placement  Antibiotics:  Vancomycin 06/01/2014  Cefepime 06/01/2014  HPI/Subjective: Says pain is better.  Objective: Filed Vitals:   06/03/14 1420  BP: 154/73  Pulse: 77  Temp: 98.2 F (36.8 C)  Resp: 20    Intake/Output Summary (Last 24 hours) at 06/03/14 2026 Last data filed at 06/03/14 0847  Gross per 24 hour  Intake      0 ml  Output    350 ml  Net   -350 ml   Filed Weights   06/02/14 2312  Weight: 87.9 kg (193 lb 12.6 oz)    Exam:   General:  Comfortable at rest.  Cardiovascular: S1-S2 normal. No murmurs. Pulse regular.  Respiratory: Good air entry bilaterally. No rhonchi or rales.  Abdomen: Soft and nontender. Normal bowel sounds. No organomegaly.  Musculoskeletal: No pedal edema. No drainage from right hand.  Neurological: Intact  Data Reviewed: Basic Metabolic Panel:  Recent Labs Lab 05/28/14 0343 05/29/14 0548 05/30/14 0305 06/02/14 0520 06/03/14 0450  NA 137 136 137 137 135  K 4.5 4.7 4.8 4.6 4.6  CL 97 101 98 107 99  CO2 32 31 31 22 29   GLUCOSE 220* 152* 182* 151* 255*  BUN 10 8 11 11 14   CREATININE 0.76 0.59 0.70 0.74 0.63  CALCIUM 9.3 9.2 9.6 8.8 9.1   Liver Function Tests:  Recent Labs Lab 06/02/14 0520  AST  39*  ALT 25  ALKPHOS 112  BILITOT 0.2*  PROT 6.1  ALBUMIN 2.8*   No results for input(s): LIPASE, AMYLASE in the last 168 hours. No results for input(s): AMMONIA in the last 168 hours. CBC:  Recent Labs Lab 05/28/14 0343 05/30/14 0305 06/02/14 0520 06/03/14 0450  WBC 8.0 8.9 8.4 10.9*  HGB 11.7* 12.2 11.6* 11.8*  HCT 36.1 37.7 36.5 37.1  MCV 83.4 83.2 84.7 84.5  PLT 159 210 230 259   Cardiac Enzymes: No results for input(s): CKTOTAL, CKMB, CKMBINDEX, TROPONINI in the last 168 hours. BNP (last 3 results) No results for input(s): BNP in the last 8760 hours.  ProBNP (last 3 results) No results for input(s): PROBNP in the last 8760 hours.  CBG:  Recent Labs Lab 06/02/14 1748 06/02/14 2209 06/03/14 0804 06/03/14 1137 06/03/14 1657  GLUCAP 222* 230* 190* 178* 194*    Recent Results (from the past 240 hour(s))  Anaerobic culture     Status: None   Collection Time: 05/25/14  2:50 PM  Result Value Ref Range Status   Specimen Description WOUND HAND RIGHT  Final   Special Requests NONE  Final   Gram Stain   Final    NO SQUAMOUS EPITHELIAL CELLS SEEN NO ORGANISMS SEEN DEGENERATED CELLULAR MATERIAL PRESENT Performed at [facility] Performed at [facility]    Culture   Final    NO ANAEROBES ISOLATED Performed at [facility]    Report Status 05/30/2014 FINAL  Final  Gram stain     Status: None   Collection Time: 05/25/14  2:50 PM  Result Value Ref Range Status   Specimen Description WOUND HAND RIGHT  Final   Special Requests NONE  Final   Gram Stain   Final    DEGENERATED CELLULAR MATERIAL PRESENT NO ORGANISMS SEEN    Report Status 05/25/2014 FINAL  Final  Wound culture     Status: None   Collection Time: 05/25/14  2:50 PM  Result Value Ref Range Status   Specimen Description WOUND HAND RIGHT  Final   Special Requests NONE  Final   Gram Stain   Final    NO WBC SEEN NO SQUAMOUS EPITHELIAL CELLS SEEN NO ORGANISMS SEEN DEGENERATED  CELLULAR MATERIAL PRESENT Performed at [facility] Performed at [facility]    Culture   Final    MODERATE STAPHYLOCOCCUS AUREUS Note: RIFAMPIN AND GENTAMICIN SHOULD NOT BE USED AS SINGLE DRUGS FOR TREATMENT OF STAPH INFECTIONS. Performed at [facility]    Report Status 05/28/2014 FINAL  Final   Organism ID, Bacteria STAPHYLOCOCCUS AUREUS  Final      Susceptibility   Staphylococcus aureus - MIC*    CLINDAMYCIN <=0.25 SENSITIVE Sensitive     ERYTHROMYCIN <=0.25 SENSITIVE Sensitive     GENTAMICIN <=0.5 SENSITIVE Sensitive     LEVOFLOXACIN 0.25 SENSITIVE Sensitive     OXACILLIN <=0.25 SENSITIVE Sensitive     PENICILLIN >=0.5 RESISTANT Resistant     RIFAMPIN <=0.5 SENSITIVE Sensitive     TRIMETH/SULFA <=10 SENSITIVE Sensitive     VANCOMYCIN <=0.5 SENSITIVE Sensitive     TETRACYCLINE <=1 SENSITIVE Sensitive     MOXIFLOXACIN <=0.25 SENSITIVE Sensitive     * MODERATE STAPHYLOCOCCUS AUREUS  AFB culture with smear     Status: None (Preliminary result)   Collection Time: 05/25/14  2:50 PM  Result Value Ref Range Status   Specimen Description WOUND HAND RIGHT  Final   Special Requests NONE  Final   Acid Fast Smear   Final    NO ACID FAST BACILLI SEEN Performed at [facility]    Culture   Final    CULTURE WILL BE EXAMINED FOR 6 WEEKS BEFORE ISSUING A FINAL REPORT Performed at [facility]    Report Status PENDING  Incomplete  Urine culture     Status: None   Collection Time: 06/02/14  5:41 AM  Result Value Ref Range Status   Specimen Description URINE, CLEAN CATCH  Final   Special Requests NONE  Final   Colony Count   Final    3,000 COLONIES/ML Performed at [facility]    Culture   Final    INSIGNIFICANT GROWTH Performed at [facility]    Report Status 06/03/2014 FINAL  Final     Studies: No results found.  Scheduled Meds: . amitriptyline  100 mg Oral QHS  . amLODipine  5 mg Oral Daily  . aspirin EC  81 mg  Oral Daily  . atorvastatin  80 mg Oral Daily  . ceFEPime (MAXIPIME) IV  1 g Intravenous 3 times per day  . enoxaparin (LOVENOX) injection  40 mg Subcutaneous Q24H  . insulin aspart  0-9 Units Subcutaneous TID WC  . insulin detemir  14 Units Subcutaneous QHS  . isosorbide mononitrate  30 mg Oral Daily  . lisdexamfetamine  40 mg Oral q morning - 10a  . lisinopril  40 mg Oral Daily  . nicotine  21 mg Transdermal Daily  . pantoprazole  40 mg Oral Daily  . pregabalin  100 mg Oral Daily  . vancomycin  1,250 mg Intravenous Q12H   Continuous Infusions:    Time spent: 15 minutes    Lenward Able  [facility] Pager [phone]. If 7PM-7AM, please contact night-coverage at [email or web address], password [facility] 06/03/2014, 8:26 PM  LOS: 2 days

## 2014-06-03 NOTE — Progress Notes (Signed)
Patient Discharge Instructions:  Next Level Care Provider Has Access to the EMR, 06/03/14  The patient was readmitted to [facility].  The entire medical record is made available via CHL/Epic access.  Jerelene ReddenSheena E [place], 06/03/2014, 3:53 PM

## 2014-06-03 NOTE — Progress Notes (Signed)
Inpatient Diabetes Program Recommendations  AACE/ADA: New Consensus Statement on Inpatient Glycemic Control (2013)  Target Ranges:  Prepandial:   less than 140 mg/dL      Peak postprandial:   less than 180 mg/dL (1-2 hours)      Critically ill patients:  140 - 180 mg/dL   Reason for Visit: Hyperglycemia  Results for Shirley Short, Shirley Short (MRN [id number]) as of 06/03/2014 15:44  Ref. Range 06/02/2014 05:20 06/03/2014 04:50  Glucose Latest Range: 70-99 mg/dL 409151 (H) 811255 (H)  Results for Shirley Short, Shirley Short (MRN [id number]) as of 06/03/2014 15:44  Ref. Range 06/02/2014 12:23 06/02/2014 17:48 06/02/2014 22:09 06/03/2014 08:04 06/03/2014 11:37  Glucose-Capillary Latest Range: 70-99 mg/dL 213243 (H) 086222 (H) 578230 (H) 190 (H) 178 (H)   Blood sugars improving slightly today. FBS elevated. Eating approx 50% meals.  Recommendations: Increase Levemir to18 units QHS Add Novolog 3 units tidwc for meal coverage insulin if pt eats >50% meals. Increase Novolog correction to moderate tidwc and hs.  Will continue to follow. Thank you. Ailene Ardshonda Zaevion Parke, RD, LDN, CDE Inpatient Diabetes Coordinator [phone]

## 2014-06-03 NOTE — Consult Note (Signed)
Psychiatry Consult Follow up  Reason for Consult:  Depression and substance abuse Referring Physician:  Dr. Venetia Constable Patient Identification: Shirley Short MRN:  [id number] Principal Diagnosis: MDD (major depressive disorder), recurrent, severe, with psychosis Diagnosis:   Patient Active Problem List   Diagnosis Date Noted  . Abscess and cellulitis [L03.90, L02.91] 06/01/2014  . Chronic pain disorder [G89.4] 05/31/2014  . MDD (major depressive disorder) [F32.2] 05/31/2014  . Suicidal ideation [R45.851]   . Abscess of right hand [L02.511]   . CAD (coronary artery disease) [I25.10]   . MDD (major depressive disorder), recurrent, severe, with psychosis [F33.3] 05/29/2014  . Cocaine abuse [F14.10] 05/23/2014  . CAD (coronary artery disease), native coronary artery [I25.10] 05/23/2014  . Obesity (BMI 30-39.9) [E66.9] 05/23/2014  . COPD (chronic obstructive pulmonary disease) [J44.9] 05/23/2014  . Hyperlipidemia [E78.5] 05/23/2014  . DM2 (diabetes mellitus, type 2) [E11.9]   . Noncompliance [Z91.19]   . Tobacco abuse [Z72.0]   . Hypertension [I10]     Total Time spent with patient: 20 minutes  Subjective:   Shirley Short is a 54 y.o. female patient admitted with depression and substance abuse.  HPI: Shirley Short is a 54 year old female seen and chart reviewed for psychiatric consultation and evaluation of depression, suicidal ideation and history of substance abuse. Patient was known to this provider from her previous encounter at [facility]. Patient was initially presented at [facility] in [place] with chest pain and then transferred to [facility]. Patient complained about depression and suicidal ideation and then referred to the behavioral health Hospital. Patient was admitted to [facility] secondary to increased infection of the hand and uncontrollable pain. Patient currently denies symptoms of depression, anxiety, mania, psychosis and denies  current suicidal, homicidal ideation, intention or plans. Patient reported she has a grandchild in [place], [place] and planning to go and see her and spend time upon medically cleared. Patient denies previous history of suicidal attempt or family history of suicide. Patient has no craving for drugs or withdrawal symptoms at this time. Patient was previously seen mental health provider at [facility].  Interval History: Patient stated that her hand pain is much better controlled with her pain medication and endorses had cocaine binge about a month prior to acute cardiac event. She reports stopped taking her medications due to not want to mix with her drugs. She states that she wants to take xanax and Vyvanse but does not care about Cymbalta. She seems like seeking medication with high drug abuse vs dependence potential. She wishes to go to substance abuse rehab treatment at [facility] or in [place] when medically cleared. Reports her primary psych provider aware of her drug of abuse and she did not hide the substance abuse. Patient husband and papa are her supportive system. She continue to deny SI/HI and psychosis.   Medical history: Patient with multiple medical problems including essential hypertension/hyperlipidemia/CAD being medically treated/COPD/tobacco dependency/morbid obesity BMI 30-39.9/diabetes mellitus type2, chronic pain disorder, among other medical problems who was seen [facility](where she was admitted for Major Depression with suicidal ideation), after consultation for worsening cellulitis of the right hand s/p incision and drainage a few days ago by Dr Mina Marble with wound cultures growing staph aureus. She was noted to have worsening swelling of the hand with increasing pain. She had been placed on Doxycycline after incision and drainage at [facility] where she was admitted to the cardiology service as transfer from  [place] for suspected acute  myocardial infarction in setting of active cocaine use, LBBB on EKG. She had cardiac catheterization with finding of "...coronary vasospasm and distal LAD and RCA disease. Medical therapy was recommended...'. Apparently, cellulitis has worsened since transfer therefore reason for consultation. At this point it appears that patient needs IV antibiotics and may need reevaluation by orthopedics/wound care. She has therefore been transferred to [facility] where she can get IV antibiotics and comprehensive workup. Will obtain CBC/CMP/UA/urine culture/chest x-ray, start IV fluids, and start vancomycin/cefepime and reassess depending on preliminary results. Patient should continue wound care as instructed by orthopedics. She will be followed by psychiatry for management of major depression. She should remain on one-on-one observation until deemed no longer necessary by psychiatry. Will hold metformin and place her on Lantus/SSI.  Past Medical History:  Past Medical History  Diagnosis Date  . CAD (coronary artery disease)     a. s/p LHC on 05/2014 admission with non obst dz. Rx medically.   . Insulin dependent diabetes mellitus   . Noncompliance   . Obesity (BMI 30-39.9)   . Hyperlipidemia   . Hypertension   . Cocaine abuse   . Major depressive disorder   . Suicidal ideation   . Abscess of right hand     a. s/p ID on 05/2014 admission     Past Surgical History  Procedure Laterality Date  . Appendectomy    . Abdominal hysterectomy    . Cholecystectomy    . Left heart catheterization with coronary angiogram N/A 05/23/2014    Procedure: LEFT HEART CATHETERIZATION WITH CORONARY ANGIOGRAM;  Surgeon: Runell Gess, MD;  Location: [facility] CATH LAB;  Service: Cardiovascular;  Laterality: N/A;  . I&d extremity Right 05/25/2014    Procedure: IRRIGATION AND DEBRIDEMENT EXTREMITY;  Surgeon: Dairl Ponder, MD;  Location: [facility] OR;  Service: Orthopedics;  Laterality: Right;   Family History: History reviewed. No  pertinent family history. Social History:  History  Alcohol Use No     History  Drug Use  . Yes    Comment: Cocaine    History   Social History  . Marital Status: Divorced    Spouse Name: N/A  . Number of Children: N/A  . Years of Education: N/A   Social History Main Topics  . Smoking status: Current Every Day Smoker -- 1.50 packs/day for 30 years    Types: Cigarettes  . Smokeless tobacco: Never Used  . Alcohol Use: No  . Drug Use: Yes     Comment: Cocaine  . Sexual Activity: Not on file   Other Topics Concern  . None   Social History Narrative   Additional Social History: Patient stated she lives with her boyfriend and her Letta Kocher.         Allergies:   Allergies  Allergen Reactions  . Compazine [Prochlorperazine Edisylate] Other (See Comments)  . Avelox [Moxifloxacin Hcl In Nacl]   . Levaquin [Levofloxacin In D5w]     Vitals: Blood pressure 160/72, pulse 74, temperature 98.2 F (36.8 C), temperature source Oral, resp. rate 20, height  (1.626 m), weight 87.9 kg (193 lb 12.6 oz), SpO2 99 %.  Risk to Self: Is patient at risk for suicide?: Yes Risk to Others:   Prior Inpatient Therapy:   Prior Outpatient Therapy:    Current Facility-Administered Medications  Medication Dose Route Frequency Provider Last Rate Last Dose  . 0.9 %  sodium chloride infusion   Intravenous Continuous Simbiso Ranga, MD 50 mL/hr at  06/02/14 1652    . acetaminophen (TYLENOL) tablet 650 mg  650 mg Oral Q6H PRN Simbiso Ranga, MD       Or  . acetaminophen (TYLENOL) suppository 650 mg  650 mg Rectal Q6H PRN Simbiso Ranga, MD      . albuterol (PROVENTIL) (2.5 MG/3ML) 0.083% nebulizer solution 2.5 mg  2.5 mg Nebulization Q2H PRN Simbiso Ranga, MD      . alum & mag hydroxide-simeth (MAALOX/MYLANTA) 200-200-20 MG/5ML suspension 30 mL  30 mL Oral Q6H PRN Simbiso Ranga, MD      . amitriptyline (ELAVIL) tablet 100 mg  100 mg Oral QHS Simbiso Ranga, MD   100 mg at 06/02/14 2207  . amLODipine  (NORVASC) tablet 5 mg  5 mg Oral Daily Simbiso Ranga, MD   5 mg at 06/03/14 1009  . aspirin EC tablet 81 mg  81 mg Oral Daily Simbiso Ranga, MD   81 mg at 06/03/14 1009  . atorvastatin (LIPITOR) tablet 80 mg  80 mg Oral Daily Simbiso Ranga, MD   80 mg at 06/03/14 1009  . ceFEPIme (MAXIPIME) 1 g in dextrose 5 % 50 mL IVPB  1 g Intravenous 3 times per day Conley CanalSimbiso Ranga, MD   1 g at 06/03/14 16100628  . enoxaparin (LOVENOX) injection 40 mg  40 mg Subcutaneous Q24H Simbiso Ranga, MD   40 mg at 06/02/14 2207  . HYDROcodone-acetaminophen (NORCO/VICODIN) 5-325 MG per tablet 1-2 tablet  1-2 tablet Oral Q4H PRN Conley CanalSimbiso Ranga, MD   2 tablet at 06/03/14 1010  . insulin aspart (novoLOG) injection 0-9 Units  0-9 Units Subcutaneous TID WC Simbiso Ranga, MD   2 Units at 06/03/14 0840  . insulin detemir (LEVEMIR) injection 14 Units  14 Units Subcutaneous QHS Simbiso Ranga, MD   14 Units at 06/02/14 2311  . isosorbide mononitrate (IMDUR) 24 hr tablet 30 mg  30 mg Oral Daily Simbiso Ranga, MD   30 mg at 06/03/14 1009  . lisdexamfetamine (VYVANSE) capsule 40 mg  40 mg Oral q morning - 10a Simbiso Ranga, MD   40 mg at 06/03/14 1010  . lisinopril (PRINIVIL,ZESTRIL) tablet 40 mg  40 mg Oral Daily Simbiso Ranga, MD   40 mg at 06/03/14 1009  . morphine 4 MG/ML injection 4 mg  4 mg Intravenous Q3H PRN Simbiso Ranga, MD   4 mg at 06/03/14 1010  . nicotine (NICODERM CQ - dosed in mg/24 hours) patch 21 mg  21 mg Transdermal Daily Simbiso Ranga, MD   21 mg at 06/03/14 1010  . ondansetron (ZOFRAN) tablet 4 mg  4 mg Oral Q6H PRN Simbiso Ranga, MD       Or  . ondansetron (ZOFRAN) injection 4 mg  4 mg Intravenous Q6H PRN Simbiso Ranga, MD   4 mg at 06/03/14 0747  . pantoprazole (PROTONIX) EC tablet 40 mg  40 mg Oral Daily Simbiso Ranga, MD   40 mg at 06/03/14 1009  . pregabalin (LYRICA) capsule 100 mg  100 mg Oral Daily Simbiso Ranga, MD   100 mg at 06/03/14 1010  . senna-docusate (Senokot-S) tablet 1 tablet  1 tablet Oral QHS PRN  Simbiso Ranga, MD      . sodium chloride 0.9 % injection 10-40 mL  10-40 mL Intracatheter PRN Simbiso Ranga, MD      . vancomycin (VANCOCIN) 1,250 mg in sodium chloride 0.9 % 250 mL IVPB  1,250 mg Intravenous Q12H Simbiso Ranga, MD   1,250 mg at 06/03/14 1019  . zolpidem (  AMBIEN) tablet 5 mg  5 mg Oral QHS PRN Simbiso Ranga, MD   5 mg at 06/02/14 2209    Musculoskeletal: Strength & Muscle Tone: within normal limits Gait & Station: normal Patient leans: N/A  Psychiatric Specialty Exam: Physical Exam as per history and physical   ROS hand pain secondary to infection   Blood pressure 160/72, pulse 74, temperature 98.2 F (36.8 C), temperature source Oral, resp. rate 20, height 5\' 4"  (1.626 m), weight 87.9 kg (193 lb 12.6 oz), SpO2 99 %.Body mass index is 33.25 kg/(m^2).  General Appearance: Casual  Eye Contact::  Good  Speech:  Clear and Coherent  Volume:  Normal  Mood:  Euthymic  Affect:  Appropriate and Congruent  Thought Process:  Coherent and Goal Directed  Orientation:  Full (Time, Place, and Person)  Thought Content:  WDL  Suicidal Thoughts:  No  Homicidal Thoughts:  No  Memory:  Immediate;   Good Recent;   Good  Judgement:  Intact  Insight:  Good  Psychomotor Activity:  Normal  Concentration:  Good  Recall:  Good  Fund of Knowledge:Good  Language: Good  Akathisia:  Negative  Handed:  Right  AIMS (if indicated):     Assets:  Communication Skills Desire for Improvement Financial Resources/Insurance Housing Intimacy Leisure Time Resilience Social Support Talents/Skills  ADL's:  Intact  Cognition: WNL  Sleep:      Medical Decision Making: New problem, with additional work up planned, Review of Psycho-Social Stressors (1), Review or order clinical lab tests (1), Established Problem, Worsening (2), Review or order medicine tests (1), Review of Medication Regimen & Side Effects (2) and Review of New Medication or Change in Dosage (2)  Treatment Plan Summary: Daily  contact with patient to assess and evaluate symptoms and progress in treatment and Medication management  Plan:  Continue current medication management without additional psychotropics Keep off her xanax and vyvanse until further proper assessment for these medication needs May refer to substance abuse rehab treatment when needed Patient does not meet criteria for psychiatric inpatient admission. Supportive therapy provided about ongoing stressors. Appreciate psychiatric consultation and follow up as clinically required Please contact [phone] or [phone] if needs further assistance  Disposition: Patient does not meet criteria for acute psychiatric hospitalization at this time and patient will be referred to the outpatient psychiatric medication management upon medically cleared.  Shirley Short,JANARDHAHA R. 06/03/2014 10:28 AM

## 2014-06-03 NOTE — Telephone Encounter (Signed)
According to the pts chart she has been re [facility] due to pain caused by a nosocomial hand infection on IV site that developed after her heart cath that was done on 2/29.

## 2014-06-03 NOTE — Progress Notes (Signed)
Clinical Social Work  Patient was discussed during progression meeting and RN reports that patient made statements about not having permanent housing and "living in between family members." CSW rounded with psych MD in order to assess patient. Patient reports she plans on DC home and will return back with stepfather. Patient reports she usually lives at home with boyfriend and stepfather stays with her but she needs to be out of her environment so she does not use any substances.  Patient more open to discussing substance use today. Patient reports about 1 month ago she wanted to stop using cocaine on a daily basis because her grandson was about to be born. Patient reports she reduced her consumption but became upset and went on a binge. Patient reports that binge created problems and she had to be hospitalized. Patient reports that she knows she uses more substances when she is upset and agreeable to consider treatment options. Patient denies any SI or HI and plans on to continue to follow up with her psychiatrist on an outpatient basis. Patient reports she has been to SA treatment in [place] before and would consider treatment there but would want to know resources available to her. CSW called several agencies but only agency agreeable to accept patient's Medicaid was [facility]. [facility] has a walk in clinic Monday-Friday. All information provided to patient and placed on AVS.   Patient more engaged in assessment today and reports that stepfather holds her accountable to ensure she can remain sober. Patient is hopeful to get better so she can go to [place] to visit her grandson. CSW will continue to follow patient throughout hospitalization in order to provide support.  Camrose ColonyHolly Chang Tig[place], Kentucky[place]CSW [phone]

## 2014-06-03 NOTE — Progress Notes (Signed)
Patient awake and quite alert. I offered support and chatted with her briefly. She denied needing any support at this time. I educated her about chaplain services and how to contact a chaplain if she later wanted supported.  Donnelly StagerAlexis Retta Pitcher, PhD, [facility] Chaplain

## 2014-06-03 NOTE — Progress Notes (Signed)
PHARMACY - VANCOMYCIN (brief note)  Day #3 Vancomycin and Cefepime for abscess and cellulitis of the right hand  Vancomycin trough = 14.3 mcg/ml (Goal 10-2815mcg/ml) on regimen of Vancomycin 1250mg  IV q12h  Assessment:  Vancomycin level within desired range  Plan:  Continue Vancomycin 1250mg  IV q12h           Continue Cefepime 1gm IV q8h  Terrilee FilesLeann Isrrael Fluckiger, PharmD

## 2014-06-03 NOTE — Progress Notes (Signed)
ANTIBIOTIC CONSULT NOTE - follow up  Pharmacy Consult for Vancomycin / Cefepime Indication: Wound infection / Cellulitis  Allergies  Allergen Reactions  . Compazine [Prochlorperazine Edisylate] Other (See Comments)  . Avelox [Moxifloxacin Hcl In Nacl]   . Levaquin [Levofloxacin In D5w]     Patient Measurements: Height: 5\' 4"  (162.6 cm) Weight: 193 lb 12.6 oz (87.9 kg) IBW/kg (Calculated) : 54.7 Adjusted Body Weight:   Vital Signs: Temp: 98.2 F (36.8 C) (03/11 0632) Temp Source: Oral (03/11 40980632) BP: 160/72 mmHg (03/11 11910632) Pulse Rate: 74 (03/11 0632) Intake/Output from previous day: 03/10 0701 - 03/11 0700 In: 480 [P.O.:480] Out: 1150 [Urine:1150] Intake/Output from this shift:    Labs:  Recent Labs  06/02/14 0520 06/03/14 0450  WBC 8.4 10.9*  HGB 11.6* 11.8*  PLT 230 259  CREATININE 0.74 0.63   Estimated Creatinine Clearance: 87.3 mL/min (by C-G formula based on Cr of 0.63). No results for input(s): VANCOTROUGH, VANCOPEAK, VANCORANDOM, GENTTROUGH, GENTPEAK, GENTRANDOM, TOBRATROUGH, TOBRAPEAK, TOBRARND, AMIKACINPEAK, AMIKACINTROU, AMIKACIN in the last 72 hours.   Microbiology: Recent Results (from the past 720 hour(s))  MRSA PCR Screening     Status: None   Collection Time: 05/23/14  4:58 AM  Result Value Ref Range Status   MRSA by PCR NEGATIVE NEGATIVE Final    Comment:        The GeneXpert MRSA Assay (FDA approved for NASAL specimens only), is one component of a comprehensive MRSA colonization surveillance program. It is not intended to diagnose MRSA infection nor to guide or monitor treatment for MRSA infections.   Anaerobic culture     Status: None   Collection Time: 05/25/14  2:50 PM  Result Value Ref Range Status   Specimen Description WOUND HAND RIGHT  Final   Special Requests NONE  Final   Gram Stain   Final    NO SQUAMOUS EPITHELIAL CELLS SEEN NO ORGANISMS SEEN DEGENERATED CELLULAR MATERIAL PRESENT Performed at [facility] Performed at [facility]    Culture   Final    NO ANAEROBES ISOLATED Performed at [facility]    Report Status 05/30/2014 FINAL  Final  Gram stain     Status: None   Collection Time: 05/25/14  2:50 PM  Result Value Ref Range Status   Specimen Description WOUND HAND RIGHT  Final   Special Requests NONE  Final   Gram Stain   Final    DEGENERATED CELLULAR MATERIAL PRESENT NO ORGANISMS SEEN    Report Status 05/25/2014 FINAL  Final  Wound culture     Status: None   Collection Time: 05/25/14  2:50 PM  Result Value Ref Range Status   Specimen Description WOUND HAND RIGHT  Final   Special Requests NONE  Final   Gram Stain   Final    NO WBC SEEN NO SQUAMOUS EPITHELIAL CELLS SEEN NO ORGANISMS SEEN DEGENERATED CELLULAR MATERIAL PRESENT Performed at [facility] Performed at [facility]    Culture   Final    MODERATE STAPHYLOCOCCUS AUREUS Note: RIFAMPIN AND GENTAMICIN SHOULD NOT BE USED AS SINGLE DRUGS FOR TREATMENT OF STAPH INFECTIONS. Performed at [facility]    Report Status 05/28/2014 FINAL  Final   Organism ID, Bacteria STAPHYLOCOCCUS AUREUS  Final      Susceptibility   Staphylococcus aureus - MIC*    CLINDAMYCIN <=0.25 SENSITIVE Sensitive     ERYTHROMYCIN <=0.25 SENSITIVE Sensitive     GENTAMICIN <=0.5 SENSITIVE Sensitive     LEVOFLOXACIN  0.25 SENSITIVE Sensitive     OXACILLIN <=0.25 SENSITIVE Sensitive     PENICILLIN >=0.5 RESISTANT Resistant     RIFAMPIN <=0.5 SENSITIVE Sensitive     TRIMETH/SULFA <=10 SENSITIVE Sensitive     VANCOMYCIN <=0.5 SENSITIVE Sensitive     TETRACYCLINE <=1 SENSITIVE Sensitive     MOXIFLOXACIN <=0.25 SENSITIVE Sensitive     * MODERATE STAPHYLOCOCCUS AUREUS  AFB culture with smear     Status: None (Preliminary result)   Collection Time: 05/25/14  2:50 PM  Result Value Ref Range Status   Specimen Description WOUND HAND RIGHT  Final   Special Requests NONE  Final   Acid Fast Smear   Final     NO ACID FAST BACILLI SEEN Performed at [facility]    Culture   Final    CULTURE WILL BE EXAMINED FOR 6 WEEKS BEFORE ISSUING A FINAL REPORT Performed at [facility]    Report Status PENDING  Incomplete    Medical History: Past Medical History  Diagnosis Date  . CAD (coronary artery disease)     a. s/p LHC on 05/2014 admission with non obst dz. Rx medically.   . Insulin dependent diabetes mellitus   . Noncompliance   . Obesity (BMI 30-39.9)   . Hyperlipidemia   . Hypertension   . Cocaine abuse   . Major depressive disorder   . Suicidal ideation   . Abscess of right hand     a. s/p ID on 05/2014 admission     Assessment: 69 yoF with morbid obesity, T2DM, chronic pain, CAD, HTN, COPD among other medical problems presents with worsening cellulitis and abscess of the right hand s/p I/D on 3/2 with cultures growing MSSA and treated with doxycycline.  Pharmacy consulted to start IV vancomycin and cefepime.     3/9 >> Vancomycin  >> 3/9 >> Cefepime >>    Tmax: 98.2 WBCs: WNL Renal: SCr 0.70, CrCl 87 CG (N >100)  Significant Events: 3/10 PICC line place due to difficult IV access  Goal of Therapy:  Vancomycin trough goal 10-15  Plan:  Continue cefepime 1g IV q8h Continue Vancomycin 1250g IV q12h Check trough tonight before the 5th dose F/u renal function, clinical course  Arley Phenix RPh 06/03/2014, 10:34 AM Pager [phone]

## 2014-06-04 LAB — GLUCOSE, CAPILLARY
GLUCOSE-CAPILLARY: 209 mg/dL — AB (ref 70–99)
GLUCOSE-CAPILLARY: 224 mg/dL — AB (ref 70–99)

## 2014-06-04 MED ORDER — HYDROCODONE-ACETAMINOPHEN 5-325 MG PO TABS: 1.0000 | ORAL_TABLET | ORAL | Status: DC | PRN

## 2014-06-04 MED ORDER — DOXYCYCLINE HYCLATE 100 MG PO TABS: 100.0000 mg | ORAL_TABLET | Freq: Two times a day (BID) | ORAL | Status: DC

## 2014-06-04 MED ORDER — INSULIN DETEMIR 100 UNIT/ML ~~LOC~~ SOLN: 20.0000 [IU] | Freq: Every day | SUBCUTANEOUS | Status: DC

## 2014-06-04 MED ORDER — HYDRALAZINE HCL 20 MG/ML IJ SOLN
10.0000 mg | Freq: Four times a day (QID) | INTRAMUSCULAR | Status: DC | PRN
  Administered 2014-06-04: 10 mg via INTRAVENOUS
  Filled 2014-06-04: qty 1

## 2014-06-04 MED ORDER — SENNOSIDES-DOCUSATE SODIUM 8.6-50 MG PO TABS: 1.0000 | ORAL_TABLET | Freq: Every evening | ORAL | Status: DC | PRN

## 2014-06-04 NOTE — Clinical Social Work Note (Signed)
CSW received a call from RN stating that pt needed transportation back to [place] and was discharging today  CSW called medicaid transportation in [place] and set up for pt to be picked up and brought back home  Pt very appreciative of CSW finding her a ride home today  No further CSW needs  .Shirley Bubaegina Lauranne Beyersdorf, LCSW [facility] Clinical Social Worker - Weekend Coverage cell #: [phone]

## 2014-06-04 NOTE — Discharge Summary (Signed)
Shirley Short L Macioce, is a 54 y.o. female  DOB 07/26/1960  MRN [id number].  Admission date:  06/01/2014  Admitting Physician  Shirley CanalSimbiso Elizah Lydon, MD  Discharge Date:  06/04/2014   Primary MD  No PCP Per Patient  Recommendations for primary care physician for things to follow:  Please follow hemoglobin A1c to optimize diabetes control Refer to psychiatry Refer to general surgery as necessary   Admission Diagnosis   cellulitis   Discharge Diagnosis  cellulitis   Principal Problem:   MDD (major depressive disorder), recurrent, severe, with psychosis Active Problems:   Abscess of right hand   Cocaine abuse   Chronic pain disorder   DM2 (diabetes mellitus, type 2)   Obesity (BMI 30-39.9)   COPD (chronic obstructive pulmonary disease)   Tobacco abuse   Hypertension   Hyperlipidemia   CAD (coronary artery disease)      Hospital Course  Shirley Short is a pleasant 54 year old female with multiple medical problems including essential hypertension/hyperlipidemia/CAD being medically treated/COPD/tobacco dependency/morbid obesity BMI 30-39.9/diabetes mellitus type 2 HbA1C 12.2, chronic pain disorder, cocaine abuse, among other medical problems who was [facility](where she was admitted for Major Depression with suicidal ideation-resolved), after consultation for worsening cellulitis of the right hand s/p incision and drainage a few days ago by Dr Mina MarbleWeingold with wound cultures growing staph aureus. She was noted to have worsening swelling of the hand with increasing pain. She had been placed on Doxycycline after incision and [facility] at [facility] where she was admitted to the cardiology service as [facility] suspected acute myocardial infarction in setting of active cocaine use, LBBB on EKG. She had cardiac  catheterization with finding of "...coronary vasospasm and distal LAD and RCA disease. Medical therapy was recommended...'. Apparently, cellulitis had worsened since transfer therefore reason for consultation. At the time of consultation at [facility] it was apparent that patient needed IV antibiotics therefore transferred to the medical service. Her hand improved with less inflammation and drainage. There was concern that she had local reaction to antibiotics but I think this was due to local infiltration. She had difficult IV access and therefore a PICC line was placed. She tolerated Vancomycin/cefepime after PICC line placement. She will be discharged on doxycycline and she should follow wound care recommendations regarding dressing changes. She should follow with orthopedics as scheduled. Psychiatry recommended for had to stop Xanax/Vyvance pending outpatient review. Her sugars are generally poorly controlled areas evidenced by hemoglobin A1c of 12.2. Patient tells me that she is on Lantus and sliding scale insulin but apparently she has not worked with a primary care provider to optimize control. I have discharged her on 20 units daily at bedtime of Lantus and she should continue sliding-scale insulin/metformin and follow with her primary care provider to optimize control.   Discharge Condition Stable.  Consults obtained  Psychiatry  Follow UP Follow-up Information    Follow up with [facility].   Why:  Walk-in clinic open Monday-Friday 8:30am-3pm. Please bring ID, Social Security  card, Medicaid card and proof of income for intake for substance abuse resources   Contact information:   [address] [place], [place]  [phone]        Discharge Instructions  and  Discharge Medications  Discharge Instructions    Diet - low sodium heart healthy    Complete by:  As directed      Diet Carb Modified    Complete by:  As directed      Discharge wound care:    Complete by:   As directed   Dressing changes per wound care     Increase activity slowly    Complete by:  As directed             Medication List    STOP taking these medications        ALPRAZolam 1 MG tablet  Commonly known as:  XANAX     VYVANSE 40 MG capsule  Generic drug:  lisdexamfetamine      TAKE these medications        amitriptyline 100 MG tablet  Commonly known as:  ELAVIL  Take 1 tablet by mouth at bedtime.     amLODipine 5 MG tablet  Commonly known as:  NORVASC  Take 1 tablet (5 mg total) by mouth daily.     aspirin 81 MG EC tablet  Take 1 tablet (81 mg total) by mouth daily.     atorvastatin 80 MG tablet  Commonly known as:  LIPITOR  Take 1 tablet (80 mg total) by mouth daily at 6 PM.     doxycycline 100 MG tablet  Commonly known as:  VIBRA-TABS  Take 1 tablet (100 mg total) by mouth every 12 (twelve) hours.     gabapentin 300 MG capsule  Commonly known as:  NEURONTIN  Take 2 capsules (600 mg total) by mouth 3 (three) times daily.     HYDROcodone-acetaminophen 5-325 MG per tablet  Commonly known as:  NORCO/VICODIN  Take 1-2 tablets by mouth every 4 (four) hours as needed for moderate pain.     insulin detemir 100 UNIT/ML injection  Commonly known as:  LEVEMIR  Inject 0.2 mLs (20 Units total) into the skin at bedtime.     isosorbide mononitrate 30 MG 24 hr tablet  Commonly known as:  IMDUR  Take 1 tablet (30 mg total) by mouth daily.     lansoprazole 30 MG capsule  Commonly known as:  PREVACID  Take 30 mg by mouth daily.     lisinopril 40 MG tablet  Commonly known as:  PRINIVIL,ZESTRIL  Take 1 tablet (40 mg total) by mouth daily.     metFORMIN 500 MG tablet  Commonly known as:  GLUCOPHAGE  Take 500 mg by mouth 2 (two) times daily with a meal.     nicotine 21 mg/24hr patch  Commonly known as:  NICODERM CQ - dosed in mg/24 hours  Place 1 patch (21 mg total) onto the skin daily.     pregabalin 300 MG capsule  Commonly known as:  LYRICA  Take 300  mg by mouth 3 (three) times daily.     senna-docusate 8.6-50 MG per tablet  Commonly known as:  Senokot-S  Take 1 tablet by mouth at bedtime as needed for mild constipation.        Diet and Activity recommendation: See Discharge Instructions above  Major procedures and Radiology Reports - PLEASE review detailed and final reports for all details, in brief -  Portable Chest 1 View  06/01/2014   CLINICAL DATA:  History of smoking, recent myocardial infarction, hypertension and CHF.  EXAM: PORTABLE CHEST - 1 VIEW  COMPARISON:  05/23/2014; 02/15/2013  FINDINGS: Grossly unchanged cardiac silhouette and mediastinal contours given persistently reduced lung volumes. No focal airspace opacities. No pleural effusion or pneumothorax. No evidence of edema. No acute osseus abnormalities.  IMPRESSION: No definite acute cardiopulmonary disease on this AP portable examination.   Electronically Signed   By: Simonne Come M.D.   On: 06/01/2014 20:09    Micro Results   Recent Results (from the past 240 hour(s))  Anaerobic culture     Status: None   Collection Time: 05/25/14  2:50 PM  Result Value Ref Range Status   Specimen Description WOUND HAND RIGHT  Final   Special Requests NONE  Final   Gram Stain   Final    NO SQUAMOUS EPITHELIAL CELLS SEEN NO ORGANISMS SEEN DEGENERATED CELLULAR MATERIAL PRESENT Performed at [facility] Performed at [facility]    Culture   Final    NO ANAEROBES ISOLATED Performed at [facility]    Report Status 05/30/2014 FINAL  Final  Gram stain     Status: None   Collection Time: 05/25/14  2:50 PM  Result Value Ref Range Status   Specimen Description WOUND HAND RIGHT  Final   Special Requests NONE  Final   Gram Stain   Final    DEGENERATED CELLULAR MATERIAL PRESENT NO ORGANISMS SEEN    Report Status 05/25/2014 FINAL  Final  Wound culture     Status: None   Collection Time: 05/25/14  2:50 PM  Result Value Ref Range Status   Specimen  Description WOUND HAND RIGHT  Final   Special Requests NONE  Final   Gram Stain   Final    NO WBC SEEN NO SQUAMOUS EPITHELIAL CELLS SEEN NO ORGANISMS SEEN DEGENERATED CELLULAR MATERIAL PRESENT Performed at [facility] Performed at [facility]    Culture   Final    MODERATE STAPHYLOCOCCUS AUREUS Note: RIFAMPIN AND GENTAMICIN SHOULD NOT BE USED AS SINGLE DRUGS FOR TREATMENT OF STAPH INFECTIONS. Performed at [facility]    Report Status 05/28/2014 FINAL  Final   Organism ID, Bacteria STAPHYLOCOCCUS AUREUS  Final      Susceptibility   Staphylococcus aureus - MIC*    CLINDAMYCIN <=0.25 SENSITIVE Sensitive     ERYTHROMYCIN <=0.25 SENSITIVE Sensitive     GENTAMICIN <=0.5 SENSITIVE Sensitive     LEVOFLOXACIN 0.25 SENSITIVE Sensitive     OXACILLIN <=0.25 SENSITIVE Sensitive     PENICILLIN >=0.5 RESISTANT Resistant     RIFAMPIN <=0.5 SENSITIVE Sensitive     TRIMETH/SULFA <=10 SENSITIVE Sensitive     VANCOMYCIN <=0.5 SENSITIVE Sensitive     TETRACYCLINE <=1 SENSITIVE Sensitive     MOXIFLOXACIN <=0.25 SENSITIVE Sensitive     * MODERATE STAPHYLOCOCCUS AUREUS  AFB culture with smear     Status: None (Preliminary result)   Collection Time: 05/25/14  2:50 PM  Result Value Ref Range Status   Specimen Description WOUND HAND RIGHT  Final   Special Requests NONE  Final   Acid Fast Smear   Final    NO ACID FAST BACILLI SEEN Performed at [facility]    Culture   Final    CULTURE WILL BE EXAMINED FOR 6 WEEKS BEFORE ISSUING A FINAL REPORT Performed at [facility]    Report Status PENDING  Incomplete  Urine culture     Status: None   Collection Time: 06/02/14  5:41 AM  Result Value Ref Range Status   Specimen Description URINE, CLEAN CATCH  Final   Special Requests NONE  Final   Colony Count   Final    3,000 COLONIES/ML Performed at [facility]    Culture   Final    INSIGNIFICANT GROWTH Performed at [facility]    Report  Status 06/03/2014 FINAL  Final       Today   Subjective:   Shirley Short today has no headache,no chest abdominal pain,no new weakness tingling or numbness, feels much better wants to go home today.   Objective:   Blood pressure 156/72, pulse 97, temperature 98.2 F (36.8 C), temperature source Oral, resp. rate 20, height  (1.626 m), weight 87.9 kg (193 lb 12.6 oz), SpO2 98 %.   Intake/Output Summary (Last 24 hours) at 06/04/14 1100 Last data filed at 06/03/14 2218  Gross per 24 hour  Intake     10 ml  Output      0 ml  Net     10 ml    Exam Awake Alert, Oriented x 3, No new F.N deficits, Normal affect [place].AT,PERRAL Supple Neck,No JVD, No cervical lymphadenopathy appriciated.  Symmetrical Chest wall movement, Good air movement bilaterally, CTAB RRR,No Gallops,Rubs or new Murmurs, No Parasternal Heave +ve B.Sounds, Abd Soft, Non tender, No organomegaly appriciated, No rebound -guarding or rigidity. No Cyanosis, Clubbing or edema, No new Rash or bruise. No drainage from right hand. Slight erythema around the wound, much better than at time of admission.  Data Review   CBC w Diff: Lab Results  Component Value Date   WBC 10.9* 06/03/2014   HGB 11.8* 06/03/2014   HCT 37.1 06/03/2014   PLT 259 06/03/2014   LYMPHOPCT 21 05/26/2014   MONOPCT 6 05/26/2014   EOSPCT 2 05/26/2014   BASOPCT 0 05/26/2014    CMP: Lab Results  Component Value Date   NA 135 06/03/2014   K 4.6 06/03/2014   CL 99 06/03/2014   CO2 29 06/03/2014   BUN 14 06/03/2014   CREATININE 0.63 06/03/2014   PROT 6.1 06/02/2014   ALBUMIN 2.8* 06/02/2014   BILITOT 0.2* 06/02/2014   ALKPHOS 112 06/02/2014   AST 39* 06/02/2014   ALT 25 06/02/2014  .   Total Time in preparing paper work, data evaluation and todays exam - 35 minutes  Janetta Vandoren M.D on 06/04/2014 at 11:00 AM  [facility] Office  [phone]

## 2014-06-14 ENCOUNTER — Encounter: Payer: Self-pay | Admitting: Physician Assistant

## 2014-06-16 NOTE — Discharge Summary (Signed)
Physician Discharge Summary Note  Patient:  Shirley SimmondsDonna L Barefield is an 54 y.o., female MRN:  [id number] DOB:  03/03/1961 Patient phone:  [phone] (home)  Patient address:   [address] [place] [id number],  Total Time spent with patient: 30 minutes  Date of Admission:  05/31/2014 Date of Discharge: 06/16/2014  Reason for Admission:  depression  Principal Problem: MDD (major depressive disorder) Discharge Diagnoses: Patient Active Problem List   Diagnosis Date Noted  . Chronic pain disorder [G89.4] 05/31/2014  . MDD (major depressive disorder) [F32.2] 05/31/2014  . Abscess of right hand [L02.511]   . CAD (coronary artery disease) [I25.10]   . MDD (major depressive disorder), recurrent, severe, with psychosis [F33.3] 05/29/2014  . Cocaine abuse [F14.10] 05/23/2014  . CAD (coronary artery disease), native coronary artery [I25.10] 05/23/2014  . Obesity (BMI 30-39.9) [E66.9] 05/23/2014  . COPD (chronic obstructive pulmonary disease) [J44.9] 05/23/2014  . Hyperlipidemia [E78.5] 05/23/2014  . DM2 (diabetes mellitus, type 2) [E11.9]   . Noncompliance [Z91.19]   . Tobacco abuse [Z72.0]   . Hypertension [I10]     Musculoskeletal: Strength & Muscle Tone: within normal limits Gait & Station: normal Patient leans: N/A  Psychiatric Specialty Exam:  SEE SRA Physical Exam  Vitals reviewed. Skin:     Patient had an infiltrate IV, and became infected.  I&D was done.  The hand remains edematous and warm and red.    Psychiatric: She has a normal mood and affect.    Review of Systems  Skin:       See note on skin exam   All other systems reviewed and are negative.   Blood pressure 140/70, pulse 75, temperature 98 F (36.7 C), temperature source Oral, resp. rate 18, height 5\' 4"  (1.626 m), weight 87.998 kg (194 lb), SpO2 96 %.Body mass index is 33.28 kg/(m^2).   Past Medical History:  Past Medical History  Diagnosis Date  . CAD (coronary artery disease)     a. s/p LHC  on 05/2014 admission with non obst dz. Rx medically.   . Insulin dependent diabetes mellitus   . Noncompliance   . Obesity (BMI 30-39.9)   . Hyperlipidemia   . Hypertension   . Cocaine abuse   . Major depressive disorder   . Suicidal ideation   . Abscess of right hand     a. s/p ID on 05/2014 admission     Past Surgical History  Procedure Laterality Date  . Appendectomy    . Abdominal hysterectomy    . Cholecystectomy    . Left heart catheterization with coronary angiogram N/A 05/23/2014    Procedure: LEFT HEART CATHETERIZATION WITH CORONARY ANGIOGRAM;  Surgeon: Runell GessJonathan J Berry, MD;  [facility];  Service: Cardiovascular;  Laterality: N/A;  . I&d extremity Right 05/25/2014    Procedure: IRRIGATION AND DEBRIDEMENT EXTREMITY;  Surgeon: Dairl PonderMatthew Weingold, MD;  Location: [facility] OR;  Service: Orthopedics;  Laterality: Right;   Family History: History reviewed. No pertinent family history. Social History:  History  Alcohol Use No     History  Drug Use  . Yes    Comment: Cocaine    History   Social History  . Marital Status: Divorced    Spouse Name: N/A  . Number of Children: N/A  . Years of Education: N/A   Social History Main Topics  . Smoking status: Current Every Day Smoker -- 1.50 packs/day for 30 years    Types: Cigarettes  . Smokeless tobacco: Never  Used  . Alcohol Use: No  . Drug Use: Yes     Comment: Cocaine  . Sexual Activity: Not on file   Other Topics Concern  . None   Social History Narrative   Risk to Self: Is patient at risk for suicide?: Yes What has been your use of drugs/alcohol within the last 12 months?: cocaine-once in awhile Risk to Others:   Prior Inpatient Therapy:   Prior Outpatient Therapy:    Level of Care:  OP  Hospital Course:  Patient is a 54 year old female, who presented to hospital With chest pain, occuring after a period of regular cocaine use. Was worked up with cardiac catheterization and found to have coronary artery  disease and vasospasm. She was treated medically.During her medical admission reported depression and suicidal ideation. Of note, patient developed a nosocomial hand infection on IV site, which required debridement. At this time her major focus is on this issue, and describes significant pain and discomfort. She allowed [place] to examine wound, which is open/ packed with drainage. Patient reports she is depressed, but denies any suicidal ideations at present. She also denies any hallucinations. She does not appear to Be internally preoccupied.  She does report some symptoms of depression as noted below.  Shirley Short was admitted for MDD (major depressive disorder) and crisis management.  She was treated discharged with the medications listed below under Medication List.  Medical problems were identified and treated as needed.  Home medications were restarted as appropriate.  Improvement was monitored by observation and Shirley Short daily report of symptom reduction.  Emotional and mental status was monitored by daily self-inventory reports completed by Shirley Short and clinical staff.         Shirley Short was evaluated by the treatment team for stability and plans for continued recovery upon discharge.  Shirley Short motivation was an integral factor for scheduling further treatment.  Employment, transportation, bed availability, health status, family support, and any pending legal issues were also considered during her hospital stay.  She was offered further treatment options upon discharge including but not limited to Residential, Intensive Outpatient, and Outpatient treatment.  Shirley Short will follow up with the services as listed below under Follow Up Information.     Upon completion of this admission the patient was both mentally and medically stable for discharge denying suicidal/homicidal ideation, auditory/visual/tactile hallucinations, delusional thoughts and paranoia.       Consults:  psychiatry  Significant Diagnostic Studies:  labs: per ED  Discharge Vitals:   Blood pressure 140/70, pulse 75, temperature 98 F (36.7 C), temperature source Oral, resp. rate 18, height  (1.626 m), weight 87.998 kg (194 lb), SpO2 96 %. Body mass index is 33.28 kg/(m^2). Lab Results:   No results found for this or any previous visit (from the past 72 hour(s)).  Physical Findings: AIMS: Facial and Oral Movements Muscles of Facial Expression: None, normal Lips and Perioral Area: None, normal Jaw: None, normal Tongue: None, normal,Extremity Movements Upper (arms, wrists, hands, fingers): None, normal Lower (legs, knees, ankles, toes): None, normal, Trunk Movements Neck, shoulders, hips: None, normal, Overall Severity Severity of abnormal movements (highest score from questions above): None, normal Incapacitation due to abnormal movements: None, normal Patient's awareness of abnormal movements (rate only patient's report): No Awareness, Dental Status Current problems with teeth and/or dentures?: Yes Does patient usually wear dentures?: No  CIWA:    COWS:      See  Psychiatric Specialty Exam and Suicide Risk Assessment completed by Attending Physician prior to discharge.  Discharge destination:  Home  Is patient on multiple antipsychotic therapies at discharge:  No   Has Patient had three or more failed trials of antipsychotic monotherapy by history:  No    Recommended Plan for Multiple Antipsychotic Therapies: NA     Medication List    ASK your doctor about these medications      Indication   amitriptyline 100 MG tablet  Commonly known as:  ELAVIL  Take 1 tablet by mouth at bedtime.      amLODipine 5 MG tablet  Commonly known as:  NORVASC  Take 1 tablet (5 mg total) by mouth daily.      aspirin 81 MG EC tablet  Take 1 tablet (81 mg total) by mouth daily.      atorvastatin 80 MG tablet  Commonly known as:  LIPITOR  Take 1 tablet (80 mg total)  by mouth daily at 6 PM.      gabapentin 300 MG capsule  Commonly known as:  NEURONTIN  Take 2 capsules (600 mg total) by mouth 3 (three) times daily.      isosorbide mononitrate 30 MG 24 hr tablet  Commonly known as:  IMDUR  Take 1 tablet (30 mg total) by mouth daily.      lansoprazole 30 MG capsule  Commonly known as:  PREVACID  Take 30 mg by mouth daily.      lisinopril 40 MG tablet  Commonly known as:  PRINIVIL,ZESTRIL  Take 1 tablet (40 mg total) by mouth daily.      metFORMIN 500 MG tablet  Commonly known as:  GLUCOPHAGE  Take 500 mg by mouth 2 (two) times daily with a meal.      nicotine 21 mg/24hr patch  Commonly known as:  NICODERM CQ - dosed in mg/24 hours  Place 1 patch (21 mg total) onto the skin daily.          Follow-up recommendations:  Activity:  as tol, diet as tol  Comments:  1.  Take all your medications as prescribed.              2.  Report any adverse side effects to outpatient provider.                       3.  Patient instructed to not use alcohol or illegal drugs while on prescription medicines.            4.  In the event of worsening symptoms, instructed patient to call 911, the crisis hotline or go to nearest emergency room for evaluation of symptoms.  Total Discharge Time:  30 min  Signed: Adonis Brook MAY, AGNP-BC 06/16/2014, 6:41 PM    I have reviewed  Discharge Summary as above . Patient was seen by Hospitalist consultant due to hand wound /cellulitis. It was deemed that this infection was worsening and required inpatient medical treatment , so patient was transferred out of [facility] to [facility] Medical unit for ongoing medical care of her wound. Psychiatry consultant to follow patient up during her medical treatment at [facility].  Nehemiah Massed, MD  Nehemiah Massed, MD

## 2014-07-07 ENCOUNTER — Inpatient Hospital Stay
Admission: AD | Admit: 2014-07-07 | Payer: Medicaid - Out of State | Source: Other Acute Inpatient Hospital | Admitting: Cardiology

## 2014-07-07 ENCOUNTER — Inpatient Hospital Stay (HOSPITAL_COMMUNITY)
Admission: AD | Admit: 2014-07-07 | Discharge: 2014-07-12 | DRG: 303 | Disposition: A | Discharge status: Other Acute Inpatient Hospital | Payer: Medicaid - Out of State | Source: Other Acute Inpatient Hospital | Attending: Cardiology | Admitting: Cardiology

## 2014-07-07 DIAGNOSIS — I447 Left bundle-branch block, unspecified: Secondary | ICD-10-CM | POA: Diagnosis present

## 2014-07-07 DIAGNOSIS — E119 Type 2 diabetes mellitus without complications: Secondary | ICD-10-CM

## 2014-07-07 DIAGNOSIS — F1414 Cocaine abuse with cocaine-induced mood disorder: Secondary | ICD-10-CM | POA: Diagnosis not present

## 2014-07-07 DIAGNOSIS — M797 Fibromyalgia: Secondary | ICD-10-CM | POA: Diagnosis not present

## 2014-07-07 DIAGNOSIS — Z9119 Patient's noncompliance with other medical treatment and regimen: Secondary | ICD-10-CM | POA: Diagnosis not present

## 2014-07-07 DIAGNOSIS — I2510 Atherosclerotic heart disease of native coronary artery without angina pectoris: Secondary | ICD-10-CM | POA: Diagnosis present

## 2014-07-07 DIAGNOSIS — E669 Obesity, unspecified: Secondary | ICD-10-CM | POA: Diagnosis not present

## 2014-07-07 DIAGNOSIS — F17210 Nicotine dependence, cigarettes, uncomplicated: Secondary | ICD-10-CM | POA: Diagnosis present

## 2014-07-07 DIAGNOSIS — Z6831 Body mass index (BMI) 31.0-31.9, adult: Secondary | ICD-10-CM | POA: Diagnosis not present

## 2014-07-07 DIAGNOSIS — Z7982 Long term (current) use of aspirin: Secondary | ICD-10-CM

## 2014-07-07 DIAGNOSIS — R079 Chest pain, unspecified: Secondary | ICD-10-CM | POA: Diagnosis present

## 2014-07-07 DIAGNOSIS — Z720 Tobacco use: Secondary | ICD-10-CM | POA: Diagnosis present

## 2014-07-07 DIAGNOSIS — F329 Major depressive disorder, single episode, unspecified: Secondary | ICD-10-CM | POA: Diagnosis present

## 2014-07-07 DIAGNOSIS — I252 Old myocardial infarction: Secondary | ICD-10-CM

## 2014-07-07 DIAGNOSIS — E785 Hyperlipidemia, unspecified: Secondary | ICD-10-CM | POA: Diagnosis not present

## 2014-07-07 DIAGNOSIS — F1994 Other psychoactive substance use, unspecified with psychoactive substance-induced mood disorder: Secondary | ICD-10-CM | POA: Diagnosis not present

## 2014-07-07 DIAGNOSIS — I10 Essential (primary) hypertension: Secondary | ICD-10-CM | POA: Diagnosis not present

## 2014-07-07 DIAGNOSIS — F1110 Opioid abuse, uncomplicated: Secondary | ICD-10-CM | POA: Diagnosis present

## 2014-07-07 DIAGNOSIS — Z7289 Other problems related to lifestyle: Secondary | ICD-10-CM

## 2014-07-07 DIAGNOSIS — I25111 Atherosclerotic heart disease of native coronary artery with angina pectoris with documented spasm: Secondary | ICD-10-CM | POA: Diagnosis present

## 2014-07-07 DIAGNOSIS — F1410 Cocaine abuse, uncomplicated: Secondary | ICD-10-CM | POA: Diagnosis not present

## 2014-07-07 DIAGNOSIS — F0630 Mood disorder due to known physiological condition, unspecified: Secondary | ICD-10-CM

## 2014-07-07 DIAGNOSIS — G43909 Migraine, unspecified, not intractable, without status migrainosus: Secondary | ICD-10-CM | POA: Diagnosis not present

## 2014-07-07 DIAGNOSIS — I739 Peripheral vascular disease, unspecified: Secondary | ICD-10-CM | POA: Diagnosis not present

## 2014-07-07 DIAGNOSIS — R072 Precordial pain: Secondary | ICD-10-CM | POA: Diagnosis not present

## 2014-07-07 DIAGNOSIS — I959 Hypotension, unspecified: Secondary | ICD-10-CM | POA: Diagnosis present

## 2014-07-07 DIAGNOSIS — J449 Chronic obstructive pulmonary disease, unspecified: Secondary | ICD-10-CM | POA: Diagnosis not present

## 2014-07-07 DIAGNOSIS — F332 Major depressive disorder, recurrent severe without psychotic features: Secondary | ICD-10-CM | POA: Diagnosis not present

## 2014-07-07 DIAGNOSIS — R45851 Suicidal ideations: Secondary | ICD-10-CM | POA: Diagnosis present

## 2014-07-07 DIAGNOSIS — F1910 Other psychoactive substance abuse, uncomplicated: Secondary | ICD-10-CM | POA: Diagnosis not present

## 2014-07-07 DIAGNOSIS — Z91199 Patient's noncompliance with other medical treatment and regimen due to unspecified reason: Secondary | ICD-10-CM

## 2014-07-07 DIAGNOSIS — G894 Chronic pain syndrome: Secondary | ICD-10-CM | POA: Diagnosis present

## 2014-07-07 DIAGNOSIS — Z794 Long term (current) use of insulin: Secondary | ICD-10-CM | POA: Diagnosis not present

## 2014-07-07 DIAGNOSIS — I5022 Chronic systolic (congestive) heart failure: Secondary | ICD-10-CM

## 2014-07-07 DIAGNOSIS — Z79899 Other long term (current) drug therapy: Secondary | ICD-10-CM | POA: Diagnosis not present

## 2014-07-07 DIAGNOSIS — R0789 Other chest pain: Secondary | ICD-10-CM | POA: Diagnosis not present

## 2014-07-07 DIAGNOSIS — L03818 Cellulitis of other sites: Secondary | ICD-10-CM | POA: Diagnosis not present

## 2014-07-07 DIAGNOSIS — I255 Ischemic cardiomyopathy: Secondary | ICD-10-CM | POA: Diagnosis not present

## 2014-07-07 DIAGNOSIS — F14129 Cocaine abuse with intoxication, unspecified: Secondary | ICD-10-CM | POA: Diagnosis not present

## 2014-07-07 DIAGNOSIS — G8929 Other chronic pain: Secondary | ICD-10-CM

## 2014-07-07 DIAGNOSIS — I25119 Atherosclerotic heart disease of native coronary artery with unspecified angina pectoris: Secondary | ICD-10-CM | POA: Diagnosis not present

## 2014-07-07 DIAGNOSIS — L03119 Cellulitis of unspecified part of limb: Secondary | ICD-10-CM | POA: Diagnosis not present

## 2014-07-07 LAB — AFB CULTURE WITH SMEAR (NOT AT ARMC): Acid Fast Smear: NONE SEEN

## 2014-07-07 LAB — MRSA PCR SCREENING: MRSA by PCR: NEGATIVE

## 2014-07-07 MED ORDER — INSULIN DETEMIR 100 UNIT/ML ~~LOC~~ SOLN
20.0000 [IU] | Freq: Every day | SUBCUTANEOUS | Status: DC
  Administered 2014-07-07 – 2014-07-11 (×5): 20 [IU] via SUBCUTANEOUS
  Filled 2014-07-07 (×6): qty 0.2

## 2014-07-07 MED ORDER — ASPIRIN 300 MG RE SUPP: 300.0000 mg | RECTAL | Status: AC

## 2014-07-07 MED ORDER — SODIUM CHLORIDE 0.9 % IJ SOLN: 3.0000 mL | INTRAMUSCULAR | Status: DC | PRN

## 2014-07-07 MED ORDER — NICOTINE 21 MG/24HR TD PT24
21.0000 mg | MEDICATED_PATCH | Freq: Every day | TRANSDERMAL | Status: DC
  Filled 2014-07-07: qty 1

## 2014-07-07 MED ORDER — ACETAMINOPHEN 325 MG PO TABS: 650.0000 mg | ORAL_TABLET | ORAL | Status: DC | PRN

## 2014-07-07 MED ORDER — PANTOPRAZOLE SODIUM 40 MG PO TBEC
40.0000 mg | DELAYED_RELEASE_TABLET | Freq: Every day | ORAL | Status: DC
  Administered 2014-07-08 – 2014-07-12 (×5): 40 mg via ORAL
  Filled 2014-07-07 (×5): qty 1

## 2014-07-07 MED ORDER — ONDANSETRON HCL 4 MG/2ML IJ SOLN
4.0000 mg | Freq: Four times a day (QID) | INTRAMUSCULAR | Status: DC | PRN
  Filled 2014-07-07: qty 2

## 2014-07-07 MED ORDER — ASPIRIN EC 81 MG PO TBEC: 81.0000 mg | DELAYED_RELEASE_TABLET | Freq: Every day | ORAL | Status: DC

## 2014-07-07 MED ORDER — GABAPENTIN 600 MG PO TABS
600.0000 mg | ORAL_TABLET | Freq: Three times a day (TID) | ORAL | Status: DC
  Administered 2014-07-07: 600 mg via ORAL
  Filled 2014-07-07 (×4): qty 1

## 2014-07-07 MED ORDER — HYDROCODONE-ACETAMINOPHEN 5-325 MG PO TABS
1.0000 | ORAL_TABLET | ORAL | Status: DC | PRN
  Administered 2014-07-08 – 2014-07-12 (×21): 2 via ORAL
  Filled 2014-07-07 (×21): qty 2

## 2014-07-07 MED ORDER — SENNOSIDES-DOCUSATE SODIUM 8.6-50 MG PO TABS
1.0000 | ORAL_TABLET | Freq: Every evening | ORAL | Status: DC | PRN
  Administered 2014-07-10: 1 via ORAL
  Filled 2014-07-07 (×2): qty 1

## 2014-07-07 MED ORDER — SODIUM CHLORIDE 0.9 % IJ SOLN
3.0000 mL | Freq: Two times a day (BID) | INTRAMUSCULAR | Status: DC
  Administered 2014-07-08 – 2014-07-11 (×4): 3 mL via INTRAVENOUS

## 2014-07-07 MED ORDER — HEPARIN SODIUM (PORCINE) 5000 UNIT/ML IJ SOLN
5000.0000 [IU] | Freq: Three times a day (TID) | INTRAMUSCULAR | Status: DC
  Administered 2014-07-08 – 2014-07-12 (×12): 5000 [IU] via SUBCUTANEOUS
  Filled 2014-07-07 (×16): qty 1

## 2014-07-07 MED ORDER — LISINOPRIL 20 MG PO TABS
20.0000 mg | ORAL_TABLET | Freq: Every day | ORAL | Status: DC
  Filled 2014-07-07: qty 1

## 2014-07-07 MED ORDER — AMITRIPTYLINE HCL 100 MG PO TABS
100.0000 mg | ORAL_TABLET | Freq: Every day | ORAL | Status: DC
  Administered 2014-07-07: 100 mg via ORAL
  Filled 2014-07-07 (×2): qty 1

## 2014-07-07 MED ORDER — AMLODIPINE BESYLATE 2.5 MG PO TABS
2.5000 mg | ORAL_TABLET | Freq: Every day | ORAL | Status: DC
  Filled 2014-07-07: qty 1

## 2014-07-07 MED ORDER — NICOTINE 21 MG/24HR TD PT24
21.0000 mg | MEDICATED_PATCH | Freq: Every day | TRANSDERMAL | Status: DC
  Administered 2014-07-08 – 2014-07-12 (×5): 21 mg via TRANSDERMAL
  Filled 2014-07-07 (×6): qty 1

## 2014-07-07 MED ORDER — SODIUM CHLORIDE 0.9 % IV SOLN
250.0000 mL | INTRAVENOUS | Status: DC | PRN
  Administered 2014-07-07: 250 mL via INTRAVENOUS

## 2014-07-07 MED ORDER — NITROGLYCERIN 0.4 MG SL SUBL: 0.4000 mg | SUBLINGUAL_TABLET | SUBLINGUAL | Status: DC | PRN

## 2014-07-07 MED ORDER — ASPIRIN EC 81 MG PO TBEC
81.0000 mg | DELAYED_RELEASE_TABLET | Freq: Every day | ORAL | Status: DC
Start: 2014-07-08 — End: 2014-07-12
  Administered 2014-07-08 – 2014-07-12 (×5): 81 mg via ORAL
  Filled 2014-07-07 (×5): qty 1

## 2014-07-07 MED ORDER — ISOSORBIDE MONONITRATE ER 30 MG PO TB24
30.0000 mg | ORAL_TABLET | Freq: Every day | ORAL | Status: DC
  Filled 2014-07-07: qty 1

## 2014-07-07 MED ORDER — CETYLPYRIDINIUM CHLORIDE 0.05 % MT LIQD
7.0000 mL | Freq: Two times a day (BID) | OROMUCOSAL | Status: DC
  Administered 2014-07-08 – 2014-07-12 (×7): 7 mL via OROMUCOSAL

## 2014-07-07 MED ORDER — ATORVASTATIN CALCIUM 80 MG PO TABS
80.0000 mg | ORAL_TABLET | Freq: Every day | ORAL | Status: DC
  Administered 2014-07-08 – 2014-07-12 (×5): 80 mg via ORAL
  Filled 2014-07-07 (×5): qty 1

## 2014-07-07 MED ORDER — ASPIRIN 81 MG PO CHEW
324.0000 mg | CHEWABLE_TABLET | ORAL | Status: AC
  Administered 2014-07-07: 324 mg via ORAL
  Filled 2014-07-07: qty 4

## 2014-07-07 NOTE — Progress Notes (Signed)
Patient arrived to 2H22 via carelink from [facility].  Patient has a nicotine patch on left upper arm that was placed by [facility] prior to arrival to 2H22. Paged Cardiologist to inform patient has arrived to unit. Ivery QualeJackson, Skyylar Kopf A, RN

## 2014-07-07 NOTE — H&P (Addendum)
Physician History and Physical    Shirley Short MRN: [id number] DOB/AGE: 54/08/1960 54 y.o. Admit date: 07/07/2014  Primary Cardiologist: [facility] [facility]  HPI: 54 yo with history of CAD and cocaine abuse, chronic pain, DMII, chronic LBBB, COPD, and HTN was transferred [facility] to [facility] for evaluation of chest pain.  Patient is an active abuser of cocaine and smokes cigarettes.  She was [facility] in 2010 with cocaine-induced CP and had cath showing distal LAD disease.  She was admitted in 2/16 to [facility] with chest pain in the setting of cocaine use. Troponin was 0.1, cardiac cath showed moderate disease (see PMH section below).  She was managed medically.  Rise in troponin thought to be vasospasm in an already diseased artery.  During this hospitalization, she reported suicidal ideation and was sent to [facility].  She was discharged from Behavioral Health in 3/16.  She started using cocaine again.  She [facility] with chest pain radiating to her neck while watching television. ECG showed chronic LBBB.  TnI 0.02.  [facility]  She was then transferred down here tonight.    Unfortunately, [facility] did not send a discharge summary.  I only have the troponin drawn initially when she arrived to [facility].  This was negative.  Cocaine was positive in urine.  Prior to [facility], she was given 3 mg IV Dilaudid.  She is, therefore, lethargic and only wakes up to say a few words and falls back asleep.  From what I can gather, she has rather diffuse pain throughout her chest and abdomen.  This has been present apparently since she arrived [facility].   Review of systems complete and found to be negative unless listed above   PMH: 1. Cocaine abuse 2. Chronic pain/fibromyalgia 3. HTN 4. Hyperlipidemia 5. Type II diabetes 6. CAD: 2010 hospitalization with cocaine-induced chest pain, LHC with distal LAD  disease.  2/16 hospitalization with NSTEMI, cocaine positive.  LHC with 70% dLAD, 70-80% small high D1, 75% PLV => medically managed.   7. Depression: Psychiatric hospitalization in 3/16 for suicidal ideation. 8. Chronic LBBB 9. COPD: Active smoker 10. Chronic systolic CHF: Echo (2/16) with EF 25-30%, anteroseptal, inferoseptal, and inferior hypokinesis, septal-lateral dyssynchrony.  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   FH: No premature CAD  History   Social History  . Marital Status: Divorced    Spouse Name: N/A  . Number of Children: N/A  . Years of Education: N/A   Occupational History  . Not on file.   Social History Main Topics  . Smoking status: Current Every Day Smoker -- 1.50 packs/day for 30 years    Types: Cigarettes  . Smokeless tobacco: Never Used  . Alcohol Use: No  . Drug Use: Yes     Comment: Cocaine  . Sexual Activity: Not on file   Other Topics Concern  . Not on file   Social History Narrative     Prescriptions prior to admission  Medication Sig Dispense Refill Last Dose  . amitriptyline (ELAVIL) 100 MG tablet Take 1 tablet by mouth at bedtime.  0 05/31/2014 at Unknown time  . amLODipine (NORVASC) 5 MG tablet Take 1 tablet (5 mg total) by mouth daily. 30 tablet 11 05/31/2014 at Unknown time  . aspirin EC 81 MG EC tablet Take 1 tablet (81 mg total) by mouth daily.   05/31/2014 at Unknown  time  . atorvastatin (LIPITOR) 80 MG tablet Take 1 tablet (80 mg total) by mouth daily at 6 PM. 30 tablet 11 05/31/2014 at Unknown time  . doxycycline (VIBRA-TABS) 100 MG tablet Take 1 tablet (100 mg total) by mouth every 12 (twelve) hours. 12 tablet 0   . gabapentin (NEURONTIN) 300 MG capsule Take 2 capsules (600 mg total) by mouth 3 (three) times daily. 120 capsule 11 05/31/2014 at Unknown time  . HYDROcodone-acetaminophen (NORCO/VICODIN) 5-325 MG per tablet Take 1-2 tablets by mouth every 4 (four) hours as needed for moderate pain. 30 tablet 0   . insulin detemir (LEVEMIR)  100 UNIT/ML injection Inject 0.2 mLs (20 Units total) into the skin at bedtime. 10 mL 11   . isosorbide mononitrate (IMDUR) 30 MG 24 hr tablet Take 1 tablet (30 mg total) by mouth daily. 3 tablet 11 05/31/2014 at Unknown time  . lansoprazole (PREVACID) 30 MG capsule Take 30 mg by mouth daily.  0 05/31/2014 at uknown time  . lisinopril (PRINIVIL,ZESTRIL) 40 MG tablet Take 1 tablet (40 mg total) by mouth daily. 30 tablet 11 05/31/2014 at Unknown time  . metFORMIN (GLUCOPHAGE) 500 MG tablet Take 500 mg by mouth 2 (two) times daily with a meal.   0 05/31/2014 at Unknown time  . nicotine (NICODERM CQ - DOSED IN MG/24 HOURS) 21 mg/24hr patch Place 1 patch (21 mg total) onto the skin daily. 28 patch 11   . pregabalin (LYRICA) 300 MG capsule Take 300 mg by mouth 3 (three) times daily.   Past Week at Unknown time  . senna-docusate (SENOKOT-S) 8.6-50 MG per tablet Take 1 tablet by mouth at bedtime as needed for mild constipation. 20 tablet 0     Physical Exam: Blood pressure 114/47, pulse 69, temperature 98.8 F (37.1 C), temperature source Oral, resp. rate 4, height  (1.626 m), weight 184 lb 4.9 oz (83.6 kg), SpO2 100 %.  General: NAD, lethargic Neck: No JVD, no thyromegaly or thyroid nodule.  Lungs: Clear to auscultation bilaterally with normal respiratory effort. CV: Nondisplaced PMI.  Heart regular S1/S2, no S3/S4, 2/6 SEM RUSB.  No peripheral edema.  No carotid bruit.  Normal pedal pulses.  Abdomen: Soft, nontender, no hepatosplenomegaly, no distention.  Skin: Intact without lesions or rashes.  Neurologic:Lethargic, opens eyes/converses for a few seconds then falls asleep.   Extremities: No clubbing or cyanosis.  HEENT: Normal.   Labs: K 3.8 Creatinine 0.68 Glucose 408 UDS +cocaine TnI 0.02 HCT 46.1   Radiology: - CXR ([facility]): No acute abnormalities  EKG: NSR, LBBB  ASSESSMENT AND PLAN: 54 yo with history of CAD and cocaine abuse, chronic pain, DMII, chronic LBBB, COPD, and HTN was  transferred from [facility] to [facility] for evaluation of chest pain.   1. Chest pain: I have 1 negative troponin from [place], no other labs sent down.  She is currently sleeping after getting a fair amount of Dilaudid this evening at [facility] for pain that seems diffuse throughout the chest and abdomen.  She wakes only for a few seconds and goes back to sleep.  Vitals stable.   She certainly may have had cocaine-related chest pain (positive for cocaine in urine), perhaps vasospasm in the setting of moderate baseline CAD.   - ASA, statin.  - Send troponin, if positive would add IV heparin gtt for now.  - If troponin negative, would avoid another cath.  - If troponin significantly elevated, may need another cath.   - Add Imdur  30 mg daily for vasospasm.  If BP comes up a bit (soft right now in the setting of sedation), would also consider addition of amlodipine.  - Avoid beta blocker.  2. Chronic pain: Would try to avoid narcotic use as much as possible, she is over-sedated.  3. LBBB: Chronic.  4. Chronic systolic CHF: Possible ischemic CMP versus cocaine-related CMP.  EF 25-30% on echo in 2/16.  Avoid beta blocker with active cocaine use.  ACEI would be reasonable if BP tolerates.  5. Substance abuse: Cocaine and tobacco, will need to stop.   Signed: Marca Ancona 07/07/2014, 10:34 PM

## 2014-07-08 DIAGNOSIS — I2510 Atherosclerotic heart disease of native coronary artery without angina pectoris: Secondary | ICD-10-CM

## 2014-07-08 DIAGNOSIS — R072 Precordial pain: Secondary | ICD-10-CM

## 2014-07-08 DIAGNOSIS — I447 Left bundle-branch block, unspecified: Secondary | ICD-10-CM

## 2014-07-08 DIAGNOSIS — F1910 Other psychoactive substance abuse, uncomplicated: Secondary | ICD-10-CM

## 2014-07-08 LAB — BASIC METABOLIC PANEL
Anion gap: 9 (ref 5–15)
BUN: 18 mg/dL (ref 6–23)
CHLORIDE: 104 mmol/L (ref 96–112)
CO2: 21 mmol/L (ref 19–32)
CREATININE: 1.07 mg/dL (ref 0.50–1.10)
Calcium: 8.6 mg/dL (ref 8.4–10.5)
GFR calc Af Amer: 67 mL/min — ABNORMAL LOW (ref >90)
GFR calc non Af Amer: 58 mL/min — ABNORMAL LOW (ref >90)
GLUCOSE: 103 mg/dL — AB (ref 70–99)
POTASSIUM: 3.9 mmol/L (ref 3.5–5.1)
Sodium: 134 mmol/L — ABNORMAL LOW (ref 135–145)

## 2014-07-08 LAB — COMPREHENSIVE METABOLIC PANEL
ALBUMIN: 3 g/dL — AB (ref 3.5–5.2)
ALT: 144 U/L — AB (ref 0–35)
AST: 190 U/L — ABNORMAL HIGH (ref 0–37)
Alkaline Phosphatase: 119 U/L — ABNORMAL HIGH (ref 39–117)
Anion gap: 8 (ref 5–15)
BUN: 18 mg/dL (ref 6–23)
CALCIUM: 8.7 mg/dL (ref 8.4–10.5)
CO2: 22 mmol/L (ref 19–32)
Chloride: 104 mmol/L (ref 96–112)
Creatinine, Ser: 1.08 mg/dL (ref 0.50–1.10)
GFR calc non Af Amer: 57 mL/min — ABNORMAL LOW (ref >90)
GFR, EST AFRICAN AMERICAN: 66 mL/min — AB (ref >90)
GLUCOSE: 102 mg/dL — AB (ref 70–99)
Potassium: 4 mmol/L (ref 3.5–5.1)
SODIUM: 134 mmol/L — AB (ref 135–145)
TOTAL PROTEIN: 5.6 g/dL — AB (ref 6.0–8.3)
Total Bilirubin: 0.3 mg/dL (ref 0.3–1.2)

## 2014-07-08 LAB — GLUCOSE, CAPILLARY
GLUCOSE-CAPILLARY: 81 mg/dL (ref 70–99)
Glucose-Capillary: 334 mg/dL — ABNORMAL HIGH (ref 70–99)

## 2014-07-08 LAB — CBC WITH DIFFERENTIAL/PLATELET
BASOS ABS: 0 10*3/uL (ref 0.0–0.1)
BASOS PCT: 0 % (ref 0–1)
Eosinophils Absolute: 0.1 10*3/uL (ref 0.0–0.7)
Eosinophils Relative: 1 % (ref 0–5)
HCT: 39.6 % (ref 36.0–46.0)
Hemoglobin: 12.8 g/dL (ref 12.0–15.0)
Lymphocytes Relative: 22 % (ref 12–46)
Lymphs Abs: 2.9 10*3/uL (ref 0.7–4.0)
MCH: 27.6 pg (ref 26.0–34.0)
MCHC: 32.3 g/dL (ref 30.0–36.0)
MCV: 85.3 fL (ref 78.0–100.0)
Monocytes Absolute: 0.7 10*3/uL (ref 0.1–1.0)
Monocytes Relative: 5 % (ref 3–12)
NEUTROS ABS: 9.5 10*3/uL — AB (ref 1.7–7.7)
Neutrophils Relative %: 72 % (ref 43–77)
Platelets: 159 10*3/uL (ref 150–400)
RBC: 4.64 MIL/uL (ref 3.87–5.11)
RDW: 14.4 % (ref 11.5–15.5)
WBC: 13.2 10*3/uL — AB (ref 4.0–10.5)

## 2014-07-08 LAB — PROTIME-INR
INR: 0.95 (ref 0.00–1.49)
PROTHROMBIN TIME: 12.8 s (ref 11.6–15.2)

## 2014-07-08 LAB — BRAIN NATRIURETIC PEPTIDE: B Natriuretic Peptide: 104.1 pg/mL — ABNORMAL HIGH (ref 0.0–100.0)

## 2014-07-08 LAB — TSH: TSH: 0.709 u[IU]/mL (ref 0.350–4.500)

## 2014-07-08 LAB — TROPONIN I: Troponin I: <0.03 ng/mL (ref <0.031)

## 2014-07-08 MED ORDER — SODIUM CHLORIDE 0.9 % IV SOLN: Freq: Once | INTRAVENOUS | Status: AC

## 2014-07-08 MED ORDER — GABAPENTIN 600 MG PO TABS
600.0000 mg | ORAL_TABLET | Freq: Three times a day (TID) | ORAL | Status: DC
  Administered 2014-07-08 – 2014-07-11 (×8): 600 mg via ORAL
  Filled 2014-07-08 (×10): qty 1

## 2014-07-08 MED ORDER — GABAPENTIN 300 MG PO CAPS
600.0000 mg | ORAL_CAPSULE | Freq: Three times a day (TID) | ORAL | Status: DC
  Filled 2014-07-08: qty 2

## 2014-07-08 NOTE — Progress Notes (Signed)
Pt complaining of a headache and has been complaining of "excrutiating" pain all day. I have spoken with Dr. Clifton JamesMcAlhany about her pain level, but pt BP is extremely soft and she continues to be lethargic at times. Speech is slurred at times when pt tries to talk without opening her eyes.  I made another call to Ward Givenshris Berge, NP at pt request as the she states "either y'all give me something or I'm leaving, I've got plenty of pain medicine at home."  Mr. Brion AlimentBerge also thinks it would be inappropriate to administer any additional pain medicine at this time, just as Dr. Clifton JamesMcAlhany instructed me.   Pt has been asleep since leaving the room to make this phone call, but I will inform her of his assessment.

## 2014-07-08 NOTE — Progress Notes (Signed)
Pt aware she can not have narcotics. A&Ox4. Asked if she could get her gabapentin and lyrica for fibromyalgia pain. Cards fellow paged. Gabapentin ordered. Will continue to monitor.

## 2014-07-08 NOTE — Progress Notes (Signed)
Pt complaining of pain, being hungry and wants to know why she can't eat. Pt is agitated, threw medicine cup and using curse words because we are "not taking care of her and don't know what the h--- we are doing"  Made a phone call to Dr. Clifton JamesMcAlhany to see if pt NPO status could be changed and reported her pain/agitation. He gave the order to change to heart healthy diet and instructed me that he would not be giving any additional pain medication orders due to patient's soft BP and lethargy. He said that she was very lethargic on his morning round as well.

## 2014-07-08 NOTE — Progress Notes (Signed)
SUBJECTIVE: Chest pain is improved, still with mild pain.   BP 92/40 mmHg  Pulse 73  Temp(Src) 98.8 F (37.1 C) (Oral)  Resp 25  Ht  (1.626 m)  Wt 184 lb 4.9 oz (83.6 kg)  BMI 31.62 kg/m2  SpO2 100%  Intake/Output Summary (Last 24 hours) at 07/08/14 1610 Last data filed at 07/08/14 0600  Gross per 24 hour  Intake  66.17 ml  Output      0 ml  Net  66.17 ml    PHYSICAL EXAM General: Well developed, well nourished, in no acute distress. Somnolent. Awakes easily and oriented to person and place.   Psych:  Flat affect Neck: No JVD. No masses noted.  Lungs: Clear bilaterally with no wheezes or rhonci noted.  Heart: RRR with no murmurs noted. Abdomen: Bowel sounds are present. Soft, non-tender.  Extremities: No lower extremity edema.   LABS: Basic Metabolic Panel:  Recent Labs  96/04/[birth year] 0025  NA 134*  134*  K 4.0  3.9  CL 104  104  CO2 22  21  GLUCOSE 102*  103*  BUN 18  18  CREATININE 1.08  1.07  CALCIUM 8.7  8.6   CBC:  Recent Labs  07/08/14 0025  WBC 13.2*  NEUTROABS 9.5*  HGB 12.8  HCT 39.6  MCV 85.3  PLT 159   Cardiac Enzymes:  Recent Labs  07/08/14 0025  TROPONINI <0.03  <0.03   Current Meds: . amitriptyline  100 mg Oral QHS  . amLODipine  2.5 mg Oral Daily  . antiseptic oral rinse  7 mL Mouth Rinse BID  . aspirin EC  81 mg Oral Daily  . atorvastatin  80 mg Oral q1800  . gabapentin  600 mg Oral TID  . heparin  5,000 Units Subcutaneous 3 times per day  . insulin detemir  20 Units Subcutaneous QHS  . isosorbide mononitrate  30 mg Oral Daily  . lisinopril  20 mg Oral Daily  . nicotine  21 mg Transdermal Daily  . pantoprazole  40 mg Oral Daily  . sodium chloride  3 mL Intravenous Q12H   Cardiac cath 05/23/14: 1. Left main; normal  2. LAD; 70% smooth segmental distal. There was a high diagonal branch which was small in caliber that had 70-80% segmental mid stenosis 3. Left circumflex; codominant and free of significant  disease.  4. Right coronary artery; codominant with 75% segmental PLA stenosis. The PDA arose high in the RCA 5. Left ventriculography; RAO left ventriculogram was performed using  25 mL of Visipaque dye at 12 mL/second. The overall LVEF estimated  60 % Without wall motion abnormalities  ASSESSMENT AND PLAN: 54 yo with history of moderate CAD by cath 05/23/14, ongoing cocaine abuse, chronic pain, DMII, chronic LBBB, COPD, and HTN was transferred from [facility] to [facility] for evaluation of chest pain.Troponin is negative at [facility] x 2.   1. Chest pain: She is known to have moderate CAD by cath 04/2914. Now presenting with chest pain in the setting of cocaine use. Likely coronary vasospasm. Chest pain is better this am and troponin is negative. She has been told during prior admissions that cocaine will cause vasospasm. She is on ASA and statin. Imdur has been ordered but she is hypotensive. Will stop Imdur, Lisinopril and amlodipine. Avoid beta blocker. No invasive evaluation is planned. Gentle hydration with hypotension this am.    2. Chronic pain: Would try to avoid narcotic use as much as  possible, she is over-sedated.   3. LBBB: Chronic.   4. Chronic systolic CHF: Possible ischemic CMP versus cocaine-related CMP. EF 25-30% on echo in 2/16. Avoid beta blocker with active cocaine use. ACEI would be reasonable if BP tolerates.   5. Substance abuse: Cocaine and tobacco, will need to stop.   Lawsen Arnott  4/15/20168:38 AM

## 2014-07-08 NOTE — Care Management Note (Addendum)
    Page 1 of 1   07/13/2014     9:50:28 AM CARE MANAGEMENT NOTE 07/13/2014  Patient:  [name],Lilianne L   Account Number:  [id number]  Date Initiated:  07/08/2014  Documentation initiated by:  Junius CreamerWELL,DEBBIE  Subjective/Objective Assessment:   adm w ch pain, hypotension     Action/Plan:   lives alone   Anticipated DC Date:  07/13/2014   Anticipated DC Plan:  PSYCHIATRIC HOSPITAL  In-house referral  Clinical Social Worker      DC Planning Services  CM consult      Choice offered to / List presented to:             Status of service:  Completed, signed off Medicare Important Message given?   (If response is "NO", the following Medicare IM given date fields will be blank) Date Medicare IM given:   Medicare IM given by:   Date Additional Medicare IM given:   Additional Medicare IM given by:    Discharge Disposition:  PSYCHIATRIC HOSPITAL  Per UR Regulation:  Reviewed for med. necessity/level of care/duration of stay  If discussed at Long Length of Stay Meetings, dates discussed:    Comments:  07/12/14- 1500- Donn PieriniKristi Jan Walters RN BSN [phone] Pt with suicidal thoughts- psych following pt for inpt facility placement- CSW to work on psych placement- pt will need outpt behavioral health f/u- and needs PCP f/u also- pt not in cognitive state to speak with her today regarding PCP needs.

## 2014-07-08 NOTE — Progress Notes (Signed)
Pt stated "I have not seen any Doctor today" This RN explianed that both Dr. Shirlee LatchMcLean and Shirley Short were here this am per report received. Pt stated they don't care about my pain. Pt stated please call my Doctor. On call MD paged, awaiting response. vicodin 2 tabs offered to pt at this time.  Flavia Shipper. Berge rounding on floor and updated on pt's status. Stated pt will not be receiving any narcotic except the vicodin at this time. Will cont to monitor. Pt informed and states to get her paperwork ready. I told her there is no paper work since she is the one discharging herself. She only needs to sign AMA paper. She stated " I don't want to do that". Encouraged pt to be patient and speak with MD in am. Will monitor closely.

## 2014-07-09 LAB — GLUCOSE, CAPILLARY
GLUCOSE-CAPILLARY: 138 mg/dL — AB (ref 70–99)
GLUCOSE-CAPILLARY: 235 mg/dL — AB (ref 70–99)
Glucose-Capillary: 97 mg/dL (ref 70–99)
Glucose-Capillary: 289 mg/dL — ABNORMAL HIGH (ref 70–99)
Glucose-Capillary: 290 mg/dL — ABNORMAL HIGH (ref 70–99)

## 2014-07-09 LAB — HEMOGLOBIN A1C
Hgb A1c MFr Bld: 11.4 % — ABNORMAL HIGH (ref 4.8–5.6)
Mean Plasma Glucose: 280 mg/dL

## 2014-07-09 MED ORDER — KETOROLAC TROMETHAMINE 30 MG/ML IJ SOLN: 30.0000 mg | Freq: Once | INTRAMUSCULAR | Status: DC

## 2014-07-09 MED ORDER — HYDROMORPHONE HCL 1 MG/ML IJ SOLN: 0.5000 mg | Freq: Once | INTRAMUSCULAR | Status: DC

## 2014-07-09 MED ORDER — METFORMIN HCL 500 MG PO TABS
500.0000 mg | ORAL_TABLET | Freq: Every day | ORAL | Status: DC
  Administered 2014-07-09 – 2014-07-12 (×4): 500 mg via ORAL
  Filled 2014-07-09 (×5): qty 1

## 2014-07-09 MED ORDER — INSULIN ASPART 100 UNIT/ML ~~LOC~~ SOLN
0.0000 [IU] | Freq: Three times a day (TID) | SUBCUTANEOUS | Status: DC
  Administered 2014-07-09: 8 [IU] via SUBCUTANEOUS
  Administered 2014-07-09: 5 [IU] via SUBCUTANEOUS
  Administered 2014-07-09: 2 [IU] via SUBCUTANEOUS
  Administered 2014-07-10 (×3): 3 [IU] via SUBCUTANEOUS
  Administered 2014-07-11: 8 [IU] via SUBCUTANEOUS
  Administered 2014-07-11 – 2014-07-12 (×3): 5 [IU] via SUBCUTANEOUS
  Administered 2014-07-12: 8 [IU] via SUBCUTANEOUS
  Administered 2014-07-12: 3 [IU] via SUBCUTANEOUS

## 2014-07-09 MED ORDER — LISINOPRIL 20 MG PO TABS
20.0000 mg | ORAL_TABLET | Freq: Every day | ORAL | Status: DC
  Administered 2014-07-09 – 2014-07-12 (×4): 20 mg via ORAL
  Filled 2014-07-09 (×4): qty 1

## 2014-07-09 MED ORDER — ROPINIROLE HCL 0.25 MG PO TABS
0.2500 mg | ORAL_TABLET | Freq: Every day | ORAL | Status: DC
  Administered 2014-07-09 – 2014-07-11 (×3): 0.25 mg via ORAL
  Filled 2014-07-09 (×4): qty 1

## 2014-07-09 MED ORDER — ONDANSETRON HCL 4 MG/2ML IJ SOLN
4.0000 mg | Freq: Once | INTRAMUSCULAR | Status: AC
  Administered 2014-07-09: 4 mg via INTRAVENOUS

## 2014-07-09 MED ORDER — AMLODIPINE BESYLATE 5 MG PO TABS
5.0000 mg | ORAL_TABLET | Freq: Every day | ORAL | Status: DC
  Administered 2014-07-09 – 2014-07-12 (×4): 5 mg via ORAL
  Filled 2014-07-09 (×4): qty 1

## 2014-07-09 MED ORDER — TRAZODONE HCL 150 MG PO TABS
300.0000 mg | ORAL_TABLET | Freq: Four times a day (QID) | ORAL | Status: DC
  Administered 2014-07-09 – 2014-07-11 (×8): 300 mg via ORAL
  Filled 2014-07-09 (×10): qty 2

## 2014-07-09 MED ORDER — IBUPROFEN 200 MG PO TABS: 400.0000 mg | ORAL_TABLET | Freq: Four times a day (QID) | ORAL | Status: DC | PRN

## 2014-07-09 MED ORDER — INSULIN ASPART 100 UNIT/ML ~~LOC~~ SOLN: 0.0000 [IU] | Freq: Three times a day (TID) | SUBCUTANEOUS | Status: DC

## 2014-07-09 MED ORDER — DULOXETINE HCL 20 MG PO CPEP
20.0000 mg | ORAL_CAPSULE | Freq: Every day | ORAL | Status: DC
  Administered 2014-07-09 – 2014-07-11 (×3): 20 mg via ORAL
  Filled 2014-07-09 (×3): qty 1

## 2014-07-09 MED ORDER — ALPRAZOLAM 0.5 MG PO TABS
1.0000 mg | ORAL_TABLET | Freq: Three times a day (TID) | ORAL | Status: DC | PRN
  Administered 2014-07-09 – 2014-07-11 (×5): 1 mg via ORAL
  Filled 2014-07-09 (×5): qty 2

## 2014-07-09 MED ORDER — HYDROMORPHONE HCL 1 MG/ML IJ SOLN
0.5000 mg | Freq: Once | INTRAMUSCULAR | Status: AC
  Administered 2014-07-09: 0.5 mg via INTRAMUSCULAR
  Filled 2014-07-09: qty 1

## 2014-07-09 MED ORDER — ISOSORBIDE MONONITRATE ER 30 MG PO TB24
30.0000 mg | ORAL_TABLET | Freq: Every day | ORAL | Status: DC
  Administered 2014-07-09 – 2014-07-12 (×4): 30 mg via ORAL
  Filled 2014-07-09 (×4): qty 1

## 2014-07-09 MED ORDER — PANTOPRAZOLE SODIUM 40 MG PO TBEC: 40.0000 mg | DELAYED_RELEASE_TABLET | Freq: Every day | ORAL | Status: DC

## 2014-07-09 MED ORDER — KETOROLAC TROMETHAMINE 30 MG/ML IJ SOLN
30.0000 mg | Freq: Once | INTRAMUSCULAR | Status: AC
  Administered 2014-07-09: 30 mg via INTRAMUSCULAR
  Filled 2014-07-09: qty 1

## 2014-07-09 NOTE — Progress Notes (Addendum)
2RN from iv team came to evaluate to insert iv line but unsuccessful, MD aware who claimed  Not to worry about it.

## 2014-07-09 NOTE — Progress Notes (Signed)
Very  manipulative when it comes to her pain meds,consistently ask for it even it is not yet time. Explained to her that we  have to be careful with pain meds since she became lethargic yesterday because of this. Claimed that she is never been taken cared  here  and no one is addressing her needs. Md made aware.

## 2014-07-09 NOTE — Progress Notes (Signed)
Lab came by to draw AM labs. Pt refused. Educated on need for labs. Pt still refused.

## 2014-07-09 NOTE — Progress Notes (Signed)
Transferred to 2 west room 19 by wheelchair , stable. Looking for her clothes but nothing found in the room, pt is a transfer from [facility].report given to RN.

## 2014-07-09 NOTE — Progress Notes (Signed)
Patient Name: Shirley SimmondsDonna L Motter Date of Encounter: 07/09/2014  Active Problems:   Chest pain   Length of Stay: 2  SUBJECTIVE  She complains of a migraine, denies chest pain. Has some nausea. Feels anxious - takes xanax scheduled TID. Refused to allow blood draws. Asking for pain meds.  CURRENT MEDS . amLODipine  5 mg Oral Daily  . antiseptic oral rinse  7 mL Mouth Rinse BID  . aspirin EC  81 mg Oral Daily  . atorvastatin  80 mg Oral q1800  . gabapentin  600 mg Oral TID  . heparin  5,000 Units Subcutaneous 3 times per day  .  HYDROmorphone (DILAUDID) injection  0.5 mg Intravenous Once  . insulin aspart  0-15 Units Subcutaneous TID WC  . insulin detemir  20 Units Subcutaneous QHS  . isosorbide mononitrate  30 mg Oral Daily  . ketorolac  30 mg Intravenous Once  . lisinopril  20 mg Oral Daily  . nicotine  21 mg Transdermal Daily  . ondansetron (ZOFRAN) IV  4 mg Intravenous Once  . pantoprazole  40 mg Oral Daily  . sodium chloride  3 mL Intravenous Q12H    OBJECTIVE   Intake/Output Summary (Last 24 hours) at 07/09/14 1245 Last data filed at 07/09/14 0900  Gross per 24 hour  Intake   1750 ml  Output      0 ml  Net   1750 ml   Filed Weights   07/07/14 2147  Weight: 184 lb 4.9 oz (83.6 kg)    PHYSICAL EXAM Filed Vitals:   07/09/14 0515 07/09/14 0749 07/09/14 0800 07/09/14 1138  BP: 158/67 170/87 184/65 179/67  Pulse:      Temp:  98.5 F (36.9 C)  98.7 F (37.1 C)  TempSrc:  Oral  Oral  Resp: 18 20 17 16   Height:      Weight:      SpO2: 96% 96%  96%   General: Alert, oriented x3, no distress Head: no evidence of trauma, PERRL, EOMI, no exophtalmos or lid lag, no myxedema, no xanthelasma; normal ears, nose and oropharynx Neck: normal jugular venous pulsations and no hepatojugular reflux; brisk carotid pulses without delay and no carotid bruits Chest: clear to auscultation, no signs of consolidation by percussion or palpation, normal fremitus, symmetrical and full  respiratory excursions Cardiovascular: normal position and quality of the apical impulse, regular rhythm, normal first and second heart sounds, no rubs or gallops, no murmur Abdomen: no tenderness or distention, no masses by palpation, no abnormal pulsatility or arterial bruits, normal bowel sounds, no hepatosplenomegaly Extremities: no clubbing, cyanosis or edema; 2+ radial, ulnar and brachial pulses bilaterally; 2+ right femoral, posterior tibial and dorsalis pedis pulses; 2+ left femoral, posterior tibial and dorsalis pedis pulses; no subclavian or femoral bruits Neurological: grossly nonfocal  LABS  CBC  Recent Labs  07/08/14 0025  WBC 13.2*  NEUTROABS 9.5*  HGB 12.8  HCT 39.6  MCV 85.3  PLT 159   Basic Metabolic Panel  Recent Labs  07/08/14 0025  NA 134*  134*  K 4.0  3.9  CL 104  104  CO2 22  21  GLUCOSE 102*  103*  BUN 18  18  CREATININE 1.08  1.07  CALCIUM 8.7  8.6   Liver Function Tests  Recent Labs  07/08/14 0025  AST 190*  ALT 144*  ALKPHOS 119*  BILITOT 0.3  PROT 5.6*  ALBUMIN 3.0*   No results for input(s): LIPASE, AMYLASE in the last  72 hours. Cardiac Enzymes  Recent Labs  07/08/14 0025 07/08/14 1103  TROPONINI <0.03  <0.03 <0.03   BNP Invalid input(s): POCBNP D-Dimer No results for input(s): DDIMER in the last 72 hours. Hemoglobin A1C  Recent Labs  07/08/14 0025  HGBA1C 11.4*   Fasting Lipid Panel No results for input(s): CHOL, HDL, LDLCALC, TRIG, CHOLHDL, LDLDIRECT in the last 72 hours. Thyroid Function Tests  Recent Labs  07/08/14 0025  TSH 0.709    Radiology Studies Imaging results have been reviewed and No results found.  TELE NSR, LBBB   UDS positive for cocaine, opiates, benzos  ASSESSMENT AND PLAN  54 yo with history of moderate CAD by cath 05/23/14, ongoing cocaine abuse, chronic pain, DMII, chronic LBBB, COPD, and HTN was transferred from [facility] to [facility] for evaluation of chest  pain.Troponin is negative at [facility] x 3.   1. Chest pain: She is known to have moderate CAD by cath 04/2914. Now presenting with chest pain in the setting of cocaine use. Likely coronary vasospasm. Chest pain is resolved and troponin is negative. She has been told during prior admissions that cocaine will cause vasospasm. She is on ASA and statin. No longer hypotensive. Will restart Imdur, Lisinopril and amlodipine. Avoid beta blocker. No invasive evaluation is planned.    2. Chronic pain: Would try to avoid narcotic use as much as possible. Resume alprazolam to avoid withdrawal  3. LBBB: Chronic.   4. Chronic systolic CHF: Possible ischemic CMP versus cocaine-related CMP. EF 25-30% on echo in 2/16. Avoid beta blocker with active cocaine use. ACEI restarted  5. Substance abuse: Cocaine and tobacco, will need to stop.     Thurmon Fair, MD, [facility] [phone] office [phone] pager 07/09/2014 12:45 PM

## 2014-07-09 NOTE — Progress Notes (Signed)
07/09/2014 1430 Received to 2w19 a transfer from 2H.  Pt. Is requesting pain meds, "I have the worst headache I've ever had, no one is giving me anything for it."  Pt. Just received pain med prior to transfer.  Will review chart and give prn for pain if available.  Oriented to room, call light and bed.  Call bell in reach. Kathryne HitchAllen, Zakiyyah Savannah C

## 2014-07-09 NOTE — Progress Notes (Signed)
MD made aware about pt's refusal for lab drawing as endorsed.

## 2014-07-10 DIAGNOSIS — Z720 Tobacco use: Secondary | ICD-10-CM

## 2014-07-10 DIAGNOSIS — I429 Cardiomyopathy, unspecified: Secondary | ICD-10-CM

## 2014-07-10 LAB — GLUCOSE, CAPILLARY
Glucose-Capillary: 173 mg/dL — ABNORMAL HIGH (ref 70–99)
Glucose-Capillary: 158 mg/dL — ABNORMAL HIGH (ref 70–99)
Glucose-Capillary: 183 mg/dL — ABNORMAL HIGH (ref 70–99)

## 2014-07-10 NOTE — Progress Notes (Signed)
Admitting cardiologist: Dr. Marca Ancona  Seen for followup: Chest pain, CAD  Subjective:    No specific complaints this morning, headache noted yesterday. She denies chest pain, states that she slept well, but has poor energy.  Objective:   Temp:  [97.6 F (36.4 C)-98.7 F (37.1 C)] 97.6 F (36.4 C) (04/17 0528) Pulse Rate:  [61-78] 61 (04/17 0528) Resp:  [16-19] 19 (04/17 0528) BP: (94-179)/(34-67) 94/34 mmHg (04/17 0528) SpO2:  [96 %-100 %] 100 % (04/17 0528) Weight:  [188 lb 0.8 oz (85.3 kg)] 188 lb 0.8 oz (85.3 kg) (04/17 0528) Last BM Date: 07/02/14  [facility] Weights   07/07/14 2147 07/10/14 0528  Weight: 184 lb 4.9 oz (83.6 kg) 188 lb 0.8 oz (85.3 kg)    Intake/Output Summary (Last 24 hours) at 07/10/14 1000 Last data filed at 07/10/14 0902  Gross per 24 hour  Intake    120 ml  Output      0 ml  Net    120 ml    Telemetry: Sinus rhythm.  Exam:  General: Chronically ill-appearing, no distress.  Lungs: Clear, nonlabored.  Cardiac: RRR, no gallop.  Abdomen: NABS.  Extremities: No pitting.  Lab Results:  Basic Metabolic Panel:  Recent Labs Lab 07/08/14 0025  NA 134*  134*  K 4.0  3.9  CL 104  104  CO2 22  21  GLUCOSE 102*  103*  BUN 18  18  CREATININE 1.08  1.07  CALCIUM 8.7  8.6    Liver Function Tests:  Recent Labs Lab 07/08/14 0025  AST 190*  ALT 144*  ALKPHOS 119*  BILITOT 0.3  PROT 5.6*  ALBUMIN 3.0*    CBC:  Recent Labs Lab 07/08/14 0025  WBC 13.2*  HGB 12.8  HCT 39.6  MCV 85.3  PLT 159    Cardiac Enzymes:  Recent Labs Lab 07/08/14 0025 07/08/14 1103  TROPONINI <0.03  <0.03 <0.03    Echocardiogram 05/23/2014: Study Conclusions  - Left ventricle: The cavity size was normal. Wall thickness was increased in a pattern of mild LVH. Systolic function was severely reduced. The estimated ejection fraction was in the range of 25% to 30%. Anteroseptal, inferoseptal, and inferior severe  hypokinesis. Septal-lateral dyssynchrony. Doppler parameters are consistent with abnormal left ventricular relaxation (grade 1 diastolic dysfunction). - Aortic valve: There was no stenosis. - Mitral valve: Mildly calcified annulus. Mildly calcified leaflets . There was no significant regurgitation. - Right ventricle: The cavity size was normal. Systolic function was normal. - Pulmonary arteries: No complete TR doppler jet so unable to estimate PA systolic pressure. - Inferior vena cava: The vessel was normal in size. The respirophasic diameter changes were in the normal range (>= 50%), consistent with normal central venous pressure. - Pericardium, extracardiac: A trivial pericardial effusion was identified.  Impressions:  - Normal LV size with mild LV hypertrophy. EF 25-30% with wall motion abnormalities as noted above. Normal RV size and systolic function. No significant valvular abnormalities.   Medications:   Scheduled Medications: . amLODipine  5 mg Oral Daily  . antiseptic oral rinse  7 mL Mouth Rinse BID  . aspirin EC  81 mg Oral Daily  . atorvastatin  80 mg Oral q1800  . DULoxetine  20 mg Oral Daily  . gabapentin  600 mg Oral TID  . heparin  5,000 Units Subcutaneous 3 times per day  . insulin aspart  0-15 Units Subcutaneous TID WC  . insulin detemir  20 Units Subcutaneous QHS  .  isosorbide mononitrate  30 mg Oral Daily  . lisinopril  20 mg Oral Daily  . metFORMIN  500 mg Oral Q breakfast  . nicotine  21 mg Transdermal Daily  . pantoprazole  40 mg Oral Daily  . rOPINIRole  0.25 mg Oral QHS  . sodium chloride  3 mL Intravenous Q12H  . trazodone  300 mg Oral QID     PRN Medications:  sodium chloride, acetaminophen, ALPRAZolam, HYDROcodone-acetaminophen, ibuprofen, nitroGLYCERIN, ondansetron (ZOFRAN) IV, senna-docusate, sodium chloride   Assessment:   1. Chest pain, presently resolved without clear evidence of ACS by cardiac enzymes. Patient  has known, moderate CAD by recent cardiac catheterization, and also recent cocaine abuse.  2. Chronic left bundle branch block.  3. Chronic pain with history of opiate abuse. Patient is on Neurontin, Lyrica, trazodone, possibly other pain medications as an outpatient. She states that she does not have a primary care provider - previous one was at a facility in [place]. Recent admission to behavioral health noted.  4. Cardiomyopathy with chronic systolic heart failure, medically stable. LVEF 25-30% by recent echocardiogram. On ACE inhibitor but no beta blocker with cocaine abuse.  5. Tobacco and cocaine use, recurring.  Plan/Discussion:    Difficult situation in patient with poor overall prognosis. She does not require further cardiac testing at this time, would recommend continuing medical therapy. She states that she is very weak, we will have her ambulate today. Continue aspirin, Norvasc, lisinopril, and Imdur. Holding off beta blocker with history of recurring cocaine abuse. Will ask case manager to see in terms of disposition and follow-up concerns. Patient has high risk for readmission and continuing adverse events. She should not simply be discharged home with a cardiology office follow-up arranged, and no plan for primary care provider or behavioral health follow-up. Tobacco and cocaine cessation was discussed with her, although this has been a recurring problem.   Jonelle SidleSamuel G. Avery Eustice, M.D., F.A.C.C.

## 2014-07-11 ENCOUNTER — Encounter (HOSPITAL_COMMUNITY): Payer: Self-pay | Admitting: General Practice

## 2014-07-11 DIAGNOSIS — F1994 Other psychoactive substance use, unspecified with psychoactive substance-induced mood disorder: Secondary | ICD-10-CM

## 2014-07-11 LAB — BASIC METABOLIC PANEL
Anion gap: 7 (ref 5–15)
BUN: 12 mg/dL (ref 6–23)
CO2: 27 mmol/L (ref 19–32)
Calcium: 9.5 mg/dL (ref 8.4–10.5)
Chloride: 103 mmol/L (ref 96–112)
Creatinine, Ser: 0.71 mg/dL (ref 0.50–1.10)
GFR calc Af Amer: >90 mL/min (ref >90)
GFR calc non Af Amer: >90 mL/min (ref >90)
Glucose, Bld: 262 mg/dL — ABNORMAL HIGH (ref 70–99)
Potassium: 4.8 mmol/L (ref 3.5–5.1)
Sodium: 137 mmol/L (ref 135–145)

## 2014-07-11 LAB — GLUCOSE, CAPILLARY
GLUCOSE-CAPILLARY: 289 mg/dL — AB (ref 70–99)
GLUCOSE-CAPILLARY: 238 mg/dL — AB (ref 70–99)
Glucose-Capillary: 211 mg/dL — ABNORMAL HIGH (ref 70–99)
Glucose-Capillary: 213 mg/dL — ABNORMAL HIGH (ref 70–99)
Glucose-Capillary: 186 mg/dL — ABNORMAL HIGH (ref 70–99)

## 2014-07-11 LAB — CBC
HCT: 36.6 % (ref 36.0–46.0)
Hemoglobin: 11.9 g/dL — ABNORMAL LOW (ref 12.0–15.0)
MCH: 27.9 pg (ref 26.0–34.0)
MCHC: 32.5 g/dL (ref 30.0–36.0)
MCV: 85.7 fL (ref 78.0–100.0)
Platelets: 128 10*3/uL — ABNORMAL LOW (ref 150–400)
RBC: 4.27 MIL/uL (ref 3.87–5.11)
RDW: 14 % (ref 11.5–15.5)
WBC: 6.9 10*3/uL (ref 4.0–10.5)

## 2014-07-11 MED ORDER — LORAZEPAM 2 MG/ML IJ SOLN
1.0000 mg | Freq: Four times a day (QID) | INTRAMUSCULAR | Status: DC | PRN
  Administered 2014-07-11 – 2014-07-12 (×4): 1 mg via INTRAMUSCULAR
  Filled 2014-07-11 (×4): qty 1

## 2014-07-11 MED ORDER — DULOXETINE HCL 20 MG PO CPEP
40.0000 mg | ORAL_CAPSULE | Freq: Every day | ORAL | Status: DC
  Administered 2014-07-12: 40 mg via ORAL
  Filled 2014-07-11: qty 2

## 2014-07-11 MED ORDER — TRAZODONE HCL 150 MG PO TABS
300.0000 mg | ORAL_TABLET | Freq: Every day | ORAL | Status: DC
  Filled 2014-07-11: qty 2

## 2014-07-11 MED ORDER — GABAPENTIN 800 MG PO TABS
800.0000 mg | ORAL_TABLET | Freq: Three times a day (TID) | ORAL | Status: DC
  Filled 2014-07-11 (×2): qty 1

## 2014-07-11 MED ORDER — DOCUSATE SODIUM 100 MG PO CAPS
100.0000 mg | ORAL_CAPSULE | Freq: Two times a day (BID) | ORAL | Status: DC | PRN
  Administered 2014-07-11: 100 mg via ORAL
  Filled 2014-07-11: qty 1

## 2014-07-11 MED ORDER — GABAPENTIN 400 MG PO CAPS
800.0000 mg | ORAL_CAPSULE | Freq: Three times a day (TID) | ORAL | Status: DC
  Administered 2014-07-11 – 2014-07-12 (×4): 800 mg via ORAL
  Filled 2014-07-11 (×5): qty 2

## 2014-07-11 NOTE — Clinical Social Work Psych Note (Signed)
Psychiatry consulted.  Disposition: Inpatient Psychiatric Admission.  Psych CSW will begin bed search.  Vickii PennaGina Lameeka Schleifer, LCSWA [phone]  Psychiatric & Orthopedics (5N 1-8) Clinical Social Worker

## 2014-07-11 NOTE — Progress Notes (Addendum)
Patient states she woke up this morning with thoughts of hurting herself, "I just want this to be over with", charge RN notified, MD paged.  Patient will have a suicide sitter.  Hermina BartersBOWMAN, Tamla Winkels M, RN

## 2014-07-11 NOTE — Consult Note (Addendum)
[place] Psychiatry Consult   Reason for Consult:  Cocaine abuse and intoxication, depression Referring Physician:  DR. Aundra Dubin Patient Identification: Shirley Short MRN:  [id number] Principal Diagnosis: Psychoactive substance-induced mood disorder Diagnosis:   Patient Active Problem List   Diagnosis Date Noted  . Chest pain [R07.9] 07/07/2014  . Chronic pain disorder [G89.4] 05/31/2014  . MDD (major depressive disorder) [F32.2] 05/31/2014  . Abscess of right hand [L02.511]   . CAD (coronary artery disease) [I25.10]   . MDD (major depressive disorder), recurrent, severe, with psychosis [F33.3] 05/29/2014  . Cocaine abuse [F14.10] 05/23/2014  . CAD (coronary artery disease), native coronary artery [I25.10] 05/23/2014  . Obesity (BMI 30-39.9) [E66.9] 05/23/2014  . COPD (chronic obstructive pulmonary disease) [J44.9] 05/23/2014  . Hyperlipidemia [E78.5] 05/23/2014  . DM2 (diabetes mellitus, type 2) [E11.9]   . Noncompliance [Z91.19]   . Tobacco abuse [Z72.0]   . Hypertension [I10]     Total Time spent with patient: 1 hour  Subjective:   Shirley Short is a 54 y.o. female patient admitted with depression, suicidal ideation and substance abuse recent relapse.  HPI:  Shirley Short is a 54 years old female admitted to [facility] from [facility] for increased acute chest pain probably secondary to cocaine intoxication. Patient has similar clinical presentation about a month ago. Patient reported she has been staying with her roommate in [place]. Patient has been relapsed on drug of abuse which caused acute chest pain. Patient has been partially compliant with her medication management. Patient reported she has been depressed and endorses suicidal ideation. Patient has history of suicidal attempt by overdosing on Seroquel in the past. Patient has been seeing a psychiatric provider at [facility]. Patient does not contract for  safety at this time and willing to be placed in psychiatric inpatient facility. Patient also wanted to go to substance abuse inpatient rehabilitation when medically and psychiatrically stable. Patient has no evidence of psychosis. Patient reported she was stressed about her family not being supportive to her. Patient was recently admitted to behavioral health Hospital about a month ago but could not stay more than 2 days because of swollen hand.  Medical history: 54 yo with history of CAD and cocaine abuse, chronic pain, DMII, chronic LBBB, COPD, and HTN was transferred from [facility] to [facility] for evaluation of chest pain. Patient is an active abuser of cocaine and smokes cigarettes. She was admitted to [facility] in 2010 with cocaine-induced CP and had cath showing distal LAD disease. She was admitted in 2/16 to [facility] with chest pain in the setting of cocaine use. Troponin was 0.1, cardiac cath showed moderate disease (see PMH section below). She was managed medically. Rise in troponin thought to be vasospasm in an already diseased artery. During this hospitalization, she reported suicidal ideation and was sent to [facility]. She was discharged from [place] in 3/16. She started using cocaine again. She went to [facility] ER on 4/13 with chest pain radiating to her neck while watching television. ECG showed chronic LBBB. TnI 0.02. She was admitted to [facility]. She was then transferred down here tonight.   Unfortunately, [facility] did not send a discharge summary. I only have the troponin drawn initially when she arrived to the [facility] ER. This was negative. Cocaine was positive in urine. Prior to sending her down from [facility] to [facility], she was given 3 mg IV Dilaudid. She is, therefore, lethargic and only wakes up to  say a few words and falls back asleep. From what I can gather, she has rather diffuse pain throughout her chest and abdomen. This has been present  apparently since she arrived to [place].   Review of systems complete and found to be negative unless listed above   HPI Elements:   Location:  Substance abuse and depression. Quality:  Poor. Severity:  Chest pain and suicidal ideation. Timing:  Cocaine intoxication. Duration:  Few months. Context:  Psychosocial stressors.  Past Medical History:  Past Medical History  Diagnosis Date  . CAD (coronary artery disease)     a. s/p LHC on 05/2014 admission with non obst dz. Rx medically.   . Insulin dependent diabetes mellitus   . Noncompliance   . Obesity (BMI 30-39.9)   . Hyperlipidemia   . Hypertension   . Cocaine abuse   . Major depressive disorder   . Suicidal ideation   . Abscess of right hand     a. s/p ID on 05/2014 admission     Past Surgical History  Procedure Laterality Date  . Appendectomy    . Abdominal hysterectomy    . Cholecystectomy    . Left heart catheterization with coronary angiogram N/A 05/23/2014    Procedure: LEFT HEART CATHETERIZATION WITH CORONARY ANGIOGRAM;  Surgeon: Lorretta Harp, MD;  Location: [facility] CATH LAB;  Service: Cardiovascular;  Laterality: N/A;  . I&d extremity Right 05/25/2014    Procedure: IRRIGATION AND DEBRIDEMENT EXTREMITY;  Surgeon: Charlotte Crumb, MD;  Location: [place];  Service: Orthopedics;  Laterality: Right;   Family History: History reviewed. No pertinent family history. Social History:  History  Alcohol Use No     History  Drug Use  . Yes  . Special: Cocaine    Comment: Cocaine    History   Social History  . Marital Status: Divorced    Spouse Name: N/A  . Number of Children: N/A  . Years of Education: N/A   Social History Main Topics  . Smoking status: Current Every Day Smoker -- 1.50 packs/day for 30 years    Types: Cigarettes  . Smokeless tobacco: Never Used  . Alcohol Use: No  . Drug Use: Yes    Special: Cocaine     Comment: Cocaine  . Sexual Activity: Not on file   Other Topics Concern  . None   Social  History Narrative   Additional Social History: Patient has a daughter with her grandson lives in [place]:   Allergies  Allergen Reactions  . Compazine [Prochlorperazine Edisylate] Other (See Comments)  . Quinolones Swelling and Hives  . Levofloxacin Hives and Swelling  . Prochlorperazine Other (See Comments)    "draws face to one side"  . Avelox [Moxifloxacin Hcl In Nacl]   . Levaquin [Levofloxacin In D5w]   . Prochlorperazine Maleate Other (See Comments)    Draws up her face per the patient like a stroke    Labs:  Results for orders placed or performed during the hospital encounter of 07/07/14 (from the past 48 hour(s))  Glucose, capillary     Status: Abnormal   Collection Time: 07/09/14  4:14 PM  Result Value Ref Range   Glucose-Capillary 235 (H) 70 - 99 mg/dL  Glucose, capillary     Status: Abnormal   Collection Time: 07/10/14  6:46 AM  Result Value Ref Range  Glucose-Capillary 173 (H) 70 - 99 mg/dL   Comment 1 Notify RN    Comment 2 Document in Chart   Glucose, capillary     Status: Abnormal   Collection Time: 07/10/14 11:16 AM  Result Value Ref Range   Glucose-Capillary 158 (H) 70 - 99 mg/dL  Glucose, capillary     Status: Abnormal   Collection Time: 07/10/14  4:25 PM  Result Value Ref Range   Glucose-Capillary 183 (H) 70 - 99 mg/dL  Glucose, capillary     Status: Abnormal   Collection Time: 07/10/14  8:54 PM  Result Value Ref Range   Glucose-Capillary 211 (H) 70 - 99 mg/dL  Glucose, capillary     Status: Abnormal   Collection Time: 07/11/14  5:54 AM  Result Value Ref Range   Glucose-Capillary 289 (H) 70 - 99 mg/dL  Basic metabolic panel     Status: Abnormal   Collection Time: 07/11/14  7:16 AM  Result Value Ref Range   Sodium 137 135 - 145 mmol/L   Potassium 4.8 3.5 - 5.1 mmol/L   Chloride 103 96 - 112 mmol/L   CO2 27 19 - 32 mmol/L   Glucose, Bld 262 (H) 70 - 99 mg/dL   BUN 12 6 - 23 mg/dL    Creatinine, Ser 0.71 0.50 - 1.10 mg/dL   Calcium 9.5 8.4 - 10.5 mg/dL   GFR calc non Af Amer >90 >90 mL/min   GFR calc Af Amer >90 >90 mL/min    Comment: (NOTE) The eGFR has been calculated using the CKD EPI equation. This calculation has not been validated in all clinical situations. eGFR's persistently <90 mL/min signify possible Chronic Kidney Disease.    Anion gap 7 5 - 15  CBC     Status: Abnormal   Collection Time: 07/11/14  7:16 AM  Result Value Ref Range   WBC 6.9 4.0 - 10.5 K/uL   RBC 4.27 3.87 - 5.11 MIL/uL   Hemoglobin 11.9 (L) 12.0 - 15.0 g/dL   HCT 36.6 36.0 - 46.0 %   MCV 85.7 78.0 - 100.0 fL   MCH 27.9 26.0 - 34.0 pg   MCHC 32.5 30.0 - 36.0 g/dL   RDW 14.0 11.5 - 15.5 %   Platelets 128 (L) 150 - 400 K/uL  Glucose, capillary     Status: Abnormal   Collection Time: 07/11/14 11:46 AM  Result Value Ref Range   Glucose-Capillary 238 (H) 70 - 99 mg/dL    Vitals: Blood pressure 117/52, pulse 64, temperature 97.9 F (36.6 C), temperature source Oral, resp. rate 18, height 5' 4"  (1.626 m), weight 85.3 kg (188 lb 0.8 oz), SpO2 90 %.  Risk to Self: Is patient at risk for suicide?: No Risk to Others:   Prior Inpatient Therapy:   Prior Outpatient Therapy:    Current Facility-Administered Medications  Medication Dose Route Frequency Provider Last Rate Last Dose  . 0.9 %  sodium chloride infusion  250 mL Intravenous PRN Larey Dresser, MD 10 mL/hr at 07/07/14 2323 250 mL at 07/07/14 2323  . acetaminophen (TYLENOL) tablet 650 mg  650 mg Oral Q4H PRN Larey Dresser, MD      . ALPRAZolam Duanne Moron) tablet 1 mg  1 mg Oral TID PRN Sanda Klein, MD   1 mg at 07/11/14 0909  . amLODipine (NORVASC) tablet 5 mg  5 mg Oral Daily Mihai Croitoru, MD   5 mg at 07/11/14 1018  . antiseptic oral rinse (CPC / CETYLPYRIDINIUM CHLORIDE  0.05%) solution 7 mL  7 mL Mouth Rinse BID Larey Dresser, MD   7 mL at 07/11/14 1021  . aspirin EC tablet 81 mg  81 mg Oral Daily Larey Dresser, MD   81  mg at 07/11/14 1019  . atorvastatin (LIPITOR) tablet 80 mg  80 mg Oral q1800 Larey Dresser, MD   80 mg at 07/10/14 1647  . docusate sodium (COLACE) capsule 100 mg  100 mg Oral BID PRN Brittainy Erie Noe, PA-C      . DULoxetine (CYMBALTA) DR capsule 20 mg  20 mg Oral Daily Mihai Croitoru, MD   20 mg at 07/11/14 1019  . gabapentin (NEURONTIN) tablet 600 mg  600 mg Oral TID Larey Dresser, MD   600 mg at 07/11/14 1018  . heparin injection 5,000 Units  5,000 Units Subcutaneous 3 times per day Larey Dresser, MD   5,000 Units at 07/11/14 1324  . HYDROcodone-acetaminophen (NORCO/VICODIN) 5-325 MG per tablet 1-2 tablet  1-2 tablet Oral Q4H PRN Larey Dresser, MD   2 tablet at 07/11/14 1235  . ibuprofen (ADVIL,MOTRIN) tablet 400 mg  400 mg Oral Q6H PRN Mihai Croitoru, MD      . insulin aspart (novoLOG) injection 0-15 Units  0-15 Units Subcutaneous TID WC Larey Dresser, MD   5 Units at 07/11/14 1252  . insulin detemir (LEVEMIR) injection 20 Units  20 Units Subcutaneous QHS Larey Dresser, MD   20 Units at 07/10/14 2142  . isosorbide mononitrate (IMDUR) 24 hr tablet 30 mg  30 mg Oral Daily Mihai Croitoru, MD   30 mg at 07/11/14 1019  . lisinopril (PRINIVIL,ZESTRIL) tablet 20 mg  20 mg Oral Daily Mihai Croitoru, MD   20 mg at 07/11/14 1018  . metFORMIN (GLUCOPHAGE) tablet 500 mg  500 mg Oral Q breakfast Mihai Croitoru, MD   500 mg at 07/11/14 8889  . nicotine (NICODERM CQ - dosed in mg/24 hours) patch 21 mg  21 mg Transdermal Daily Larey Dresser, MD   21 mg at 07/11/14 1019  . nitroGLYCERIN (NITROSTAT) SL tablet 0.4 mg  0.4 mg Sublingual Q5 Min x 3 PRN Larey Dresser, MD      . ondansetron [facility]) injection 4 mg  4 mg Intravenous Q6H PRN Larey Dresser, MD      . pantoprazole (PROTONIX) EC tablet 40 mg  40 mg Oral Daily Larey Dresser, MD   40 mg at 07/11/14 1019  . rOPINIRole (REQUIP) tablet 0.25 mg  0.25 mg Oral QHS Mihai Croitoru, MD   0.25 mg at 07/10/14 2142  . sodium chloride 0.9 %  injection 3 mL  3 mL Intravenous Q12H Larey Dresser, MD   3 mL at 07/09/14 1251  . sodium chloride 0.9 % injection 3 mL  3 mL Intravenous PRN Larey Dresser, MD      . traZODone (DESYREL) tablet 300 mg  300 mg Oral QID Sanda Klein, MD   300 mg at 07/11/14 1324    Musculoskeletal: Strength & Muscle Tone: decreased Gait & Station: unable to stand Patient leans: N/A  Psychiatric Specialty Exam: Physical Exam as per history and physical   ROS depression, anxiety and suicidal ideation and drug-seeking behavior   Blood pressure 117/52, pulse 64, temperature 97.9 F (36.6 C), temperature source Oral, resp. rate 18, height 5' 4"  (1.626 m), weight 85.3 kg (188 lb 0.8 oz), SpO2 90 %.Body mass index is 32.26 kg/(m^2).  General Appearance: Casual  Eye Contact::  Good  Speech:  Clear and Coherent  Volume:  Decreased  Mood:  Anxious and Depressed  Affect:  Appropriate and Congruent  Thought Process:  Coherent and Goal Directed  Orientation:  Full (Time, Place, and Person)  Thought Content:  WDL  Suicidal Thoughts:  Yes.  without intent/plan  Homicidal Thoughts:  No  Memory:  Immediate;   Fair Recent;   Fair  Judgement:  Impaired  Insight:  Lacking  Psychomotor Activity:  Decreased  Concentration:  Good  Recall:  Good  Fund of Knowledge:Good  Language: Good  Akathisia:  Negative  Handed:  Right  AIMS (if indicated):     Assets:  Communication Skills Desire for Improvement Financial Resources/Insurance Housing Leisure Time Resilience  ADL's:  Impaired  Cognition: WNL  Sleep:      Medical Decision Making: New problem, with additional work up planned, Review of Psycho-Social Stressors (1), Review or order clinical lab tests (1), Established Problem, Worsening (2), Review or order medicine tests (1), Review of Medication Regimen & Side Effects (2) and Review of New Medication or Change in Dosage (2)  Treatment Plan Summary: Daily contact with patient to assess and evaluate  symptoms and progress in treatment and Medication management  Plan:  Discontinue Xanax due to substance abuse and drug-seeking behavior May use lorazepam intramuscular every 6 hours when necessary for anxiety and seizures Will increase Neurontin 800 mg 3 times daily Will increase Cymbalta 40 mg daily for depression Change to trazodone 300 mg at bedtime Recommend psychiatric Inpatient admission when medically cleared. Supportive therapy provided about ongoing stressors. Appreciate psychiatric consultation and follow up as clinically required Please contact [phone] or [phone] if needs further assistance  Disposition: Refer to the psychiatric social service for appropriate disposition plans including placement.  Baine Decesare,JANARDHAHA R. 07/11/2014 1:29 PM

## 2014-07-11 NOTE — Progress Notes (Signed)
Inpatient Diabetes Program Recommendations  AACE/ADA: New Consensus Statement on Inpatient Glycemic Control (2013)  Target Ranges:  Prepandial:   less than 140 mg/dL      Peak postprandial:   less than 180 mg/dL (1-2 hours)      Critically ill patients:  140 - 180 mg/dL   Reason for Assessment:  Results for Shirley Short (MRN [id number]) as of 07/11/2014 11:00  Ref. Range 07/10/2014 06:46 07/10/2014 11:16 07/10/2014 16:25 07/10/2014 20:54 07/11/2014 05:54  Glucose-Capillary Latest Ref Range: 70-99 mg/dL 409173 (H) 811158 (H) 914183 (H) 211 (H) 289 (H)  Results for Shirley Short (MRN [id number]) as of 07/11/2014 11:00  Ref. Range 07/08/2014 00:25  Hemoglobin A1C Latest Ref Range: 4.8-5.6 % 11.4 (H)   Diabetes history: Type 2 diabetes Outpatient Diabetes medications: Levemir 20 units daily, Lantus 40 units daily, Novolin regular SSI tid with meals. Current orders for Inpatient glycemic control:  Novolog moderate tid with meals, Levemir 20 units daily  Note that A1C is improved since March , 2016.  Unclear why patient would be on both Levemir and Lantus at home. Patient states that she is taking both types of insulin at home.  Explained that Levemir and Lantus are similar and that she likely needs to only be on one or the other.  Please consider discontinuation of Levemir and restart Lantus 40 units daily.    Shirley MeagerJenny Jeanenne Licea, RN, BC-ADM Inpatient Diabetes Coordinator Pager [phone] (8a-5p)

## 2014-07-11 NOTE — Progress Notes (Signed)
Patient Profile: 54 y/o with history of CAD and cocaine abuse, chronic pain, DMII, chronic LBBB, COPD, and HTN was transferred from [facility] to [facility] for evaluation of chest pain. UDS + for cocaine.   Subjective: Resting. Still notes chronic pain.   Objective: Vital signs in last 24 hours: Temp:  [97.7 F (36.5 C)-98 F (36.7 C)] 97.9 F (36.6 C) (04/18 0454) Pulse Rate:  [64-67] 64 (04/18 0454) Resp:  [18] 18 (04/18 0454) BP: (111-134)/(40-53) 111/40 mmHg (04/18 0454) SpO2:  [90 %-95 %] 90 % (04/18 0454) Last BM Date:  (patient in unsure of last BM)  Intake/Output from previous day: 04/17 0701 - 04/18 0700 In: 600 [P.O.:600] Out: -  Intake/Output this shift: Total I/O In: 636 [P.O.:636] Out: -   Medications Current Facility-Administered Medications  Medication Dose Route Frequency Provider Last Rate Last Dose  . 0.9 %  sodium chloride infusion  250 mL Intravenous PRN Laurey Morale, MD 10 mL/hr at 07/07/14 2323 250 mL at 07/07/14 2323  . acetaminophen (TYLENOL) tablet 650 mg  650 mg Oral Q4H PRN Laurey Morale, MD      . ALPRAZolam Prudy Feeler) tablet 1 mg  1 mg Oral TID PRN Thurmon Fair, MD   1 mg at 07/11/14 0909  . amLODipine (NORVASC) tablet 5 mg  5 mg Oral Daily Mihai Croitoru, MD   5 mg at 07/10/14 1047  . antiseptic oral rinse (CPC / CETYLPYRIDINIUM CHLORIDE 0.05%) solution 7 mL  7 mL Mouth Rinse BID Laurey Morale, MD   7 mL at 07/10/14 1000  . aspirin EC tablet 81 mg  81 mg Oral Daily Laurey Morale, MD   81 mg at 07/10/14 1044  . atorvastatin (LIPITOR) tablet 80 mg  80 mg Oral q1800 Laurey Morale, MD   80 mg at 07/10/14 1647  . DULoxetine (CYMBALTA) DR capsule 20 mg  20 mg Oral Daily Mihai Croitoru, MD   20 mg at 07/10/14 1044  . gabapentin (NEURONTIN) tablet 600 mg  600 mg Oral TID Laurey Morale, MD   600 mg at 07/10/14 2142  . heparin injection 5,000 Units  5,000 Units Subcutaneous 3 times per day Laurey Morale, MD   5,000 Units at 07/10/14  2142  . HYDROcodone-acetaminophen (NORCO/VICODIN) 5-325 MG per tablet 1-2 tablet  1-2 tablet Oral Q4H PRN Laurey Morale, MD   2 tablet at 07/11/14 [phone]  . ibuprofen (ADVIL,MOTRIN) tablet 400 mg  400 mg Oral Q6H PRN Mihai Croitoru, MD      . insulin aspart (novoLOG) injection 0-15 Units  0-15 Units Subcutaneous TID WC Laurey Morale, MD   8 Units at 07/11/14 [phone]  . insulin detemir (LEVEMIR) injection 20 Units  20 Units Subcutaneous QHS Laurey Morale, MD   20 Units at 07/10/14 2142  . isosorbide mononitrate (IMDUR) 24 hr tablet 30 mg  30 mg Oral Daily Mihai Croitoru, MD   30 mg at 07/10/14 1043  . lisinopril (PRINIVIL,ZESTRIL) tablet 20 mg  20 mg Oral Daily Mihai Croitoru, MD   20 mg at 07/10/14 1047  . metFORMIN (GLUCOPHAGE) tablet 500 mg  500 mg Oral Q breakfast Mihai Croitoru, MD   500 mg at 07/11/14 4782  . nicotine (NICODERM CQ - dosed in mg/24 hours) patch 21 mg  21 mg Transdermal Daily Laurey Morale, MD   21 mg at 07/10/14 1205  . nitroGLYCERIN (NITROSTAT) SL tablet 0.4 mg  0.4 mg Sublingual Q5 Min x 3  PRN Laurey Moralealton S McLean, MD      . ondansetron [facility](ZOFRAN) injection 4 mg  4 mg Intravenous Q6H PRN Laurey Moralealton S McLean, MD      . pantoprazole (PROTONIX) EC tablet 40 mg  40 mg Oral Daily Laurey Moralealton S McLean, MD   40 mg at 07/10/14 1041  . rOPINIRole (REQUIP) tablet 0.25 mg  0.25 mg Oral QHS Mihai Croitoru, MD   0.25 mg at 07/10/14 2142  . senna-docusate (Senokot-S) tablet 1 tablet  1 tablet Oral QHS PRN Laurey Moralealton S McLean, MD   1 tablet at 07/10/14 2142  . sodium chloride 0.9 % injection 3 mL  3 mL Intravenous Q12H Laurey Moralealton S McLean, MD   3 mL at 07/09/14 1251  . sodium chloride 0.9 % injection 3 mL  3 mL Intravenous PRN Laurey Moralealton S McLean, MD      . traZODone (DESYREL) tablet 300 mg  300 mg Oral QID Thurmon FairMihai Croitoru, MD   300 mg at 07/10/14 2142    PE: General appearance: alert, cooperative and no distress Neck: no carotid bruit and no JVD Lungs: clear to auscultation bilaterally Heart: regular rate and  rhythm, S1, S2 normal, no murmur, click, rub or gallop Extremities: no LEE Pulses: 2+ and symmetric Skin: warm and dry Neurologic: Grossly normal   Tele :  SR 60s   Lab Results:   Recent Labs  07/11/14 0716  WBC 6.9  HGB 11.9*  HCT 36.6  PLT 128*   BMET  Recent Labs  07/11/14 0716  NA 137  K 4.8  CL 103  CO2 27  GLUCOSE 262*  BUN 12  CREATININE 0.71  CALCIUM 9.5   PT/INR No results for input(s): LABPROT, INR in the last 72 hours. Cholesterol No results for input(s): CHOL in the last 72 hours. Cardiac Panel (last 3 results)  Recent Labs  07/08/14 1103  TROPONINI <0.03    Assessment/Plan  Active Problems:   Chest pain   1. Chest Pain Patient has known, moderate CAD by recent cardiac catheterization, and also recent cocaine abuse. CP episode likely secondary to coronary spasm. Chest pain, presently resolved without clear evidence of ACS by cardiac enzymes. Continue medical therapy for underlying moderate CAD with avoidance of BB given recurrent cocaine abuse.   2. Chronic LBBB:  3. Chronic Pain: Chronic pain with history of opiate abuse. Patient is on Neurontin, Lyrica, trazodone, possibly other pain medications as an outpatient. She states that she does not have a primary care provider - previous one was at a facility in [place]. Recent admission to behavioral health noted. Will ask Case Management to assist with disposition/ PCP/ behavioral health follow-up.   4. Cardiomyopathy: with chronic systolic heart failure, medically stable. LVEF 25-30% by recent echocardiogram. On ACE inhibitor but no beta blocker with cocaine abuse.  5. Tobacco and Cocaine Use: recurring. cessation strongly advised.   6. Suicidal Ideation: Patient reported thoughts of hurting herself. She now has a Comptrollersitter.  She had suicidal ideation during last hospitalization and required psych consult. Will ask them to reassess and help with her meds.      LOS: 4 days    Brittainy M.  Sharol HarnessSimmons, PA-C 07/11/2014 9:30 AM  Patinet seen and examined  I agree with findings as noted above by B Simmons.  Patinet is resting comfortably.   Would continue medical Rx for CP and chronic systolic CHF  Volume OK  WIll have psychiatry see patient    Dietrich Patesaula Jiyaan Steinhauser

## 2014-07-11 NOTE — Progress Notes (Addendum)
Patient states when she transferred [facility] she had blue jeans, blue shirt, black flip flops. The belongings are not in the patients room. Will check with previous unit and try to find patient belongings.  Called [facility] about patient belongings; Diplomatic Services operational officersecretary will call back.  Hermina BartersBOWMAN, Kylar Leonhardt M, RN

## 2014-07-12 ENCOUNTER — Inpatient Hospital Stay (HOSPITAL_COMMUNITY)
Admission: AD | Admit: 2014-07-12 | Discharge: 2014-07-15 | DRG: 885 | Disposition: A | Discharge status: Home / Self Care | Payer: Federal, State, Local not specified - Other | Source: Intra-hospital | Attending: Emergency Medicine | Admitting: Emergency Medicine

## 2014-07-12 ENCOUNTER — Encounter (HOSPITAL_COMMUNITY): Payer: Self-pay | Admitting: Behavioral Health

## 2014-07-12 DIAGNOSIS — Z9119 Patient's noncompliance with other medical treatment and regimen: Secondary | ICD-10-CM | POA: Diagnosis present

## 2014-07-12 DIAGNOSIS — L03818 Cellulitis of other sites: Secondary | ICD-10-CM | POA: Insufficient documentation

## 2014-07-12 DIAGNOSIS — Z794 Long term (current) use of insulin: Secondary | ICD-10-CM | POA: Diagnosis not present

## 2014-07-12 DIAGNOSIS — R45851 Suicidal ideations: Secondary | ICD-10-CM | POA: Diagnosis present

## 2014-07-12 DIAGNOSIS — F1414 Cocaine abuse with cocaine-induced mood disorder: Secondary | ICD-10-CM | POA: Diagnosis not present

## 2014-07-12 DIAGNOSIS — Z7982 Long term (current) use of aspirin: Secondary | ICD-10-CM

## 2014-07-12 DIAGNOSIS — F0630 Mood disorder due to known physiological condition, unspecified: Secondary | ICD-10-CM

## 2014-07-12 DIAGNOSIS — F17210 Nicotine dependence, cigarettes, uncomplicated: Secondary | ICD-10-CM | POA: Diagnosis present

## 2014-07-12 DIAGNOSIS — M869 Osteomyelitis, unspecified: Secondary | ICD-10-CM

## 2014-07-12 DIAGNOSIS — R079 Chest pain, unspecified: Secondary | ICD-10-CM | POA: Diagnosis not present

## 2014-07-12 DIAGNOSIS — F1994 Other psychoactive substance use, unspecified with psychoactive substance-induced mood disorder: Secondary | ICD-10-CM | POA: Diagnosis not present

## 2014-07-12 DIAGNOSIS — E119 Type 2 diabetes mellitus without complications: Secondary | ICD-10-CM

## 2014-07-12 DIAGNOSIS — L02519 Cutaneous abscess of unspecified hand: Secondary | ICD-10-CM | POA: Diagnosis present

## 2014-07-12 DIAGNOSIS — L03119 Cellulitis of unspecified part of limb: Secondary | ICD-10-CM | POA: Diagnosis not present

## 2014-07-12 DIAGNOSIS — R0789 Other chest pain: Secondary | ICD-10-CM | POA: Diagnosis not present

## 2014-07-12 DIAGNOSIS — I2510 Atherosclerotic heart disease of native coronary artery without angina pectoris: Secondary | ICD-10-CM | POA: Diagnosis present

## 2014-07-12 DIAGNOSIS — Z79899 Other long term (current) drug therapy: Secondary | ICD-10-CM | POA: Diagnosis not present

## 2014-07-12 DIAGNOSIS — F332 Major depressive disorder, recurrent severe without psychotic features: Secondary | ICD-10-CM | POA: Diagnosis not present

## 2014-07-12 DIAGNOSIS — M79641 Pain in right hand: Secondary | ICD-10-CM | POA: Diagnosis present

## 2014-07-12 DIAGNOSIS — F329 Major depressive disorder, single episode, unspecified: Secondary | ICD-10-CM | POA: Diagnosis not present

## 2014-07-12 DIAGNOSIS — I25119 Atherosclerotic heart disease of native coronary artery with unspecified angina pectoris: Secondary | ICD-10-CM | POA: Diagnosis not present

## 2014-07-12 DIAGNOSIS — M779 Enthesopathy, unspecified: Secondary | ICD-10-CM

## 2014-07-12 DIAGNOSIS — I10 Essential (primary) hypertension: Secondary | ICD-10-CM | POA: Diagnosis present

## 2014-07-12 LAB — GLUCOSE, CAPILLARY
GLUCOSE-CAPILLARY: 181 mg/dL — AB (ref 70–99)
GLUCOSE-CAPILLARY: 295 mg/dL — AB (ref 70–99)
Glucose-Capillary: 288 mg/dL — ABNORMAL HIGH (ref 70–99)
Glucose-Capillary: 226 mg/dL — ABNORMAL HIGH (ref 70–99)

## 2014-07-12 MED ORDER — INSULIN GLARGINE 100 UNIT/ML ~~LOC~~ SOLN
40.0000 [IU] | Freq: Every day | SUBCUTANEOUS | Status: DC
  Administered 2014-07-13: 40 [IU] via SUBCUTANEOUS
  Filled 2014-07-12: qty 0.4

## 2014-07-12 MED ORDER — SENNOSIDES-DOCUSATE SODIUM 8.6-50 MG PO TABS
1.0000 | ORAL_TABLET | Freq: Every evening | ORAL | Status: DC | PRN
  Filled 2014-07-12: qty 1

## 2014-07-12 MED ORDER — GABAPENTIN 400 MG PO CAPS: 800.0000 mg | ORAL_CAPSULE | Freq: Three times a day (TID) | ORAL | Status: DC

## 2014-07-12 MED ORDER — AMLODIPINE BESYLATE 5 MG PO TABS
5.0000 mg | ORAL_TABLET | Freq: Every day | ORAL | Status: DC
  Administered 2014-07-13 – 2014-07-15 (×3): 5 mg via ORAL
  Filled 2014-07-12 (×7): qty 1

## 2014-07-12 MED ORDER — DIPHENHYDRAMINE HCL 25 MG PO CAPS
25.0000 mg | ORAL_CAPSULE | Freq: Once | ORAL | Status: AC
  Administered 2014-07-12: 25 mg via ORAL
  Filled 2014-07-12: qty 1

## 2014-07-12 MED ORDER — LORATADINE 10 MG PO TABS
10.0000 mg | ORAL_TABLET | Freq: Every day | ORAL | Status: DC
  Administered 2014-07-13 – 2014-07-15 (×3): 10 mg via ORAL
  Filled 2014-07-12 (×8): qty 1

## 2014-07-12 MED ORDER — NITROGLYCERIN 0.4 MG SL SUBL: 0.4000 mg | SUBLINGUAL_TABLET | SUBLINGUAL | Status: DC | PRN

## 2014-07-12 MED ORDER — HYDROCHLOROTHIAZIDE 25 MG PO TABS
25.0000 mg | ORAL_TABLET | ORAL | Status: DC
  Administered 2014-07-13 – 2014-07-15 (×2): 25 mg via ORAL
  Filled 2014-07-12 (×7): qty 1

## 2014-07-12 MED ORDER — LISINOPRIL 20 MG PO TABS
20.0000 mg | ORAL_TABLET | Freq: Every day | ORAL | Status: DC
  Administered 2014-07-13 – 2014-07-15 (×3): 20 mg via ORAL
  Filled 2014-07-12 (×7): qty 1

## 2014-07-12 MED ORDER — GABAPENTIN 400 MG PO CAPS
800.0000 mg | ORAL_CAPSULE | Freq: Three times a day (TID) | ORAL | Status: DC
  Administered 2014-07-13 – 2014-07-15 (×9): 800 mg via ORAL
  Filled 2014-07-12 (×18): qty 2

## 2014-07-12 MED ORDER — PANTOPRAZOLE SODIUM 40 MG PO TBEC
40.0000 mg | DELAYED_RELEASE_TABLET | Freq: Every day | ORAL | Status: DC
  Administered 2014-07-13 – 2014-07-15 (×3): 40 mg via ORAL
  Filled 2014-07-12 (×7): qty 1

## 2014-07-12 MED ORDER — ASPIRIN EC 81 MG PO TBEC
81.0000 mg | DELAYED_RELEASE_TABLET | Freq: Every day | ORAL | Status: DC
  Administered 2014-07-13 – 2014-07-15 (×3): 81 mg via ORAL
  Filled 2014-07-12 (×7): qty 1

## 2014-07-12 MED ORDER — TRAZODONE HCL 150 MG PO TABS
300.0000 mg | ORAL_TABLET | Freq: Every day | ORAL | Status: DC
  Administered 2014-07-12: 300 mg via ORAL
  Filled 2014-07-12 (×4): qty 2

## 2014-07-12 MED ORDER — METFORMIN HCL 500 MG PO TABS
500.0000 mg | ORAL_TABLET | Freq: Every day | ORAL | Status: DC
  Administered 2014-07-13 – 2014-07-15 (×3): 500 mg via ORAL
  Filled 2014-07-12 (×7): qty 1

## 2014-07-12 MED ORDER — PREGABALIN 100 MG PO CAPS: 300.0000 mg | ORAL_CAPSULE | ORAL | Status: DC | PRN

## 2014-07-12 MED ORDER — ALUM & MAG HYDROXIDE-SIMETH 200-200-20 MG/5ML PO SUSP: 30.0000 mL | ORAL | Status: DC | PRN

## 2014-07-12 MED ORDER — IBUPROFEN 800 MG PO TABS
800.0000 mg | ORAL_TABLET | Freq: Three times a day (TID) | ORAL | Status: DC | PRN
  Administered 2014-07-12 – 2014-07-13 (×2): 800 mg via ORAL
  Filled 2014-07-12 (×2): qty 1

## 2014-07-12 MED ORDER — MAGNESIUM HYDROXIDE 400 MG/5ML PO SUSP: 30.0000 mL | Freq: Every day | ORAL | Status: DC | PRN

## 2014-07-12 MED ORDER — INSULIN ASPART 100 UNIT/ML ~~LOC~~ SOLN
0.0000 [IU] | Freq: Three times a day (TID) | SUBCUTANEOUS | Status: DC
  Administered 2014-07-13 (×2): 4 [IU] via SUBCUTANEOUS
  Administered 2014-07-13: 15 [IU] via SUBCUTANEOUS
  Administered 2014-07-14 (×3): 4 [IU] via SUBCUTANEOUS
  Administered 2014-07-15: 3 [IU] via SUBCUTANEOUS
  Administered 2014-07-15: 4 [IU] via SUBCUTANEOUS

## 2014-07-12 MED ORDER — AMITRIPTYLINE HCL 100 MG PO TABS: 100.0000 mg | ORAL_TABLET | Freq: Every day | ORAL | Status: DC

## 2014-07-12 MED ORDER — MOMETASONE FURO-FORMOTEROL FUM 100-5 MCG/ACT IN AERO
2.0000 | INHALATION_SPRAY | Freq: Two times a day (BID) | RESPIRATORY_TRACT | Status: DC
  Administered 2014-07-12 – 2014-07-15 (×5): 2 via RESPIRATORY_TRACT
  Filled 2014-07-12 (×2): qty 8.8

## 2014-07-12 MED ORDER — INSULIN ASPART 100 UNIT/ML ~~LOC~~ SOLN
4.0000 [IU] | Freq: Three times a day (TID) | SUBCUTANEOUS | Status: DC
Start: 2014-07-13 — End: 2014-07-15
  Administered 2014-07-13 – 2014-07-15 (×7): 4 [IU] via SUBCUTANEOUS

## 2014-07-12 MED ORDER — INSULIN ASPART 100 UNIT/ML ~~LOC~~ SOLN
0.0000 [IU] | Freq: Every day | SUBCUTANEOUS | Status: DC
  Administered 2014-07-12: 3 [IU] via SUBCUTANEOUS

## 2014-07-12 MED ORDER — ATORVASTATIN CALCIUM 80 MG PO TABS
80.0000 mg | ORAL_TABLET | Freq: Every day | ORAL | Status: DC
  Administered 2014-07-13 – 2014-07-14 (×2): 80 mg via ORAL
  Filled 2014-07-12 (×5): qty 1

## 2014-07-12 MED ORDER — NICOTINE 21 MG/24HR TD PT24
21.0000 mg | MEDICATED_PATCH | Freq: Every day | TRANSDERMAL | Status: DC
  Administered 2014-07-13 – 2014-07-15 (×3): 21 mg via TRANSDERMAL
  Filled 2014-07-12 (×7): qty 1

## 2014-07-12 MED ORDER — DULOXETINE HCL 20 MG PO CPEP
40.0000 mg | ORAL_CAPSULE | Freq: Every day | ORAL | Status: DC
  Administered 2014-07-13 – 2014-07-15 (×3): 40 mg via ORAL
  Filled 2014-07-12 (×8): qty 2

## 2014-07-12 MED ORDER — ROPINIROLE HCL 0.25 MG PO TABS
0.2500 mg | ORAL_TABLET | Freq: Every day | ORAL | Status: DC
  Administered 2014-07-12 – 2014-07-14 (×3): 0.25 mg via ORAL
  Filled 2014-07-12 (×9): qty 1

## 2014-07-12 MED ORDER — ISOSORBIDE MONONITRATE ER 30 MG PO TB24
30.0000 mg | ORAL_TABLET | Freq: Every day | ORAL | Status: DC
  Administered 2014-07-13 – 2014-07-15 (×3): 30 mg via ORAL
  Filled 2014-07-12 (×8): qty 1

## 2014-07-12 MED ORDER — PREGABALIN 100 MG PO CAPS
100.0000 mg | ORAL_CAPSULE | ORAL | Status: DC | PRN
  Administered 2014-07-13 – 2014-07-15 (×3): 100 mg via ORAL
  Filled 2014-07-12 (×3): qty 1

## 2014-07-12 MED ORDER — ACETAMINOPHEN 325 MG PO TABS
650.0000 mg | ORAL_TABLET | Freq: Four times a day (QID) | ORAL | Status: DC | PRN
  Administered 2014-07-13: 650 mg via ORAL
  Filled 2014-07-12: qty 2

## 2014-07-12 MED ORDER — TRAZODONE HCL 300 MG PO TABS: 300.0000 mg | ORAL_TABLET | Freq: Every day | ORAL | Status: DC

## 2014-07-12 NOTE — Discharge Summary (Signed)
Discharge Summary   Patient ID: Shirley Short MRN: [id number], DOB/AGE: 54-May-1962 54 y.o. Admit date: 07/07/2014 D/C date:     07/12/2014  Primary Cardiologist: Dr. Wyline Mood ( she has requested to follow up in [place])  Principal Problem:   Psychoactive substance-induced mood disorder Active Problems:   DM2 (diabetes mellitus, type 2)   Cocaine abuse   Noncompliance   Obesity (BMI 30-39.9)   COPD (chronic obstructive pulmonary disease)   Tobacco abuse   Hypertension   Hyperlipidemia   Chronic pain disorder   CAD (coronary artery disease)   MDD (major depressive disorder)   Chest pain    Admission Dates: 07/07/14-07/12/14 Discharge Diagnosis: Psychoactive substance-induced mood disorder and chest pain likely secondary to coronary spasm due to ongoing cocaine abuse  HPI: Shirley Short is a 54 y.o. female with a history of continued tobacco abuse, polysubstance abuse (cocaine/opiates), chronic pain syndrome, chronic LBBB, major depressive disorder, COPD, DM, HLD and HTN who was transferred from [facility] to [facility] for evaluation of chest pain.UDS + for cocaine.   Patient is an active abuser of cocaine and smokes cigarettes. She was admitted to [facility] in 2010 with cocaine-induced CP and had cath showing distal LAD disease. She was admitted in 2/16 to [facility] with chest pain in the setting of cocaine use. Troponin was 0.1, cardiac cath showed moderate disease (see PMH section below). She was managed medically. Rise in troponin thought to be vasospasm in an already diseased artery. During this hospitalization, she reported suicidal ideation and was sent to [facility]. She was discharged from Behavioral Health in 3/16. She started using cocaine again. She went to [facility] ER on 4/13 with chest pain radiating to her neck while watching television. ECG showed chronic LBBB. TnI 0.02. She was admitted to [facility]. She was then transferred to [facility] on  07/07/14.  Unfortunately, [facility] did not send a discharge summary.We were able to see the troponin drawn initially when she arrived to the [facility] ER, which was negative. Cocaine was positive in urine. Prior to sending her down from [facility] to [facility], she was given 3 mg IV Dilaudid. She was, therefore, lethargic and only woke up to say a few words and falls back asleep. She had rather diffuse pain throughout her chest and abdomen which had been present apparently since she arrived to [place].    Hospital Course  1. Chest Pain Patient has known, moderate CAD by recent cardiac catheterization, and also recent cocaine abuse. CP episode likely secondary to coronary spasm. Chest pain, presently resolved without clear evidence of ACS by cardiac enzymes. Continue medical therapy for underlying moderate CAD with avoidance of BB given recurrent cocaine abuse.  Chest pain today is increased with breath and with direct pressure to chest. It was felt to not be cardiac in nature   2. Chronic LBBB: stable  3. Chronic Pain: Chronic pain with history of opiate abuse. Patient is on Neurontin, Lyrica, trazodone, possibly other pain medications as an outpatient. She states that she does not have a primary care provider - previous one was at a facility in [place]. Recent admission to behavioral health noted. Case Management to assist with disposition/ PCP/ behavioral health follow-up.   4. Cardiomyopathy: with chronic systolic heart failure, medically stable. LVEF 25-30% by recent echocardiogram. On ACE inhibitor but no beta blocker with cocaine abuse.  5. Tobacco and Cocaine Use: recurring. Cessation strongly advised.   6. Psychoactive substance-induced mood disorder/Suicidal Ideation: Patient reported thoughts  of hurting herself. A sitter was placed. She had suicidal ideation during last hospitalization and required psych consult. They have seen her again in consultation and made multiple recommendations. She  was seen by Dr. Sharyne PeachJanardhana yesterday who made many medication adjustments and discontinued xanax due to substance abuse and drug-seeking behavior. Recommended inpatient psych when medically cleared.   Dispo- Will go to [facility] tonight.  The patient has had an uncomplicated hospital course and is recovering well. She has been seen by Dr. Tenny Crawoss today and deemed ready for discharge [facility]. Smoking cessation was disscussed in length. Discharge medications are listed below.  Medication recommendations by Dr. Sharyne PeachJanardhana: Discontinue Xanax due to substance abuse and drug-seeking behavior May use lorazepam intramuscular every 6 hours when necessary for anxiety and seizures Will increase Neurontin 800 mg 3 times daily Will increase Cymbalta 40 mg daily for depression Change to trazodone 300 mg at bedtime   Discharge Vitals: Blood pressure 114/44, pulse 74, temperature 97.8 F (36.6 C), temperature source Oral, resp. rate 20, height 5\' 4"  (1.626 m), weight 188 lb 0.8 oz (85.3 kg), SpO2 96 %.  Labs: Lab Results  Component Value Date   WBC 6.9 07/11/2014   HGB 11.9* 07/11/2014   HCT 36.6 07/11/2014   MCV 85.7 07/11/2014   PLT 128* 07/11/2014     Recent Labs Lab 07/08/14 0025 07/11/14 0716  NA 134*  134* 137  K 4.0  3.9 4.8  CL 104  104 103  CO2 22  21 27   BUN 18  18 12   CREATININE 1.08  1.07 0.71  CALCIUM 8.7  8.6 9.5  PROT 5.6*  --   BILITOT 0.3  --   ALKPHOS 119*  --   ALT 144*  --   AST 190*  --   GLUCOSE 102*  103* 262*    Lab Results  Component Value Date   CHOL 215* 05/24/2014   HDL 46 05/24/2014   LDLCALC 140* 05/24/2014   TRIG 145 05/24/2014     Diagnostic Studies/Procedures    None   Discharge Medications     Medication List    STOP taking these medications        ALPRAZolam 1 MG tablet  Commonly known as:  XANAX     VYVANSE 40 MG capsule  Generic drug:  lisdexamfetamine      TAKE these medications         amitriptyline 100 MG tablet  Commonly known as:  ELAVIL  Take 1 tablet by mouth at bedtime.     amLODipine 5 MG tablet  Commonly known as:  NORVASC  Take 1 tablet (5 mg total) by mouth daily.     aspirin 81 MG EC tablet  Take 1 tablet (81 mg total) by mouth daily.     atorvastatin 80 MG tablet  Commonly known as:  LIPITOR  Take 1 tablet (80 mg total) by mouth daily at 6 PM.     doxycycline 100 MG tablet  Commonly known as:  VIBRA-TABS  Take 1 tablet (100 mg total) by mouth every 12 (twelve) hours.     DULoxetine 20 MG capsule  Commonly known as:  CYMBALTA  Take 20 mg by mouth daily.     Fluticasone-Salmeterol 250-50 MCG/DOSE Aepb  Commonly known as:  ADVAIR  Inhale 1 puff into the lungs every 12 (twelve) hours.     gabapentin 400 MG capsule  Commonly known as:  NEURONTIN  Take 2 capsules (800 mg total)  by mouth 3 (three) times daily.     glucose blood test strip  1 Strip by Other route three times daily     hydrochlorothiazide 25 MG tablet  Commonly known as:  HYDRODIURIL  Take 25 mg by mouth every morning.     HYDROcodone-acetaminophen 5-325 MG per tablet  Commonly known as:  NORCO/VICODIN  Take 1-2 tablets by mouth every 4 (four) hours as needed for moderate pain.     ibuprofen 800 MG tablet  Commonly known as:  ADVIL,MOTRIN  Take 800 mg by mouth 3 (three) times daily as needed for mild pain.     insulin detemir 100 UNIT/ML injection  Commonly known as:  LEVEMIR  Inject 0.2 mLs (20 Units total) into the skin at bedtime.     insulin glargine 100 UNIT/ML injection  Commonly known as:  LANTUS  Inject 40 Units into the skin daily.     insulin regular 100 units/mL injection  Commonly known as:  NOVOLIN R,HUMULIN R  Inject into the skin 3 (three) times daily before meals. Sliding scale     isosorbide mononitrate 30 MG 24 hr tablet  Commonly known as:  IMDUR  Take 1 tablet (30 mg total) by mouth daily.     lisinopril 20 MG tablet  Commonly known as:   PRINIVIL,ZESTRIL  Take 20 mg by mouth daily.     loratadine 10 MG tablet  Commonly known as:  CLARITIN  Take 10 mg by mouth daily.     metFORMIN 500 MG tablet  Commonly known as:  GLUCOPHAGE  Take 500 mg by mouth daily.     nicotine 21 mg/24hr patch  Commonly known as:  NICODERM CQ - dosed in mg/24 hours  Place 1 patch (21 mg total) onto the skin daily.     nitroGLYCERIN 0.4 MG SL tablet  Commonly known as:  NITROSTAT  Place 1 tablet (0.4 mg total) under the tongue every 5 (five) minutes x 3 doses as needed for chest pain.     omeprazole 20 MG capsule  Commonly known as:  PRILOSEC  Take 20 mg by mouth daily.     pregabalin 300 MG capsule  Commonly known as:  LYRICA  Take 300 mg by mouth every 4 (four) hours as needed (For pain).     promethazine 25 MG tablet  Commonly known as:  PHENERGAN  Take 25 mg by mouth every 4 (four) hours.     rOPINIRole 0.25 MG tablet  Commonly known as:  REQUIP  Take 0.25 mg by mouth at bedtime.     senna-docusate 8.6-50 MG per tablet  Commonly known as:  Senokot-S  Take 1 tablet by mouth at bedtime as needed for mild constipation.     trazodone 300 MG tablet  Commonly known as:  DESYREL  Take 1 tablet (300 mg total) by mouth at bedtime.        Disposition   The patient will be discharged in stable condition to [facility].  Follow-up Information    Follow up with Antoine Poche, MD On 07/26/2014.   Specialty:  Cardiology   Why:  @ 2:20pm   Contact information:   [address] [place] [address] [phone]         Duration of Discharge Encounter: Greater than 30 minutes including physician and PA time.  Byrd Hesselbach R PA-C 07/12/2014, 5:15 PM

## 2014-07-12 NOTE — Progress Notes (Signed)
Patient states [facility] to [facility] without being given IV ativan; called Dr. Beverly MilchGlenn Jennings who gave verbal order for IV ativan right before transport.  Hermina BartersBOWMAN, Annaleise Burger M, RN

## 2014-07-12 NOTE — Consult Note (Signed)
Psychiatry Consult Follow up  Reason for Consult:  Cocaine abuse and intoxication, depression Referring Physician:  Dr. Aundra Dubin Patient Identification: Shirley Short MRN:  [id number] Principal Diagnosis: Psychoactive substance-induced mood disorder Diagnosis:   Patient Active Problem List   Diagnosis Date Noted  . Psychoactive substance-induced mood disorder [F19.94, F06.30] 07/12/2014  . Chest pain [R07.9] 07/07/2014  . Chronic pain disorder [G89.4] 05/31/2014  . MDD (major depressive disorder) [F32.2] 05/31/2014  . Abscess of right hand [L02.511]   . CAD (coronary artery disease) [I25.10]   . MDD (major depressive disorder), recurrent, severe, with psychosis [F33.3] 05/29/2014  . Cocaine abuse [F14.10] 05/23/2014  . Obesity (BMI 30-39.9) [E66.9] 05/23/2014  . COPD (chronic obstructive pulmonary disease) [J44.9] 05/23/2014  . Hyperlipidemia [E78.5] 05/23/2014  . DM2 (diabetes mellitus, type 2) [E11.9]   . Noncompliance [Z91.19]   . Tobacco abuse [Z72.0]   . Hypertension [I10]     Total Time spent with patient: 30 minutes  Subjective:   Shirley Short is a 54 y.o. female patient admitted with depression, suicidal ideation and substance abuse recent relapse.  HPI:  Shirley Short is a 54 years old female admitted to [facility] from [facility] for increased acute chest pain probably secondary to cocaine intoxication. Patient has similar clinical presentation about a month ago. Patient reported she has been staying with her roommate in [place]. Patient has been relapsed on drug of abuse which caused acute chest pain. Patient has been partially compliant with her medication management. Patient reported she has been depressed and endorses suicidal ideation. Patient has history of suicidal attempt by overdosing on Seroquel in the past. Patient has been seeing a psychiatric provider at [facility]. Patient does not contract for  safety at this time and willing to be placed in psychiatric inpatient facility. Patient also wanted to go to substance abuse inpatient rehabilitation when medically and psychiatrically stable. Patient has no evidence of psychosis. Patient reported she was stressed about her family not being supportive to her. Patient was recently admitted to behavioral health Hospital about a month ago but could not stay more than 2 days because of swollen hand.  Interval History: Patient seen for the psych consultation follow up. Patient has been compliant with psych medication and reportedly no complaints and states that she is not getting ativan as much as she wants. she was educated about the PRN status and staff has to check her vitals prior to giving medication. She understood and agreed. Patient has been admitted with substance induced depression, suicide ideation and history of suicide attempt. She is admitted to [facility] medical floor for chest pain, probable related to cocaine abuse. Patient is willing to be admitted to [facility] but has questions about what kind of services she will be receiving and she has only two day visit during the month of March 2016. She has been contracting for safety while in hospital. She is trying to minimize her symptoms of depression today and asking to find a substance abuse rehab treatment. She reports smoking blunts and cocaine and also taking xanax from street and also from [facility]. She is poor historian and questionable compliant with [facility]. Spoke with social service who has been in contact with [facility] administrator, who may find a bed later today. She will be transferred to [facility] when medically stable. She may need IVC papers if she refuses to sign in voluntary admission.    Medical history: 54 yo with history of CAD  and cocaine abuse, chronic pain, DMII, chronic LBBB, COPD, and HTN was transferred from [facility] to [facility] for evaluation of chest pain. Patient is an active abuser  of cocaine and smokes cigarettes. She was admitted to [facility] in 2010 with cocaine-induced CP and had cath showing distal LAD disease. She was admitted in 2/16 to [facility] with chest pain in the setting of cocaine use. Troponin was 0.1, cardiac cath showed moderate disease (see PMH section below). She was managed medically. Rise in troponin thought to be vasospasm in an already diseased artery. During this hospitalization, she reported suicidal ideation and was sent to [facility]. She was discharged from [facility] in 3/16. She started using cocaine again. She went to [facility] ER on 4/13 with chest pain radiating to her neck while watching television. ECG showed chronic LBBB. TnI 0.02. She was admitted to [facility]. She was then transferred down here tonight.   Unfortunately, [facility] did not send a discharge summary. I only have the troponin drawn initially when she arrived to the [facility] ER. This was negative. Cocaine was positive in urine. Prior to sending her down from [facility] to [facility], she was given 3 mg IV Dilaudid. She is, therefore, lethargic and only wakes up to say a few words and falls back asleep. From what I can gather, she has rather diffuse pain throughout her chest and abdomen. This has been present apparently since she arrived to [place].   Review of systems complete and found to be negative unless listed above    Past Medical History:  Past Medical History  Diagnosis Date  . CAD (coronary artery disease)     a. s/p LHC on 05/2014 admission with non obst dz. Rx medically.   . Insulin dependent diabetes mellitus   . Noncompliance   . Obesity (BMI 30-39.9)   . Hyperlipidemia   . Hypertension   . Cocaine abuse   . Major depressive disorder   . Suicidal ideation   . Abscess of right hand     a. s/p ID on 05/2014 admission     Past Surgical History  Procedure Laterality Date  . Appendectomy    . Abdominal hysterectomy    . Cholecystectomy    .  Left heart catheterization with coronary angiogram N/A 05/23/2014    Procedure: LEFT HEART CATHETERIZATION WITH CORONARY ANGIOGRAM;  Surgeon: Lorretta Harp, MD;  Location: [facility] CATH LAB;  Service: Cardiovascular;  Laterality: N/A;  . I&d extremity Right 05/25/2014    Procedure: IRRIGATION AND DEBRIDEMENT EXTREMITY;  Surgeon: Charlotte Crumb, MD;  Location: [place];  Service: Orthopedics;  Laterality: Right;   Family History: History reviewed. No pertinent family history. Social History:  History  Alcohol Use No     History  Drug Use  . Yes  . Special: Cocaine    Comment: Cocaine    History   Social History  . Marital Status: Divorced    Spouse Name: N/A  . Number of Children: N/A  . Years of Education: N/A   Social History Main Topics  . Smoking status: Current Every Day Smoker -- 1.50 packs/day for 30 years    Types: Cigarettes  . Smokeless tobacco: Never Used  . Alcohol Use: No  . Drug Use: Yes    Special: Cocaine     Comment: Cocaine  . Sexual Activity: Not on file   Other Topics Concern  . None   Social History Narrative   Additional Social History: Patient has a daughter with  her grandson lives in [place]:   Allergies  Allergen Reactions  . Compazine [Prochlorperazine Edisylate] Other (See Comments)  . Quinolones Swelling and Hives  . Levofloxacin Hives and Swelling  . Prochlorperazine Other (See Comments)    "draws face to one side"  . Avelox [Moxifloxacin Hcl In Nacl]   . Levaquin [Levofloxacin In D5w]   . Prochlorperazine Maleate Other (See Comments)    Draws up her face per the patient like a stroke    Labs:  Results for orders placed or performed during the hospital encounter of 07/07/14 (from the past 48 hour(s))  Glucose, capillary     Status: Abnormal   Collection Time: 07/10/14  4:25 PM  Result Value Ref Range   Glucose-Capillary 183 (H) 70 - 99 mg/dL  Glucose, capillary     Status:  Abnormal   Collection Time: 07/10/14  8:54 PM  Result Value Ref Range   Glucose-Capillary 211 (H) 70 - 99 mg/dL  Glucose, capillary     Status: Abnormal   Collection Time: 07/11/14  5:54 AM  Result Value Ref Range   Glucose-Capillary 289 (H) 70 - 99 mg/dL  Basic metabolic panel     Status: Abnormal   Collection Time: 07/11/14  7:16 AM  Result Value Ref Range   Sodium 137 135 - 145 mmol/L   Potassium 4.8 3.5 - 5.1 mmol/L   Chloride 103 96 - 112 mmol/L   CO2 27 19 - 32 mmol/L   Glucose, Bld 262 (H) 70 - 99 mg/dL   BUN 12 6 - 23 mg/dL   Creatinine, Ser 0.71 0.50 - 1.10 mg/dL   Calcium 9.5 8.4 - 10.5 mg/dL   GFR calc non Af Amer >90 >90 mL/min   GFR calc Af Amer >90 >90 mL/min    Comment: (NOTE) The eGFR has been calculated using the CKD EPI equation. This calculation has not been validated in all clinical situations. eGFR's persistently <90 mL/min signify possible Chronic Kidney Disease.    Anion gap 7 5 - 15  CBC     Status: Abnormal   Collection Time: 07/11/14  7:16 AM  Result Value Ref Range   WBC 6.9 4.0 - 10.5 K/uL   RBC 4.27 3.87 - 5.11 MIL/uL   Hemoglobin 11.9 (L) 12.0 - 15.0 g/dL   HCT 36.6 36.0 - 46.0 %   MCV 85.7 78.0 - 100.0 fL   MCH 27.9 26.0 - 34.0 pg   MCHC 32.5 30.0 - 36.0 g/dL   RDW 14.0 11.5 - 15.5 %   Platelets 128 (L) 150 - 400 K/uL  Glucose, capillary     Status: Abnormal   Collection Time: 07/11/14 11:46 AM  Result Value Ref Range   Glucose-Capillary 238 (H) 70 - 99 mg/dL  Glucose, capillary     Status: Abnormal   Collection Time: 07/11/14  4:18 PM  Result Value Ref Range   Glucose-Capillary 213 (H) 70 - 99 mg/dL  Glucose, capillary     Status: Abnormal   Collection Time: 07/11/14  8:35 PM  Result Value Ref Range   Glucose-Capillary 186 (H) 70 - 99 mg/dL  Glucose, capillary     Status: Abnormal   Collection Time: 07/12/14  6:16 AM  Result Value Ref Range   Glucose-Capillary 181 (H) 70 - 99 mg/dL  Glucose, capillary  Status: Abnormal    Collection Time: 07/12/14 11:14 AM  Result Value Ref Range   Glucose-Capillary 288 (H) 70 - 99 mg/dL   Comment 1 Notify RN     Vitals: Blood pressure 125/46, pulse 70, temperature 98.7 F (37.1 C), temperature source Oral, resp. rate 18, height 5' 4"  (1.626 m), weight 85.3 kg (188 lb 0.8 oz), SpO2 94 %.  Risk to Self: Is patient at risk for suicide?: No Risk to Others:   Prior Inpatient Therapy:   Prior Outpatient Therapy:    Current Facility-Administered Medications  Medication Dose Route Frequency Provider Last Rate Last Dose  . 0.9 %  sodium chloride infusion  250 mL Intravenous PRN Larey Dresser, MD 10 mL/hr at 07/07/14 2323 250 mL at 07/07/14 2323  . acetaminophen (TYLENOL) tablet 650 mg  650 mg Oral Q4H PRN Larey Dresser, MD      . amLODipine (NORVASC) tablet 5 mg  5 mg Oral Daily Mihai Croitoru, MD   5 mg at 07/12/14 1021  . antiseptic oral rinse (CPC / CETYLPYRIDINIUM CHLORIDE 0.05%) solution 7 mL  7 mL Mouth Rinse BID Larey Dresser, MD   7 mL at 07/12/14 1023  . aspirin EC tablet 81 mg  81 mg Oral Daily Larey Dresser, MD   81 mg at 07/12/14 1020  . atorvastatin (LIPITOR) tablet 80 mg  80 mg Oral q1800 Larey Dresser, MD   80 mg at 07/11/14 1644  . docusate sodium (COLACE) capsule 100 mg  100 mg Oral BID PRN Consuelo Pandy, PA-C   100 mg at 07/11/14 1725  . DULoxetine (CYMBALTA) DR capsule 40 mg  40 mg Oral Daily Ambrose Finland, MD   40 mg at 07/12/14 1021  . gabapentin (NEURONTIN) capsule 800 mg  800 mg Oral TID Ambrose Finland, MD   800 mg at 07/12/14 1021  . heparin injection 5,000 Units  5,000 Units Subcutaneous 3 times per day Larey Dresser, MD   5,000 Units at 07/12/14 0617  . HYDROcodone-acetaminophen (NORCO/VICODIN) 5-325 MG per tablet 1-2 tablet  1-2 tablet Oral Q4H PRN Larey Dresser, MD   2 tablet at 07/12/14 1253  . ibuprofen (ADVIL,MOTRIN) tablet 400 mg  400 mg Oral Q6H PRN Mihai Croitoru, MD      . insulin aspart (novoLOG) injection  0-15 Units  0-15 Units Subcutaneous TID WC Larey Dresser, MD   8 Units at 07/12/14 1142  . insulin detemir (LEVEMIR) injection 20 Units  20 Units Subcutaneous QHS Larey Dresser, MD   20 Units at 07/11/14 2140  . isosorbide mononitrate (IMDUR) 24 hr tablet 30 mg  30 mg Oral Daily Mihai Croitoru, MD   30 mg at 07/12/14 1021  . lisinopril (PRINIVIL,ZESTRIL) tablet 20 mg  20 mg Oral Daily Mihai Croitoru, MD   20 mg at 07/12/14 1021  . LORazepam (ATIVAN) injection 1 mg  1 mg Intramuscular Q6H PRN Ambrose Finland, MD   1 mg at 07/12/14 0748  . metFORMIN (GLUCOPHAGE) tablet 500 mg  500 mg Oral Q breakfast Mihai Croitoru, MD   500 mg at 07/12/14 1021  . nicotine (NICODERM CQ - dosed in mg/24 hours) patch 21 mg  21 mg Transdermal Daily Larey Dresser, MD   21 mg at 07/12/14 1022  . nitroGLYCERIN (NITROSTAT) SL tablet 0.4 mg  0.4 mg Sublingual Q5 Min x 3 PRN Larey Dresser, MD      . ondansetron [facility]) injection 4 mg  4  mg Intravenous Q6H PRN Larey Dresser, MD      . pantoprazole (PROTONIX) EC tablet 40 mg  40 mg Oral Daily Larey Dresser, MD   40 mg at 07/12/14 1022  . rOPINIRole (REQUIP) tablet 0.25 mg  0.25 mg Oral QHS Mihai Croitoru, MD   0.25 mg at 07/11/14 2140  . sodium chloride 0.9 % injection 3 mL  3 mL Intravenous Q12H Larey Dresser, MD   3 mL at 07/11/14 2210  . sodium chloride 0.9 % injection 3 mL  3 mL Intravenous PRN Larey Dresser, MD      . traZODone (DESYREL) tablet 300 mg  300 mg Oral QHS Ambrose Finland, MD        Musculoskeletal: Strength & Muscle Tone: decreased Gait & Station: unable to stand Patient leans: N/A  Psychiatric Specialty Exam: Physical Exam as per history and physical   ROS depression, anxiety and suicidal ideation and drug-seeking behavior   Blood pressure 125/46, pulse 70, temperature 98.7 F (37.1 C), temperature source Oral, resp. rate 18, height 5' 4"  (1.626 m), weight 85.3 kg (188 lb 0.8 oz), SpO2 94 %.Body mass index is 32.26  kg/(m^2).  General Appearance: Casual  Eye Contact::  Good  Speech:  Clear and Coherent  Volume:  Decreased  Mood:  Anxious and Depressed  Affect:  Appropriate and Congruent  Thought Process:  Coherent and Goal Directed  Orientation:  Full (Time, Place, and Person)  Thought Content:  WDL  Suicidal Thoughts:  Yes.  without intent/plan  Homicidal Thoughts:  No  Memory:  Immediate;   Fair Recent;   Fair  Judgement:  Impaired  Insight:  Lacking  Psychomotor Activity:  Decreased  Concentration:  Good  Recall:  Good  Fund of Knowledge:Good  Language: Good  Akathisia:  Negative  Handed:  Right  AIMS (if indicated):     Assets:  Communication Skills Desire for Improvement Financial Resources/Insurance Housing Leisure Time Resilience  ADL's:  Impaired  Cognition: WNL  Sleep:      Medical Decision Making: New problem, with additional work up planned, Review of Psycho-Social Stressors (1), Review or order clinical lab tests (1), Established Problem, Worsening (2), Review or order medicine tests (1), Review of Medication Regimen & Side Effects (2) and Review of New Medication or Change in Dosage (2)  Treatment Plan Summary: Daily contact with patient to assess and evaluate symptoms and progress in treatment and Medication management  Plan: Continue Air cabin crew.   Discontinue Xanax due to substance abuse and drug-seeking behavior Continue lorazepam intramuscular every 6 hours when necessary for anxiety and seizures Continue Neurontin 800 mg 3 times daily Continue Cymbalta 40 mg daily for depression Continue Trazodone 300 mg at bedtime Recommend psychiatric Inpatient admission when medically cleared. Supportive therapy provided about ongoing stressors. Appreciate psychiatric consultation and follow up as clinically required Please contact [phone] or [phone] if needs further assistance  Disposition: Refer to the psychiatric social service for appropriate disposition plans  including placement.  Abhijay Morriss,JANARDHAHA R. 07/12/2014 1:42 PM

## 2014-07-12 NOTE — Discharge Planning (Signed)
[facility]. RN and patient updated at bedside.  Patient signed Voluntary Admission form. Covering Psych CSW placed form on patient's chart.  Patient to be transported via [facility] [phone] after 7pm, [facility]. Patient to be admitted to room 501-1. RN to please call report to [phone] closer to 7pm.   PA updated regarding discharge.  Marcelline DeistEmily Elesia Pemberton, [place]CS[place] Cell: [phone]       Fax: [phone] Clinical Social Work: Orthopedics [phone]([id number]) and Surgical [phone]([id number])

## 2014-07-12 NOTE — Clinical Documentation Improvement (Signed)
Supporting Information: Diabetes mellitus, type II noted per 4/14 progress notes.   Labs: 4/15: hgb A1C: 11.4.   Possible Clinical Condition: --Due to underlying condition --Other specified type . Control: --Hypoglycemia --Hyperglycemia . Insulin use . Document any associated diagnoses/conditions . Manifestation/Complication (document link to diabetes) --Circulatory complications --Hyperosmolarity --With or without coma --Hypoglycemia --Ketoacidosis --With or without coma --Kidney complications --Neurological complications --Ophthalmic complications --Oral complications --Skin complications --Arthropathy --Other (specify)    Thank You,  Debria GarretWanda Mathews-Bethea,RN,BSN,Clinical Documentation Specialist [phone] [email or web address]

## 2014-07-12 NOTE — Progress Notes (Signed)
[facility]; will call [phone] for transportation at 1900.  Hermina BartersBOWMAN, Chyenne Sobczak M, RN

## 2014-07-12 NOTE — Progress Notes (Signed)
[facility] has the patients following belongings jeans, shirt, and flip flops. [facility] [phone] to inquire about where to send patients belongings.  Hermina BartersBOWMAN, Inaya Gillham M, RN

## 2014-07-12 NOTE — Tx Team (Signed)
Initial Interdisciplinary Treatment Plan   PATIENT STRESSORS: Financial difficulties Health problems Marital or family conflict Substance abuse   PATIENT STRENGTHS: Ability for insight General fund of knowledge Motivation for treatment/growth Supportive family/friends   PROBLEM LIST: Problem List/Patient Goals Date to be addressed Date deferred Reason deferred Estimated date of resolution  "I am depressed" 4/19/016     "I am anxious" 07/12/2014     Suicide Risk "I was having suicidal thought but I'm not having them now" 07/12/2014     "I want to come here so the doctor can write me a note so I can get a home aide worker to help me. 07/12/2014     "I hurt all the time"                               DISCHARGE CRITERIA:  Ability to meet basic life and health needs Adequate post-discharge living arrangements Medical problems require only outpatient monitoring Motivation to continue treatment in a less acute level of care Reduction of life-threatening or endangering symptoms to within safe limits  PRELIMINARY DISCHARGE PLAN: Attend aftercare/continuing care group Attend PHP/IOP Return to previous living arrangement  PATIENT/FAMIILY INVOLVEMENT: This treatment plan has been presented to and reviewed with the patient, Conley SimmondsDonna L Hodzic.  The patient and family have been given the opportunity to ask questions and make suggestions.  Angeline SlimHill, Yentl Verge M 07/12/2014, 8:24 PM

## 2014-07-12 NOTE — Progress Notes (Signed)
Patient Profile: 54 y/o with history of CAD and cocaine abuse, chronic pain, DMII, chronic LBBB, COPD, and HTN was [facility] to [facility] for evaluation of chest pain. UDS + for cocaine.   Subjective: Very drowsy from AM ativan. Will hardly wake up to answer questions. No further CP. Says she is okay to leave today for inpatient psych placement.  Objective: Vital signs in last 24 hours: Temp:  [98 F (36.7 C)-98.7 F (37.1 C)] 98.7 F (37.1 C) (04/19 0534) Pulse Rate:  [66-70] 70 (04/19 0534) Resp:  [18] 18 (04/19 0534) BP: (117-154)/(46-61) 125/46 mmHg (04/19 0534) SpO2:  [94 %-96 %] 94 % (04/19 0534) Last BM Date:  (patient in unsure of last BM)  Intake/Output from previous day: 04/18 0701 - 04/19 0700 In: 1796 [P.O.:1796] Out: -  Intake/Output this shift:    Medications Current Facility-Administered Medications  Medication Dose Route Frequency Provider Last Rate Last Dose  . 0.9 %  sodium chloride infusion  250 mL Intravenous PRN Laurey Moralealton S McLean, MD 10 mL/hr at 07/07/14 2323 250 mL at 07/07/14 2323  . acetaminophen (TYLENOL) tablet 650 mg  650 mg Oral Q4H PRN Laurey Moralealton S McLean, MD      . amLODipine (NORVASC) tablet 5 mg  5 mg Oral Daily Mihai Croitoru, MD   5 mg at 07/11/14 1018  . antiseptic oral rinse (CPC / CETYLPYRIDINIUM CHLORIDE 0.05%) solution 7 mL  7 mL Mouth Rinse BID Laurey Moralealton S McLean, MD   7 mL at 07/11/14 2141  . aspirin EC tablet 81 mg  81 mg Oral Daily Laurey Moralealton S McLean, MD   81 mg at 07/11/14 1019  . atorvastatin (LIPITOR) tablet 80 mg  80 mg Oral q1800 Laurey Moralealton S McLean, MD   80 mg at 07/11/14 1644  . docusate sodium (COLACE) capsule 100 mg  100 mg Oral BID PRN Allayne ButcherBrittainy M Simmons, PA-C   100 mg at 07/11/14 1725  . DULoxetine (CYMBALTA) DR capsule 40 mg  40 mg Oral Daily Leata MouseJanardhana Jonnalagadda, MD      . gabapentin (NEURONTIN) capsule 800 mg  800 mg Oral TID Leata MouseJanardhana Jonnalagadda, MD   800 mg at 07/11/14 2140  . heparin injection 5,000  Units  5,000 Units Subcutaneous 3 times per day Laurey Moralealton S McLean, MD   5,000 Units at 07/12/14 0617  . HYDROcodone-acetaminophen (NORCO/VICODIN) 5-325 MG per tablet 1-2 tablet  1-2 tablet Oral Q4H PRN Laurey Moralealton S McLean, MD   2 tablet at 07/12/14 [phone]  . ibuprofen (ADVIL,MOTRIN) tablet 400 mg  400 mg Oral Q6H PRN Mihai Croitoru, MD      . insulin aspart (novoLOG) injection 0-15 Units  0-15 Units Subcutaneous TID WC Laurey Moralealton S McLean, MD   3 Units at 07/12/14 [phone]  . insulin detemir (LEVEMIR) injection 20 Units  20 Units Subcutaneous QHS Laurey Moralealton S McLean, MD   20 Units at 07/11/14 2140  . isosorbide mononitrate (IMDUR) 24 hr tablet 30 mg  30 mg Oral Daily Mihai Croitoru, MD   30 mg at 07/11/14 1019  . lisinopril (PRINIVIL,ZESTRIL) tablet 20 mg  20 mg Oral Daily Mihai Croitoru, MD   20 mg at 07/11/14 1018  . LORazepam (ATIVAN) injection 1 mg  1 mg Intramuscular Q6H PRN Leata MouseJanardhana Jonnalagadda, MD   1 mg at 07/12/14 0748  . metFORMIN (GLUCOPHAGE) tablet 500 mg  500 mg Oral Q breakfast Mihai Croitoru, MD   500 mg at 07/11/14 [id number]  . nicotine (NICODERM CQ - dosed in mg/24 hours)  patch 21 mg  21 mg Transdermal Daily Laurey Morale, MD   21 mg at 07/11/14 1019  . nitroGLYCERIN (NITROSTAT) SL tablet 0.4 mg  0.4 mg Sublingual Q5 Min x 3 PRN Laurey Morale, MD      . ondansetron [facility]) injection 4 mg  4 mg Intravenous Q6H PRN Laurey Morale, MD      . pantoprazole (PROTONIX) EC tablet 40 mg  40 mg Oral Daily Laurey Morale, MD   40 mg at 07/11/14 1019  . rOPINIRole (REQUIP) tablet 0.25 mg  0.25 mg Oral QHS Mihai Croitoru, MD   0.25 mg at 07/11/14 2140  . sodium chloride 0.9 % injection 3 mL  3 mL Intravenous Q12H Laurey Morale, MD   3 mL at 07/11/14 2210  . sodium chloride 0.9 % injection 3 mL  3 mL Intravenous PRN Laurey Morale, MD      . traZODone (DESYREL) tablet 300 mg  300 mg Oral QHS Leata Mouse, MD        PE: General appearance: alert, cooperative and no distress Neck: no carotid  bruit and no JVD Lungs: clear to auscultation bilaterally Heart: regular rate and rhythm, S1, S2 normal, no murmur, click, rub or gallop Extremities: no LEE Pulses: 2+ and symmetric Skin: warm and dry Neurologic: Grossly normal   Tele :  SR 60s   Lab Results:   Recent Labs  07/11/14 0716  WBC 6.9  HGB 11.9*  HCT 36.6  PLT 128*   BMET  Recent Labs  07/11/14 0716  NA 137  K 4.8  CL 103  CO2 27  GLUCOSE 262*  BUN 12  CREATININE 0.71  CALCIUM 9.5   PT/INR No results for input(s): LABPROT, INR in the last 72 hours. Cholesterol No results for input(s): CHOL in the last 72 hours. Cardiac Panel (last 3 results) No results for input(s): CKTOTAL, CKMB, TROPONINI, RELINDX in the last 72 hours.  Assessment/Plan  Active Problems:   Chest pain   1. Chest Pain Patient has known, moderate CAD by recent cardiac catheterization, and also recent cocaine abuse. CP episode likely secondary to coronary spasm. Chest pain, presently resolved without clear evidence of ACS by cardiac enzymes. Continue medical therapy for underlying moderate CAD with avoidance of BB given recurrent cocaine abuse.  Chest pain today is increased with breath and with direct pressure to chest   I do not think cardiac    2. Chronic LBBB: stable  3. Chronic Pain: Chronic pain with history of opiate abuse. Patient is on Neurontin, Lyrica, trazodone, possibly other pain medications as an outpatient. She states that she does not have a primary care provider - previous one was at a facility in [place]. Recent admission to behavioral health noted. Will ask Case Management to assist with disposition/ PCP/ behavioral health follow-up.   4. Cardiomyopathy: with chronic systolic heart failure, medically stable. LVEF 25-30% by recent echocardiogram. On ACE inhibitor but no beta blocker with cocaine abuse.  5. Tobacco and Cocaine Use: recurring. cessation strongly advised.   6. Suicidal Ideation: Patient reported  thoughts of hurting herself. She now has a Comptroller.  She had suicidal ideation during last hospitalization and required psych consult. Will ask them to reassess and help with her meds.   7. Psych- seen by Dr. Sharyne Peach yesterday who made many medication adjustments and discontinued xanax due to substance abuse and drug-seeking behavior. Recommended inpatient psych when medically cleared.   Dispo- will send to inpatient psych.  SW working on placement.     LOS: 5 days  07/12/2014 9:45 AM  THOMPSON, KATHRYN R    Patinet seen and examined  I agree with Philomena Course  I have amended note to reflect my findings. Plan for psych evaluation when bed available.  Dietrich Pates

## 2014-07-12 NOTE — Clinical Social Work Psych Note (Signed)
Covering Psych CSW continuing to assist with discharge disposition to Inpatient Psychiatric Treatment, as recommended by Psych MD. Currently no beds available at this time.  Patient is followed outside of hospital by Psychiatrist, Dr. Starling MannsJoe Hughes in [place], [place].  Per patient, patient lives with patient's "good friend"/roommate, Shirley Short. Patient states patient and patient's roommate have been living together for the past five years. Patient informed Covering Psych CSW that patient nor patient's roommate are able to drive. Per patient, patient's Mental Health Clinician, Shepard GeneralJosh Short [phone]), assists patient with transportation to appointments.  Covering Psych CSW has updated patient and patient RN regarding discharge disposition.  [name], [place] Cell: [phone]       Fax: [phone] Clinical Social Work: Orthopedics [phone]([id number]) and Surgical [phone]([id number])

## 2014-07-12 NOTE — Progress Notes (Signed)
Utilization review completed.  

## 2014-07-13 ENCOUNTER — Other Ambulatory Visit: Payer: Self-pay

## 2014-07-13 ENCOUNTER — Encounter (HOSPITAL_COMMUNITY): Payer: Self-pay | Admitting: Psychiatry

## 2014-07-13 ENCOUNTER — Ambulatory Visit (HOSPITAL_COMMUNITY): Admit: 2014-07-13 | Payer: Medicaid - Out of State

## 2014-07-13 ENCOUNTER — Ambulatory Visit (HOSPITAL_COMMUNITY): Payer: Medicaid - Out of State

## 2014-07-13 ENCOUNTER — Ambulatory Visit (HOSPITAL_COMMUNITY)
Admission: RE | Admit: 2014-07-13 | Discharge: 2014-07-13 | Disposition: A | Discharge status: Home / Self Care | Payer: Medicaid - Out of State | Source: Ambulatory Visit | Attending: Internal Medicine | Admitting: Internal Medicine

## 2014-07-13 DIAGNOSIS — E1165 Type 2 diabetes mellitus with hyperglycemia: Secondary | ICD-10-CM

## 2014-07-13 DIAGNOSIS — I25119 Atherosclerotic heart disease of native coronary artery with unspecified angina pectoris: Secondary | ICD-10-CM

## 2014-07-13 DIAGNOSIS — M79641 Pain in right hand: Secondary | ICD-10-CM | POA: Insufficient documentation

## 2014-07-13 DIAGNOSIS — F1414 Cocaine abuse with cocaine-induced mood disorder: Secondary | ICD-10-CM | POA: Diagnosis present

## 2014-07-13 DIAGNOSIS — L02519 Cutaneous abscess of unspecified hand: Secondary | ICD-10-CM

## 2014-07-13 DIAGNOSIS — M869 Osteomyelitis, unspecified: Secondary | ICD-10-CM

## 2014-07-13 DIAGNOSIS — R079 Chest pain, unspecified: Secondary | ICD-10-CM

## 2014-07-13 DIAGNOSIS — R45851 Suicidal ideations: Secondary | ICD-10-CM

## 2014-07-13 DIAGNOSIS — M779 Enthesopathy, unspecified: Secondary | ICD-10-CM

## 2014-07-13 DIAGNOSIS — L03119 Cellulitis of unspecified part of limb: Secondary | ICD-10-CM

## 2014-07-13 DIAGNOSIS — F332 Major depressive disorder, recurrent severe without psychotic features: Secondary | ICD-10-CM | POA: Diagnosis present

## 2014-07-13 LAB — BASIC METABOLIC PANEL
Anion gap: 10 (ref 5–15)
BUN: 17 mg/dL (ref 6–23)
CO2: 27 mmol/L (ref 19–32)
Calcium: 10.1 mg/dL (ref 8.4–10.5)
Chloride: 98 mmol/L (ref 96–112)
Creatinine, Ser: 0.9 mg/dL (ref 0.50–1.10)
GFR calc Af Amer: 83 mL/min — ABNORMAL LOW (ref >90)
GFR calc non Af Amer: 71 mL/min — ABNORMAL LOW (ref >90)
GLUCOSE: 167 mg/dL — AB (ref 70–99)
POTASSIUM: 4.4 mmol/L (ref 3.5–5.1)
SODIUM: 135 mmol/L (ref 135–145)

## 2014-07-13 LAB — I-STAT TROPONIN, ED: TROPONIN I, POC: 0 ng/mL (ref 0.00–0.08)

## 2014-07-13 LAB — CBC WITH DIFFERENTIAL/PLATELET
BASOS ABS: 0 10*3/uL (ref 0.0–0.1)
Basophils Relative: 0 % (ref 0–1)
Eosinophils Absolute: 0.1 10*3/uL (ref 0.0–0.7)
Eosinophils Relative: 1 % (ref 0–5)
HCT: 38.5 % (ref 36.0–46.0)
HEMOGLOBIN: 12.6 g/dL (ref 12.0–15.0)
LYMPHS ABS: 2 10*3/uL (ref 0.7–4.0)
Lymphocytes Relative: 26 % (ref 12–46)
MCH: 27.8 pg (ref 26.0–34.0)
MCHC: 32.7 g/dL (ref 30.0–36.0)
MCV: 84.8 fL (ref 78.0–100.0)
Monocytes Absolute: 0.5 10*3/uL (ref 0.1–1.0)
Monocytes Relative: 6 % (ref 3–12)
NEUTROS ABS: 5.2 10*3/uL (ref 1.7–7.7)
NEUTROS PCT: 67 % (ref 43–77)
PLATELETS: 158 10*3/uL (ref 150–400)
RBC: 4.54 MIL/uL (ref 3.87–5.11)
RDW: 13.6 % (ref 11.5–15.5)
WBC: 7.8 10*3/uL (ref 4.0–10.5)

## 2014-07-13 LAB — GLUCOSE, CAPILLARY
GLUCOSE-CAPILLARY: 196 mg/dL — AB (ref 70–99)
Glucose-Capillary: 169 mg/dL — ABNORMAL HIGH (ref 70–99)
Glucose-Capillary: 311 mg/dL — ABNORMAL HIGH (ref 70–99)

## 2014-07-13 LAB — CBC
HEMATOCRIT: 40.9 % (ref 36.0–46.0)
HEMOGLOBIN: 13.8 g/dL (ref 12.0–15.0)
MCH: 28.1 pg (ref 26.0–34.0)
MCHC: 33.7 g/dL (ref 30.0–36.0)
MCV: 83.3 fL (ref 78.0–100.0)
Platelets: 146 10*3/uL — ABNORMAL LOW (ref 150–400)
RBC: 4.91 MIL/uL (ref 3.87–5.11)
RDW: 13.9 % (ref 11.5–15.5)
WBC: 7.4 10*3/uL (ref 4.0–10.5)

## 2014-07-13 LAB — TROPONIN I: Troponin I: <0.03 ng/mL (ref <0.031)

## 2014-07-13 MED ORDER — NITROGLYCERIN 0.4 MG SL SUBL: 0.4000 mg | SUBLINGUAL_TABLET | SUBLINGUAL | Status: DC | PRN

## 2014-07-13 MED ORDER — TRAZODONE HCL 100 MG PO TABS: 200.0000 mg | ORAL_TABLET | Freq: Every day | ORAL | Status: DC

## 2014-07-13 MED ORDER — DOXYCYCLINE HYCLATE 100 MG PO TABS
100.0000 mg | ORAL_TABLET | Freq: Two times a day (BID) | ORAL | Status: DC
  Administered 2014-07-13 – 2014-07-15 (×5): 100 mg via ORAL
  Filled 2014-07-13 (×13): qty 1

## 2014-07-13 MED ORDER — ONDANSETRON HCL 4 MG/2ML IJ SOLN
4.0000 mg | Freq: Once | INTRAMUSCULAR | Status: AC
  Administered 2014-07-14: 4 mg via INTRAVENOUS
  Filled 2014-07-13: qty 2

## 2014-07-13 MED ORDER — HYDROXYZINE HCL 10 MG PO TABS
10.0000 mg | ORAL_TABLET | Freq: Four times a day (QID) | ORAL | Status: DC | PRN
  Administered 2014-07-13: 10 mg via ORAL
  Filled 2014-07-13 (×2): qty 1
  Filled 2014-07-13: qty 20
  Filled 2014-07-13: qty 1

## 2014-07-13 MED ORDER — LORAZEPAM 0.5 MG PO TABS
0.5000 mg | ORAL_TABLET | Freq: Four times a day (QID) | ORAL | Status: DC | PRN
  Administered 2014-07-13 – 2014-07-15 (×5): 0.5 mg via ORAL
  Filled 2014-07-13 (×5): qty 1

## 2014-07-13 MED ORDER — TRAZODONE HCL 150 MG PO TABS
150.0000 mg | ORAL_TABLET | Freq: Every day | ORAL | Status: DC
  Administered 2014-07-14: 150 mg via ORAL
  Filled 2014-07-13 (×4): qty 1

## 2014-07-13 MED ORDER — GI COCKTAIL ~~LOC~~
30.0000 mL | Freq: Once | ORAL | Status: AC
  Administered 2014-07-14: 30 mL via ORAL
  Filled 2014-07-13: qty 30

## 2014-07-13 MED ORDER — INSULIN GLARGINE 100 UNIT/ML ~~LOC~~ SOLN
50.0000 [IU] | Freq: Every day | SUBCUTANEOUS | Status: DC
  Administered 2014-07-14 – 2014-07-15 (×2): 50 [IU] via SUBCUTANEOUS
  Filled 2014-07-13: qty 0.5

## 2014-07-13 MED ORDER — LORAZEPAM 2 MG/ML IJ SOLN
1.0000 mg | Freq: Once | INTRAMUSCULAR | Status: AC
  Administered 2014-07-14: 1 mg via INTRAVENOUS
  Filled 2014-07-13: qty 1

## 2014-07-13 MED ORDER — LORAZEPAM 0.5 MG PO TABS
ORAL_TABLET | ORAL | Status: AC
  Filled 2014-07-13: qty 1

## 2014-07-13 NOTE — H&P (Signed)
Psychiatric Admission Assessment Adult  Patient Identification: Shirley Short MRN:  [id number] Date of Evaluation:  07/13/2014 Chief Complaint:  Substance Induced Mood Disorder Principal Diagnosis: Major depressive disorder, recurrent, severe without psychotic features Diagnosis:   Patient Active Problem List   Diagnosis Date Noted  . Major depressive disorder, recurrent, severe without psychotic features [F33.2]   . Cocaine abuse with cocaine-induced mood disorder [F14.14]   . Psychoactive substance-induced mood disorder [F19.94, F06.30] 07/12/2014  . Substance induced mood disorder [F19.94] 07/12/2014  . Chest pain [R07.9] 07/07/2014  . Chronic pain disorder [G89.4] 05/31/2014  . MDD (major depressive disorder) [F32.2] 05/31/2014  . Abscess of right hand [L02.511]   . CAD (coronary artery disease) [I25.10]   . MDD (major depressive disorder), recurrent, severe, with psychosis [F33.3] 05/29/2014  . Cocaine abuse [F14.10] 05/23/2014  . Obesity (BMI 30-39.9) [E66.9] 05/23/2014  . COPD (chronic obstructive pulmonary disease) [J44.9] 05/23/2014  . Hyperlipidemia [E78.5] 05/23/2014  . DM2 (diabetes mellitus, type 2) [E11.9]   . Noncompliance [Z91.19]   . Tobacco abuse [Z72.0]   . Hypertension [I10]    History of Present Illness:: Patient states that she went to the hospital with complaints of chest pain.  States that she informed the nurse that she was depressed with suicidal thoughts.  States that she had a aunt to die 1 week ago, sister to pass 1 month ago, and a friend in jail now for murder.  Another stressor is that patient was receiving home health services and was recently told that she would receiving anymore and she feels that she stills needs the services.  Patient states that she has a his tory of PTSD related to seeing someone killed right beside her and still has nightmares about it.  Patient has a history of suicide attempt via overdose.  Patient also has a history of illicit  drug abuse (Polysubstance and IV use).  Patient continues to endorse passive suicidal thoughts without a plan; but is able to contract for safety at this time.  Patient denies homicidal ideation, psychosis, and paranoia.  Patient has a history of using Cymbalta that she states worked well for her.   At this time patient continues to complain of chest pain and an 12 lead EKG was obtained with abnormal result.  Hospitalist consulted who will place orders for Troponin level.  Hospitalist also informed about the patient complaints of pain to right hand that is swollen, red, and warm to touch (During last hospital visit after IV infiltration patient needed surgery to hand.  Patient states that hand was doing well up to 3-4 days ago when it started swelling.  States that she is have pain in had now along with the redness and swelling).    Elements:  Location:  Worsening depression. Quality:  Anxiety. Severity:  Suicidal thoughts. Duration:  several days. Associated Signs/Symptoms: Depression Symptoms:  depressed mood, insomnia, hopelessness, recurrent thoughts of death, suicidal thoughts without plan, anxiety, (Hypo) Manic Symptoms:  Irritable Mood, Anxiety Symptoms:  Excessive Worry, Psychotic Symptoms:  Denies PTSD Symptoms: Had a traumatic exposure:  Someone murder right next to her Total Time spent with patient: 1 hour  Past Medical History:  Past Medical History  Diagnosis Date  . CAD (coronary artery disease)     a. s/p LHC on 05/2014 admission with non obst dz. Rx medically.   . Insulin dependent diabetes mellitus   . Noncompliance   . Obesity (BMI 30-39.9)   . Hyperlipidemia   . Hypertension   .  Cocaine abuse   . Major depressive disorder   . Suicidal ideation   . Abscess of right hand     a. s/p ID on 05/2014 admission     Past Surgical History  Procedure Laterality Date  . Appendectomy    . Abdominal hysterectomy    . Cholecystectomy    . Left heart catheterization with  coronary angiogram N/A 05/23/2014    Procedure: LEFT HEART CATHETERIZATION WITH CORONARY ANGIOGRAM;  Surgeon: Runell Gess, MD;  Location: [facility] CATH LAB;  Service: Cardiovascular;  Laterality: N/A;  . I&d extremity Right 05/25/2014    Procedure: IRRIGATION AND DEBRIDEMENT EXTREMITY;  Surgeon: Dairl Ponder, MD;  Location: [facility] OR;  Service: Orthopedics;  Laterality: Right;   Family History: History reviewed. No pertinent family history. Social History:  History  Alcohol Use No     History  Drug Use  . Yes  . Special: Cocaine    Comment: Cocaine    History   Social History  . Marital Status: Divorced    Spouse Name: N/A  . Number of Children: N/A  . Years of Education: N/A   Social History Main Topics  . Smoking status: Current Every Day Smoker -- 2.00 packs/day for 30 years    Types: Cigarettes  . Smokeless tobacco: Never Used  . Alcohol Use: No  . Drug Use: Yes    Special: Cocaine     Comment: Cocaine  . Sexual Activity: Not on file   Other Topics Concern  . None   Social History Narrative   Additional Social History:    Musculoskeletal: Strength & Muscle Tone: within normal limits Gait & Station: normal Patient leans: N/A  Psychiatric Specialty Exam: Physical Exam  Constitutional: She is oriented to person, place, and time.  Neck: Normal range of motion.  Respiratory: Effort normal.  Musculoskeletal: Normal range of motion.  Neurological: She is alert and oriented to person, place, and time.  Skin:  Right hand swollen, red, warm to touch, and a scab where patient had surgery.  Patient states that swelling and redness started 3-4 days ago.      Review of Systems  Respiratory:       History of COPD   Cardiovascular: Positive for chest pain.       History of CAD, MI, and HTN  Hospitalist contacted related to abnormal EKG and complaints of chest pain     Skin:       Right hand red, swollen, and warm to the touch.  Complaints of pain    Endo/Heme/Allergies:       History of Type 2 diabetes   Psychiatric/Behavioral: Positive for depression and substance abuse. Negative for hallucinations. Suicidal ideas: passive. The patient is nervous/anxious and has insomnia.     Blood pressure 174/69, pulse 75, temperature 97.7 F (36.5 C), temperature source Oral, resp. rate 20, height  (1.626 m), weight 88.451 kg (195 lb), SpO2 98 %.Body mass index is 33.46 kg/(m^2).  General Appearance: Disheveled  Eye Contact::  Good  Speech:  Clear and Coherent and Normal Rate  Volume:  Normal  Mood:  Depressed  Affect:  Congruent  Thought Process:  Circumstantial and Linear  Orientation:  Full (Time, Place, and Person)  Thought Content:  Denies hallucinations, delusions, and paranoia  Suicidal Thoughts:  Yes.  without intent/plan  Homicidal Thoughts:  No  Memory:  Immediate;   Good Recent;   Good Remote;   Good  Judgement:  Fair  Insight:  Fair  Psychomotor Activity:  Normal  Concentration:  Fair  Recall:  Good  Fund of Knowledge:Good  Language: Good  Akathisia:  No  Handed:  Right  AIMS (if indicated):     Assets:  Communication Skills Desire for Improvement  ADL's:  Intact  Cognition: WNL  Sleep:      Risk to Self: Is patient at risk for suicide?: Yes Risk to Others:   Prior Inpatient Therapy:   Prior Outpatient Therapy:    Alcohol Screening: 1. How often do you have a drink containing alcohol?: Never 9. Have you or someone else been injured as a result of your drinking?: No 10. Has a relative or friend or a doctor or another health worker been concerned about your drinking or suggested you cut down?: No Alcohol Use Disorder Identification Test Final Score (AUDIT): 0 Brief Intervention: AUDIT score less than 7 or less-screening does not suggest unhealthy drinking-brief intervention not indicated  Allergies:   Allergies  Allergen Reactions  . Compazine [Prochlorperazine Edisylate] Other (See Comments)  . Quinolones  Swelling and Hives  . Levofloxacin Hives and Swelling  . Prochlorperazine Other (See Comments)    "draws face to one side"  . Avelox [Moxifloxacin Hcl In Nacl]   . Levaquin [Levofloxacin In D5w]   . Prochlorperazine Maleate Other (See Comments)    Draws up her face per the patient like a stroke   Lab Results:  Results for orders placed or performed during the hospital encounter of 07/12/14 (from the past 48 hour(s))  Glucose, capillary     Status: Abnormal   Collection Time: 07/12/14 10:44 PM  Result Value Ref Range   Glucose-Capillary 295 (H) 70 - 99 mg/dL  Glucose, capillary     Status: Abnormal   Collection Time: 07/13/14  6:12 AM  Result Value Ref Range   Glucose-Capillary 196 (H) 70 - 99 mg/dL  Glucose, capillary     Status: Abnormal   Collection Time: 07/13/14 11:15 AM  Result Value Ref Range   Glucose-Capillary 169 (H) 70 - 99 mg/dL   Comment 1 Notify RN    Comment 2 Document in Chart   Troponin I     Status: None   Collection Time: 07/13/14 11:45 AM  Result Value Ref Range   Troponin I <0.03 <0.031 ng/mL    Comment:        NO INDICATION OF MYOCARDIAL INJURY. Performed at [facility]   CBC with Differential/Platelet     Status: None   Collection Time: 07/13/14 11:45 AM  Result Value Ref Range   WBC 7.8 4.0 - 10.5 K/uL   RBC 4.54 3.87 - 5.11 MIL/uL   Hemoglobin 12.6 12.0 - 15.0 g/dL   HCT 16.1 09.6 - 04.5 %   MCV 84.8 78.0 - 100.0 fL   MCH 27.8 26.0 - 34.0 pg   MCHC 32.7 30.0 - 36.0 g/dL   RDW 40.9 81.1 - 91.4 %   Platelets 158 150 - 400 K/uL   Neutrophils Relative % 67 43 - 77 %   Neutro Abs 5.2 1.7 - 7.7 K/uL   Lymphocytes Relative 26 12 - 46 %   Lymphs Abs 2.0 0.7 - 4.0 K/uL   Monocytes Relative 6 3 - 12 %   Monocytes Absolute 0.5 0.1 - 1.0 K/uL   Eosinophils Relative 1 0 - 5 %   Eosinophils Absolute 0.1 0.0 - 0.7 K/uL   Basophils Relative 0 0 - 1 %   Basophils Absolute 0.0 0.0 -  0.1 K/uL    Comment: Performed at [facility]   Current Medications: Current Facility-Administered Medications  Medication Dose Route Frequency Provider Last Rate Last Dose  . acetaminophen (TYLENOL) tablet 650 mg  650 mg Oral Q6H PRN Kerry Hough, PA-C      . alum & mag hydroxide-simeth (MAALOX/MYLANTA) 200-200-20 MG/5ML suspension 30 mL  30 mL Oral Q4H PRN Kerry Hough, PA-C      . amLODipine (NORVASC) tablet 5 mg  5 mg Oral Daily Kerry Hough, PA-C   5 mg at 07/13/14 1610  . aspirin EC tablet 81 mg  81 mg Oral Daily Kerry Hough, PA-C   81 mg at 07/13/14 9604  . atorvastatin (LIPITOR) tablet 80 mg  80 mg Oral q1800 Kerry Hough, PA-C      . doxycycline (VIBRA-TABS) tablet 100 mg  100 mg Oral Q12H Maryruth Bun Rama, MD   100 mg at 07/13/14 1207  . DULoxetine (CYMBALTA) DR capsule 40 mg  40 mg Oral Daily Kerry Hough, PA-C   40 mg at 07/13/14 0818  . gabapentin (NEURONTIN) capsule 800 mg  800 mg Oral TID Kerry Hough, PA-C   800 mg at 07/13/14 1207  . hydrochlorothiazide (HYDRODIURIL) tablet 25 mg  25 mg Oral BH-q7a Spencer E Simon, PA-C   25 mg at 07/13/14 0820  . hydrOXYzine (ATARAX/VISTARIL) tablet 10 mg  10 mg Oral Q6H PRN Craige Cotta, MD      . ibuprofen (ADVIL,MOTRIN) tablet 800 mg  800 mg Oral TID PRN Kerry Hough, PA-C   800 mg at 07/12/14 2258  . insulin aspart (novoLOG) injection 0-20 Units  0-20 Units Subcutaneous TID WC Kerry Hough, PA-C   4 Units at 07/13/14 1206  . insulin aspart (novoLOG) injection 0-5 Units  0-5 Units Subcutaneous QHS Kerry Hough, PA-C   3 Units at 07/12/14 2256  . insulin aspart (novoLOG) injection 4 Units  4 Units Subcutaneous TID WC Kerry Hough, PA-C   4 Units at 07/13/14 [phone]  . insulin glargine (LANTUS) injection 40 Units  40 Units Subcutaneous Daily Kerry Hough, PA-C   40 Units at 07/13/14 8119  . isosorbide mononitrate (IMDUR) 24 hr tablet 30 mg  30 mg Oral Daily Kerry Hough, PA-C   30 mg at 07/13/14 1478  . lisinopril  (PRINIVIL,ZESTRIL) tablet 20 mg  20 mg Oral Daily Kerry Hough, PA-C   20 mg at 07/13/14 2956  . loratadine (CLARITIN) tablet 10 mg  10 mg Oral Daily Kerry Hough, PA-C   10 mg at 07/13/14 2130  . magnesium hydroxide (MILK OF MAGNESIA) suspension 30 mL  30 mL Oral Daily PRN Kerry Hough, PA-C      . metFORMIN (GLUCOPHAGE) tablet 500 mg  500 mg Oral Q breakfast Kerry Hough, PA-C   500 mg at 07/13/14 8657  . mometasone-formoterol (DULERA) 100-5 MCG/ACT inhaler 2 puff  2 puff Inhalation BID Kerry Hough, PA-C   2 puff at 07/13/14 8469  . nicotine (NICODERM CQ - dosed in mg/24 hours) patch 21 mg  21 mg Transdermal Daily Kerry Hough, PA-C   21 mg at 07/13/14 6295  . nitroGLYCERIN (NITROSTAT) SL tablet 0.4 mg  0.4 mg Sublingual Q5 Min x 3 PRN Kerry Hough, PA-C      . pantoprazole (PROTONIX) EC tablet 40 mg  40 mg Oral Daily Kerry Hough, PA-C   40 mg  at 07/13/14 0819  . pregabalin (LYRICA) capsule 100 mg  100 mg Oral Q4H PRN Kerry Hough, PA-C   100 mg at 07/13/14 1210  . rOPINIRole (REQUIP) tablet 0.25 mg  0.25 mg Oral QHS Kerry Hough, PA-C   0.25 mg at 07/12/14 2258  . senna-docusate (Senokot-S) tablet 1 tablet  1 tablet Oral QHS PRN Kerry Hough, PA-C      . traZODone (DESYREL) tablet 300 mg  300 mg Oral QHS Kerry Hough, PA-C   300 mg at 07/12/14 2258   PTA Medications: Prescriptions prior to admission  Medication Sig Dispense Refill Last Dose  . amLODipine (NORVASC) 5 MG tablet Take 1 tablet (5 mg total) by mouth daily. 30 tablet 11 07/12/2014 at Unknown time  . aspirin EC 81 MG EC tablet Take 1 tablet (81 mg total) by mouth daily.   07/12/2014 at Unknown time  . atorvastatin (LIPITOR) 80 MG tablet Take 1 tablet (80 mg total) by mouth daily at 6 PM. 30 tablet 11 07/12/2014 at Unknown time  . DULoxetine (CYMBALTA) 20 MG capsule Take 40 mg by mouth daily.    07/12/2014 at Unknown time  . isosorbide mononitrate (IMDUR) 30 MG 24 hr tablet Take 1 tablet (30 mg total)  by mouth daily. 3 tablet 11 07/12/2014 at Unknown time  . lisinopril (PRINIVIL,ZESTRIL) 20 MG tablet Take 20 mg by mouth daily.  0 07/12/2014 at Unknown time  . metFORMIN (GLUCOPHAGE) 500 MG tablet Take 500 mg by mouth daily.   07/12/2014 at Unknown time  . amitriptyline (ELAVIL) 100 MG tablet Take 1 tablet by mouth at bedtime.  0 07/05/2014  . doxycycline (VIBRA-TABS) 100 MG tablet Take 1 tablet (100 mg total) by mouth every 12 (twelve) hours. 12 tablet 0 07/05/2014  . Fluticasone-Salmeterol (ADVAIR) 250-50 MCG/DOSE AEPB Inhale 1 puff into the lungs every 12 (twelve) hours.   07/05/2014  . gabapentin (NEURONTIN) 400 MG capsule Take 2 capsules (800 mg total) by mouth 3 (three) times daily. 180 capsule 3   . glucose blood test strip 1 Strip by Other route three times daily     . hydrochlorothiazide (HYDRODIURIL) 25 MG tablet Take 25 mg by mouth every morning.   06/03/2014  . HYDROcodone-acetaminophen (NORCO/VICODIN) 5-325 MG per tablet Take 1-2 tablets by mouth every 4 (four) hours as needed for moderate pain. (Patient not taking: Reported on 07/08/2014) 30 tablet 0 Not Taking at Unknown time  . ibuprofen (ADVIL,MOTRIN) 800 MG tablet Take 800 mg by mouth 3 (three) times daily as needed for mild pain.    07/05/2014  . insulin detemir (LEVEMIR) 100 UNIT/ML injection Inject 0.2 mLs (20 Units total) into the skin at bedtime. 10 mL 11 07/05/2014  . insulin glargine (LANTUS) 100 UNIT/ML injection Inject 40 Units into the skin daily.   07/05/2014  . insulin regular (NOVOLIN R,HUMULIN R) 100 units/mL injection Inject into the skin 3 (three) times daily before meals. Sliding scale   unknown  . loratadine (CLARITIN) 10 MG tablet Take 10 mg by mouth daily.  0 07/05/2014  . nicotine (NICODERM CQ - DOSED IN MG/24 HOURS) 21 mg/24hr patch Place 1 patch (21 mg total) onto the skin daily. 28 patch 11 07/08/2014 at Unknown time  . nitroGLYCERIN (NITROSTAT) 0.4 MG SL tablet Place 1 tablet (0.4 mg total) under the tongue every 5  (five) minutes x 3 doses as needed for chest pain. 25 tablet 12   . omeprazole (PRILOSEC) 20 MG capsule Take 20 mg  by mouth daily.   07/05/2014  . pregabalin (LYRICA) 300 MG capsule Take 300 mg by mouth every 4 (four) hours as needed (For pain).    07/05/2014  . promethazine (PHENERGAN) 25 MG tablet Take 25 mg by mouth every 4 (four) hours.   07/05/2014  . rOPINIRole (REQUIP) 0.25 MG tablet Take 0.25 mg by mouth at bedtime.   07/05/2014  . senna-docusate (SENOKOT-S) 8.6-50 MG per tablet Take 1 tablet by mouth at bedtime as needed for mild constipation. 20 tablet 0 07/05/2014  . trazodone (DESYREL) 300 MG tablet Take 1 tablet (300 mg total) by mouth at bedtime. 30 tablet 11     Previous Psychotropic Medications: Yes   Substance Abuse History in the last 12 months:  Yes.      Consequences of Substance Abuse: Family Consequences:  Family discord  Results for orders placed or performed during the hospital encounter of 07/12/14 (from the past 72 hour(s))  Glucose, capillary     Status: Abnormal   Collection Time: 07/12/14 10:44 PM  Result Value Ref Range   Glucose-Capillary 295 (H) 70 - 99 mg/dL  Glucose, capillary     Status: Abnormal   Collection Time: 07/13/14  6:12 AM  Result Value Ref Range   Glucose-Capillary 196 (H) 70 - 99 mg/dL  Glucose, capillary     Status: Abnormal   Collection Time: 07/13/14 11:15 AM  Result Value Ref Range   Glucose-Capillary 169 (H) 70 - 99 mg/dL   Comment 1 Notify RN    Comment 2 Document in Chart   Troponin I     Status: None   Collection Time: 07/13/14 11:45 AM  Result Value Ref Range   Troponin I <0.03 <0.031 ng/mL    Comment:        NO INDICATION OF MYOCARDIAL INJURY. Performed at [facility]   CBC with Differential/Platelet     Status: None   Collection Time: 07/13/14 11:45 AM  Result Value Ref Range   WBC 7.8 4.0 - 10.5 K/uL   RBC 4.54 3.87 - 5.11 MIL/uL   Hemoglobin 12.6 12.0 - 15.0 g/dL   HCT 16.1 09.6 - 04.5 %   MCV  84.8 78.0 - 100.0 fL   MCH 27.8 26.0 - 34.0 pg   MCHC 32.7 30.0 - 36.0 g/dL   RDW 40.9 81.1 - 91.4 %   Platelets 158 150 - 400 K/uL   Neutrophils Relative % 67 43 - 77 %   Neutro Abs 5.2 1.7 - 7.7 K/uL   Lymphocytes Relative 26 12 - 46 %   Lymphs Abs 2.0 0.7 - 4.0 K/uL   Monocytes Relative 6 3 - 12 %   Monocytes Absolute 0.5 0.1 - 1.0 K/uL   Eosinophils Relative 1 0 - 5 %   Eosinophils Absolute 0.1 0.0 - 0.7 K/uL   Basophils Relative 0 0 - 1 %   Basophils Absolute 0.0 0.0 - 0.1 K/uL    Comment: Performed at [facility]    Observation Level/Precautions:  15 minute checks  Laboratory:  CBC Chemistry Profile UA Vitamin B-12  Psychotherapy:  Individual and group sessions  Medications:  Will start medications add/adjust as appropriate for patient stabilization  Consultations:  Psychiatry  Discharge Concerns:  Safety, stabilization, and risk of access to medication and medication stabilization   Estimated LOS:  5-7 days  Other:     Psychological Evaluations: Yes   Treatment Plan Summary: Daily contact with patient to assess and evaluate  symptoms and progress in treatment and Medication management  1. Admit for crisis management and stabilization 2. Medication management to reduce current symptoms to bale line and improve the patient's overall level of functioning:  Continue Cymbalta increase 40 mg daily.  Consult with Hospitalist related abnormal EKG, complaints chest pain, and the complaints of pain in patients right hand (red, swollen, and warm to touch) rule out infection.  Troponin 1 and CBC/Diff ordered.   3. Treat health problems as indicated 4. Develop treatment plan to decrease risk of relapse upon discharge and the need for readmission. 5. Psycho-social education regarding relapse prevention and self care. 6. Health care follow up as needed for medical problems 7. Restart home medications where appropriate.    Medical Decision Making:  Established  Problem, Stable/Improving (1), New problem, with additional work up planned, Review of Psycho-Social Stressors (1), Review or order clinical lab tests (1), Review of Last Therapy Session (1), Review or order medicine tests (1), Independent Review of image, tracing or specimen (2) and Review of Medication Regimen & Side Effects (2)  I certify that inpatient services furnished can reasonably be expected to improve the patient's condition.   Assunta FoundRankin, Shuvon, FNP-BC 4/20/20161:22 PM
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
## 2014-07-13 NOTE — BHH Suicide Risk Assessment (Signed)
[facility] Admission Suicide Risk Assessment   Nursing information obtained from:  Patient Demographic factors:  Caucasian Current Mental Status:  Suicidal ideation indicated by patient, Self-harm thoughts Loss Factors:  Decline in physical health Historical Factors:  Prior suicide attempts Risk Reduction Factors:  Living with another person, especially a relative Total Time spent with patient: 45 minutes Principal Problem: Depression, Cocaine Abuse  Diagnosis:   Patient Active Problem List   Diagnosis Date Noted  . Psychoactive substance-induced mood disorder [F19.94, F06.30] 07/12/2014  . Substance induced mood disorder [F19.94] 07/12/2014  . Chest pain [R07.9] 07/07/2014  . Chronic pain disorder [G89.4] 05/31/2014  . MDD (major depressive disorder) [F32.2] 05/31/2014  . Abscess of right hand [L02.511]   . CAD (coronary artery disease) [I25.10]   . MDD (major depressive disorder), recurrent, severe, with psychosis [F33.3] 05/29/2014  . Cocaine abuse [F14.10] 05/23/2014  . Obesity (BMI 30-39.9) [E66.9] 05/23/2014  . COPD (chronic obstructive pulmonary disease) [J44.9] 05/23/2014  . Hyperlipidemia [E78.5] 05/23/2014  . DM2 (diabetes mellitus, type 2) [E11.9]   . Noncompliance [Z91.19]   . Tobacco abuse [Z72.0]   . Hypertension [I10]      Continued Clinical Symptoms:  Alcohol Use Disorder Identification Test Final Score (AUDIT): 0 The "Alcohol Use Disorders Identification Test", Guidelines for Use in Primary Care, Second Edition.  World Science writer [facility]). Score between 0-7:  no or low risk or alcohol related problems. Score between 8-15:  moderate risk of alcohol related problems. Score between 16-19:  high risk of alcohol related problems. Score 20 or above:  warrants further diagnostic evaluation for alcohol dependence and treatment.   CLINICAL FACTORS:  54 year old female , who was initially admitted to inpatient medical unit due to reports of chest pain. Does have a  history of CAD. Work up was negative for MI. While on medical unit reported depression and SI. States she is facing significant losses, to include death of sister death of aunt , and one of her friends being accused of murder. States she has depression, which is chronic. She acknolwedges a history of abusing cocaine in binges , but states she was not aware she had used recently. ( most recent tox screens have been positive for BZDs, Opiates, and Cocaine). Dx- MDD, Cocaine Abuse  Plan- Admit to inpatient psychiatric unit.  Of note, have requested hospitalist consult due to persistence of chest pain ( her  EKG is  unchanged compared to prior and  She is no acute distress or discomfort , and because her R hand appears infected ). At this time patient is on Cymbalta 40 mgrs QDA, on Neurontin, on Trazodone for insomnia.    Musculoskeletal: Strength & Muscle Tone: within normal limits Gait & Station: normal Patient leans: N/A  Psychiatric Specialty Exam: Physical Exam  ROS  Blood pressure 174/69, pulse 75, temperature 97.7 F (36.5 C), temperature source Oral, resp. rate 20, height  (1.626 m), weight 195 lb (88.451 kg), SpO2 98 %.Body mass index is 33.46 kg/(m^2).  General Appearance: Fairly Groomed  Patent attorney::  Fair  Speech:  Normal Rate  Volume:  Normal  Mood:  Depressed  Affect:  constricted   Thought Process:  Goal Directed and Linear  Orientation:  Full (Time, Place, and Person)  Thought Content:  denies hallucinations, no delusions expressed, focused on medications , requesiting BZDS and Opiates   Suicidal Thoughts:  Yes.  without intent/plan- at this time patient denies plan or intention of hurting self and contracts for safety  on the unit   Homicidal Thoughts:  No  Memory:  recent and remote fair   Judgement:  Impaired  Insight:  Shallow  Psychomotor Activity:  Decreased  Concentration:  Good  Recall:  Good  Fund of Knowledge:Good  Language: Good  Akathisia:  Negative   Handed:  Right  AIMS (if indicated):     Assets:  Desire for Improvement Resilience  Sleep:     Cognition: WNL  ADL's:  Impaired     COGNITIVE FEATURES THAT CONTRIBUTE TO RISK:  Closed-mindedness and Loss of executive function    SUICIDE RISK:   Moderate:  Frequent suicidal ideation with limited intensity, and duration, some specificity in terms of plans, no associated intent, good self-control, limited dysphoria/symptomatology, some risk factors present, and identifiable protective factors, including available and accessible social support.  PLAN OF CARE: Patient will be admitted to inpatient psychiatric unit for stabilization and safety. Will provide and encourage milieu participation. Provide medication management and maked adjustments as needed.  Will follow daily.    Medical Decision Making:  New problem, with additional work up planned, Review of Psycho-Social Stressors (1), Established Problem, Worsening (2) and Review of Medication Regimen & Side Effects (2)  I certify that inpatient services furnished can reasonably be expected to improve the patient's condition.   Amirra Herling 07/13/2014, 11:54 AM

## 2014-07-13 NOTE — [facility] Group Notes (Signed)
[facility] L[facility] Dis[facility] Planning [facility] Note   07/13/2014 10:02 AM  Participation Quality:  Invited.  Did not attend    Kiribatiorth, Baldo DaubRodney B

## 2014-07-13 NOTE — Consult Note (Signed)
Consult Note:   Shirley Short:[id number] DOB: 1961/01/16 DOA: 07/12/2014 PCP: No PCP Per Patient  Requesting physician: Dr. Jama Flavors  Reason for consultation: Right hand infection, chest pain    History of Present Illness:   Shirley Short is a 54 year old woman admitted to [facility] 07/13/14, for treatment of polysubstance abuse including cocaine, and psychoactive substance abuse mood disorder, after being hospitalized 07/07/14-07/12/14 for evaluation of chest pain.  The patient has a known history of CAD (moderate by cardiac cath done 2010) and cardiomyopathy (EF 25-30% by Echo done 05/23/14), and was managed medically for cocaine induced coronary vasospasm.  Troponins were negative.  We were initially asked to evaluate the patient who began to complain of chest pain earlier today.  An EKG done at the time of the patient's complaint showed no ischemic changes and was similar to her baseline EKG.  She refused nitroglycerin, when offered.  Pain was substernal, atypical (non-exertional), dull, without radiation or associated nausea, vomiting or diaphoresis.  When she was not given additional pain medication, she began to report pain in her right hand, at the site of a prior IV site, which she described as "excrutiating", and was unrelieved by Ibuprofen, throbbing in quality, 10/10.  She insisted I prescribe her something stronger for her pain, and threatened to leave AMA if she wasn't given something stronger.  She endorses subjective fever, although no fever has been documented.  She has a history of a + staph aureus wound culture 05/25/14 and underwent right hand I&D by Dr. Mina Marble and was supposed to F/U with him post operatively and take doxycycline x 3 weeks (fluoroquinolone allergic).  She was discharged to [facility] after this orthopedic intervention, then re-admitted to [facility] 06/01/14 due to worsening cellulitis, and was put on Cefepime and Vancomycin.  She was subsequently discharged on doxycycline 06/04/14.       Allergies:     Allergies  Allergen Reactions  . Compazine [Prochlorperazine Edisylate] Other (See Comments)  . Quinolones Swelling and Hives  . Levofloxacin Hives and Swelling  . Prochlorperazine Other (See Comments)    "draws face to one side"  . Avelox [Moxifloxacin Hcl In Nacl]   . Levaquin [Levofloxacin In D5w]   . Prochlorperazine Maleate Other (See Comments)    Draws up her face per the patient like a stroke    Past Medical History:     Past Medical History  Diagnosis Date  . CAD (coronary artery disease)     a. s/p LHC on 05/2014 admission with non obst dz. Rx medically.   . Insulin dependent diabetes mellitus   . Noncompliance   . Obesity (BMI 30-39.9)   . Hyperlipidemia   . Hypertension   . Cocaine abuse   . Major depressive disorder   . Suicidal ideation   . Abscess of right hand     a. s/p ID on 05/2014 admission     Past Surgical History:   Past Surgical History  Procedure Laterality Date  . Appendectomy    . Abdominal hysterectomy    . Cholecystectomy    . Left heart catheterization with coronary angiogram N/A 05/23/2014    Procedure: LEFT HEART CATHETERIZATION WITH CORONARY ANGIOGRAM;  Surgeon: Runell Gess, MD;  Location: [facility] CATH LAB;  Service: Cardiovascular;  Laterality: N/A;  . I&d extremity Right 05/25/2014    Procedure: IRRIGATION AND DEBRIDEMENT EXTREMITY;  Surgeon: Dairl Ponder, MD;  Location: [facility] OR;  Service: Orthopedics;  Laterality: Right;  Current Medications:   Scheduled Meds: . amLODipine  5 mg Oral Daily  . aspirin EC  81 mg Oral Daily  . atorvastatin  80 mg Oral q1800  . doxycycline  100 mg Oral Q12H  . DULoxetine  40 mg Oral Daily  . gabapentin  800 mg Oral TID  . hydrochlorothiazide  25 mg Oral BH-q7a  . insulin aspart  0-20 Units Subcutaneous TID WC  . insulin aspart  0-5 Units Subcutaneous QHS  . insulin aspart  4 Units Subcutaneous TID WC  . insulin glargine  40 Units Subcutaneous Daily  . isosorbide mononitrate   30 mg Oral Daily  . lisinopril  20 mg Oral Daily  . loratadine  10 mg Oral Daily  . LORazepam      . metFORMIN  500 mg Oral Q breakfast  . mometasone-formoterol  2 puff Inhalation BID  . nicotine  21 mg Transdermal Daily  . pantoprazole  40 mg Oral Daily  . rOPINIRole  0.25 mg Oral QHS  . trazodone  150 mg Oral QHS   Continuous Infusions:  PRN Meds:.acetaminophen, alum & mag hydroxide-simeth, hydrOXYzine, ibuprofen, LORazepam, magnesium hydroxide, nitroGLYCERIN, pregabalin, senna-docusate  Social History:   The patient reports that she has been smoking Cigarettes.  She has a 60 pack-year smoking history. She has never used smokeless tobacco. She reports that she uses illicit drugs (Cocaine). She reports that she does not drink alcohol.  She lives with a roommate who is a "good friend".  Ambulates independently.  Family History:   Mother had CAD/MI at age 83.  Review of Systems:   Some subjective fevers, all other systems reviewed and are as noted in the HPI above or are otherwise negative.  Physical Exam:    Filed Vitals:   07/12/14 2030 07/12/14 2031 07/13/14 0741 07/13/14 0742  BP: 146/51 120/55 174/67 174/69  Pulse: 75 80 72 75  Temp: 97.8 F (36.6 C)  97.7 F (36.5 C)   TempSrc: Oral  Oral   Resp: 18  20   Height:  (1.626 m)     Weight: 88.451 kg (195 lb)     SpO2: 98%       No intake or output data in the 24 hours ending 07/13/14 2130  General: Alert, awake, oriented x3, in no acute distress. HEENT: No bruits, no goiter. Heart: Regular rate and rhythm, without murmurs, rubs, gallops. Lungs: Clear to auscultation bilaterally. Abdomen: Soft, nontender, nondistended, positive bowel sounds. Extremities: No clubbing cyanosis or edema with positive pedal pulses. Right hand with erythema, swelling, scab, tender to touch. Neuro: Grossly intact, nonfocal. Psych: Mood and affect irritable.  Data Review:   Labs:  CBC:    Component Value Date/Time   WBC 7.8  07/13/2014 1145   HGB 12.6 07/13/2014 1145   HCT 38.5 07/13/2014 1145   PLT 158 07/13/2014 1145   MCV 84.8 07/13/2014 1145   NEUTROABS 5.2 07/13/2014 1145   LYMPHSABS 2.0 07/13/2014 1145   MONOABS 0.5 07/13/2014 1145   EOSABS 0.1 07/13/2014 1145   BASOSABS 0.0 07/13/2014 1145    Basic Metabolic Panel:    Component Value Date/Time   NA 137 07/11/2014 0716   K 4.8 07/11/2014 0716   CL 103 07/11/2014 0716   CO2 27 07/11/2014 0716   BUN 12 07/11/2014 0716   CREATININE 0.71 07/11/2014 0716   GLUCOSE 262* 07/11/2014 0716   CALCIUM 9.5 07/11/2014 0716   CBG (last 3)   Recent Labs  07/13/14 0612 07/13/14  1115 07/13/14 1711  GLUCAP 196* 169* 311*     Radiological Exams: No results found.  Assessment and Plan:   Principal Problem:   Major depressive disorder, recurrent, severe without psychotic features/substance induced mood disorder/cocaine abuse with cocaine induced mood disorder - Management per primary team. - On Cymbalta.  Active Problems:   Chest pain in a patient with moderate CAD - Avoid narcotics given history of polysubstance abuse. - EKG/troponin negative for ischemia. - Can treat with PRN nitroglycerin or Ativan for possible cocaine induced coronary vasospasm. - Continue ASA, statin, Imdur.    Cellulitis rule out abscess of hand - Start doxycycline, has history of MSSA wound infection, doxycycline senstive. - Check MRI to rule out underlying abscess/tendinitis/osteomyelitis. - May need to reconsult orthopedics/hand surgery for further debridement if MRI+ abscess or tendinitis/osteomyelitis.    Hypertension - Continue Norvasc, lisinopril and HCTZ.    Diabetes with neurological complications - Continue resistant scale SSI, 4 units of NovoLOG Q AC.  CBGs 169-311.  - Increase Lantus to 50 units. - Continue metformin.  Thank you for this consultation.  We will follow the patient with you.  Time Spent on Consult: 1 hour.  Shirley Short 07/13/2014,  9:30 PM

## 2014-07-13 NOTE — Tx Team (Signed)
Interdisciplinary Treatment Plan Update (Adult)  Date: 07/13/2014 Time Reviewed: 12:57 PM  Progress in Treatment:  Attending groups: Yes  Participating in groups:  Yes  Taking medication as prescribed: Yes  Tolerating medication: Yes  Family/Significant othe contact made:No, pt refused.  Patient understands diagnosis: No, limited insight  Discussing patient identified problems/goals with staff: Yes  Medical problems stabilized or resolved: Yes  Denies suicidal/homicidal ideation: Yes  Patient has not harmed self or Others: Yes  New problem(s) identified: NA Discharge Plan or Barriers: Pt plans to return home. Currently, she is refusing to sign releases to set up follow up appointments.  Additional comments:54 y/o female who present voluntarily for depression, anxiety and suicidal ideations. Patient states she also hears auditory hallucinations at times. Patient states she was in the hospital for chest [facility]. Patient states she does not know what caused it but states they found cocaine in her system. Patient states, "I don't use cocaine I only smoked blunt. Patient states she was having suicidal thoughts with not currently. Patient denies HI but states she is having auditory hallucinations that tell her negative things. Patient verbally contracts for safety. [facility] but states she was sent out to the hospital because she had and infection to her infection to right that was ongoing prior to that admission.Cymbalta, Neurontin, Trazodone trial.  Multiple medical medications  Pt and CSW Intern reviewed pt's identified goals and treatment plan. Pt verbalized understanding and agreed to treatment plan  Reason for Continuation of Hospitalization:  Medication stabilization  Suicidal Ideation  Depression   Estimated length of stay: 4-5 days   Attendees:  Patient:  07/13/2014 12:57 PM   Family:  4/20/201612:57 PM   Physician: Dr. Elna BreslowEappen  MD  4/20/201612:57 PM  Nursing: Leda QuailStefanie Smith, RN 07/13/2014 12:57 PM  Clinical Social Worker: Daryel Geraldodney Allexa Acoff, [place]  4/20/201612:57 PM  Clinical Social Worker: Charleston Ropesandace Hyatt, CSW Intern 4/20/201612:57 PM  Other:  4/20/201612:57 PM  Other:  4/20/201612:57 PM  Other:  4/20/201612:57 PM  Scribe for Treatment Team:  Charleston Ropesandace Hyatt, CSW Intern 07/13/2014 12:57 PM

## 2014-07-13 NOTE — Progress Notes (Signed)
54 y/o female who present voluntarily for depression, anxiety and suicidal ideations.  Patient states she also hears auditory hallucinations at times.  Patient states she was in the hospital for chest pain [facility].  Patient states she does not know what caused it but states they found cocaine in her system.  Patient states, "I don't use cocaine I only smoked blunt.  Patient states she was having suicidal thoughts with not currently.  Patient denies HI but states she is having auditory hallucinations that tell her negative things.  Patient verbally contracts for safety.  [facility] but states she was sent out to the hospital because she had and infection to her infection to right that was ongoing prior to that admission.  Patient angry and anxious during admission.  Patient skin assessed and patient right hand has mild edema and scab which looks t be healing, surgical scars to abdomen,left leg and multiple tattoos.  Food and fluids available and patient accepted both. Consents obtained, fall safety plan explained and patient verbalized understanding.  Patient escorted and oriented to the unit.  Patient offered no additional questions or concerns.  Patient had no belongings to secure.  Patient states her belongings [place] previous hospital [place] and she states someone is supposed to send them here.

## 2014-07-13 NOTE — ED Notes (Signed)
Pt sent over to ED from MRI- [facility], after IV was inserted patient c/o chest pain to MRI staff and stated she wanted to come to ED for evaluation of this.

## 2014-07-13 NOTE — ED Notes (Signed)
[facility]. Before she could have MRI done, she developed mid cp radiating in to L shoulder and neck and back. Pt also having nausea.

## 2014-07-13 NOTE — [facility] Group Notes (Signed)
[facility] LCSW Group Therapy  07/13/2014 1:40 PM  Type of Therapy:  Group Therapy  Participation Level:  Minimal  Participation Quality:  Attentive  Affect:  Flat  Cognitive:  Alert  Insight:  Limited  Engagement in Therapy:  Limited  Modes of Intervention:  Discussion, Education, Socialization and Support  Summary of Progress/Problems:Mental [employer] (MHA) speaker came to talk about his personal journey with substance abuse and mental illness. Group members were challenged to process ways by which to relate to the speaker. MHA speaker provided handouts and educational information pertaining to groups and services offered by the [facility]. Shirley Short attended group towards the end. She was able to relate to the speaker's drug use especially cocaine. She states the first high was "better than sex" and after that you continue to try to chase that high again.    Short,Shirley 07/13/2014, 1:40 PM

## 2014-07-13 NOTE — Progress Notes (Signed)
D: Pt denies SI/HI/AVH. Pt is very irritable, and needy. Pt appears to be med seeking. Pt focus was on her hand and the pain she stated she was having.   A: Pt was offered support and encouragement. Pt was given scheduled medications. Pt was encourage to attend groups. Q 15 minute checks were done for safety.   R: Pt is taking medication.  safety maintained on unit.

## 2014-07-13 NOTE — ED Provider Notes (Signed)
CSN: [id number]     Arrival date & time 07/13/14  2130 History  This chart was scribed for Mirian MoMatthew Gentry, MD by Tanda RockersMargaux Venter, ED Scribe. This patient was seen in room D30C/D30C and the patient's care was started at 11:27 PM.    Chief Complaint  Patient presents with  . Chest Pain   The history is provided by the patient. No language interpreter was used.     HPI Comments: Shirley Short is a 54 y.o. female with hx CAD, DM, HLD, HTN who presents to the Emergency Department complaining of chest pain that began approximately 2 hours ago. She describes it as a pressure in her chest. [facility] of her hand done. She began developing chest pain while sitting and waiting to have MRI completed. Pt notes pain radiation to neck, left jaw, left shoulder, and in between shoulder blades. She mentions that the pain is a 9/10 on the pain scale. Pt cannot say if pain has any modifying factors. She also complains of nausea and shortness of breath. Pt mentions that she has chest pain on a daily basis but that is has never been this severe. She states that these symptoms are different due to the pain radiation and shortness of breath. She admits to similar symptoms in the past when pt was diagnosed with angina. Pt reports that she had an MI approximately 2 months ago and was seen here in the ED. She denies vomiting, diarrhea, constipation, cough, congestion, sore throat, or any other symptoms. Pt reports smoking marijuana 2 weeks ago that she believes was laced with cocaine due [facility] even though she has not used cocaine in quite some time.    Past Medical History  Diagnosis Date  . CAD (coronary artery disease)     a. s/p LHC on 05/2014 admission with non obst dz. Rx medically.   . Insulin dependent diabetes mellitus   . Noncompliance   . Obesity (BMI 30-39.9)   . Hyperlipidemia   . Hypertension   . Cocaine abuse    . Major depressive disorder   . Suicidal ideation   . Abscess of right hand     a. s/p ID on 05/2014 admission    Past Surgical History  Procedure Laterality Date  . Appendectomy    . Abdominal hysterectomy    . Cholecystectomy    . Left heart catheterization with coronary angiogram N/A 05/23/2014    Procedure: LEFT HEART CATHETERIZATION WITH CORONARY ANGIOGRAM;  Surgeon: Runell GessJonathan J Berry, MD;  [facility];  Service: Cardiovascular;  Laterality: N/A;  . I&d extremity Right 05/25/2014    Procedure: IRRIGATION AND DEBRIDEMENT EXTREMITY;  Surgeon: Dairl PonderMatthew Weingold, MD;  Location: [facility] OR;  Service: Orthopedics;  Laterality: Right;   Family History  Problem Relation Age of Onset  . Heart attack Mother    History  Substance Use Topics  . Smoking status: Current Every Day Smoker -- 2.00 packs/day for 30 years    Types: Cigarettes  . Smokeless tobacco: Never Used  . Alcohol Use: No   OB History    No data available     Review of Systems  HENT: Negative for congestion and sore throat.        Positive for left jaw pain.   Respiratory: Positive for shortness of breath. Negative for cough.   Cardiovascular: Positive for chest pain.  Gastrointestinal: Positive for nausea. Negative for  vomiting, diarrhea and constipation.  Musculoskeletal: Positive for back pain, arthralgias (Positive for left shoulder pain. ) and neck pain.  All other systems reviewed and are negative.     Allergies  Compazine; Quinolones; Levofloxacin; Prochlorperazine; Avelox; Levaquin; and Prochlorperazine maleate  Home Medications   Prior to Admission medications   Medication Sig Start Date End Date Taking? Authorizing Provider  amLODipine (NORVASC) 5 MG tablet Take 1 tablet (5 mg total) by mouth daily. 05/31/14  Yes Janetta Hora, PA-C  aspirin EC 81 MG EC tablet Take 1 tablet (81 mg total) by mouth daily. 05/31/14  Yes Janetta Hora, PA-C  atorvastatin (LIPITOR) 80 MG tablet Take 1 tablet (80  mg total) by mouth daily at 6 PM. 05/31/14  Yes Janetta Hora, PA-C  DULoxetine (CYMBALTA) 20 MG capsule Take 40 mg by mouth daily.    Yes Historical Provider, MD  isosorbide mononitrate (IMDUR) 30 MG 24 hr tablet Take 1 tablet (30 mg total) by mouth daily. 05/31/14  Yes Janetta Hora, PA-C  lisinopril (PRINIVIL,ZESTRIL) 20 MG tablet Take 20 mg by mouth daily. 05/10/14  Yes Historical Provider, MD  metFORMIN (GLUCOPHAGE) 500 MG tablet Take 500 mg by mouth daily.   Yes Historical Provider, MD  amitriptyline (ELAVIL) 100 MG tablet Take 1 tablet by mouth at bedtime. 05/10/14   Historical Provider, MD  doxycycline (VIBRA-TABS) 100 MG tablet Take 1 tablet (100 mg total) by mouth every 12 (twelve) hours. 06/04/14   Simbiso Ranga, MD  Fluticasone-Salmeterol (ADVAIR) 250-50 MCG/DOSE AEPB Inhale 1 puff into the lungs every 12 (twelve) hours.    Historical Provider, MD  gabapentin (NEURONTIN) 400 MG capsule Take 2 capsules (800 mg total) by mouth 3 (three) times daily. 07/12/14   Janetta Hora, PA-C  glucose blood test strip 1 Strip by Other route three times daily 12/07/11   Historical Provider, MD  hydrochlorothiazide (HYDRODIURIL) 25 MG tablet Take 25 mg by mouth every morning.    Historical Provider, MD  HYDROcodone-acetaminophen (NORCO/VICODIN) 5-325 MG per tablet Take 1-2 tablets by mouth every 4 (four) hours as needed for moderate pain. Patient not taking: Reported on 07/08/2014 06/04/14   Shirley Canal, MD  ibuprofen (ADVIL,MOTRIN) 800 MG tablet Take 800 mg by mouth 3 (three) times daily as needed for mild pain.  06/13/14   Historical Provider, MD  insulin detemir (LEVEMIR) 100 UNIT/ML injection Inject 0.2 mLs (20 Units total) into the skin at bedtime. 06/04/14   Simbiso Ranga, MD  insulin glargine (LANTUS) 100 UNIT/ML injection Inject 40 Units into the skin daily.    Historical Provider, MD  insulin regular (NOVOLIN R,HUMULIN R) 100 units/mL injection Inject into the skin 3 (three) times daily  before meals. Sliding scale    Historical Provider, MD  loratadine (CLARITIN) 10 MG tablet Take 10 mg by mouth daily. 06/08/14   Historical Provider, MD  nicotine (NICODERM CQ - DOSED IN MG/24 HOURS) 21 mg/24hr patch Place 1 patch (21 mg total) onto the skin daily. 05/31/14   Janetta Hora, PA-C  nitroGLYCERIN (NITROSTAT) 0.4 MG SL tablet Place 1 tablet (0.4 mg total) under the tongue every 5 (five) minutes x 3 doses as needed for chest pain. 07/12/14   Janetta Hora, PA-C  omeprazole (PRILOSEC) 20 MG capsule Take 20 mg by mouth daily. 04/09/11   Historical Provider, MD  pregabalin (LYRICA) 300 MG capsule Take 300 mg by mouth every 4 (four) hours as needed (For pain).     Historical Provider, MD  promethazine (PHENERGAN) 25 MG tablet Take 25 mg by mouth every 4 (four) hours. 12/11/11   Historical Provider, MD  rOPINIRole (REQUIP) 0.25 MG tablet Take 0.25 mg by mouth at bedtime. 12/06/11   Historical Provider, MD  senna-docusate (SENOKOT-S) 8.6-50 MG per tablet Take 1 tablet by mouth at bedtime as needed for mild constipation. 06/04/14   Simbiso Ranga, MD  trazodone (DESYREL) 300 MG tablet Take 1 tablet (300 mg total) by mouth at bedtime. 07/12/14   Janetta Hora, PA-C   Triage Vitals: BP 139/68 mmHg  Pulse 76  Temp(Src) 98.2 F (36.8 C) (Oral)  Resp 18  Ht  (1.626 m)  Wt 195 lb (88.451 kg)  BMI 33.46 kg/m2  SpO2 96%   Physical Exam  Constitutional: She is oriented to person, place, and time. She appears well-developed and well-nourished.  HENT:  Head: Normocephalic and atraumatic.  Right Ear: External ear normal.  Left Ear: External ear normal.  Eyes: Conjunctivae and EOM are normal. Pupils are equal, round, and reactive to light.  Neck: Normal range of motion. Neck supple.  Cardiovascular: Normal rate, regular rhythm, normal heart sounds and intact distal pulses.   Pulmonary/Chest: Effort normal and breath sounds normal.  Abdominal: Soft. Bowel sounds are normal. There is  no tenderness.  Musculoskeletal: Normal range of motion.  Neurological: She is alert and oriented to person, place, and time.  Skin: Skin is warm and dry.  Vitals reviewed.   ED Course  Procedures (including critical care time)  DIAGNOSTIC STUDIES: Oxygen Saturation is 96% on RA, normal by my interpretation.    COORDINATION OF CARE: 11:37 PM-Discussed treatment plan which includes Troponin with pt at bedside and pt agreed to plan.   Labs Review Labs Reviewed  GLUCOSE, CAPILLARY - Abnormal; Notable for the following:    Glucose-Capillary 295 (*)    All other components within normal limits  GLUCOSE, CAPILLARY - Abnormal; Notable for the following:    Glucose-Capillary 196 (*)    All other components within normal limits  GLUCOSE, CAPILLARY - Abnormal; Notable for the following:    Glucose-Capillary 169 (*)    All other components within normal limits  GLUCOSE, CAPILLARY - Abnormal; Notable for the following:    Glucose-Capillary 311 (*)    All other components within normal limits  CBC - Abnormal; Notable for the following:    Platelets 146 (*)    All other components within normal limits  BASIC METABOLIC PANEL - Abnormal; Notable for the following:    Glucose, Bld 167 (*)    GFR calc non Af Amer 71 (*)    GFR calc Af Amer 83 (*)    All other components within normal limits  HEPATIC FUNCTION PANEL - Abnormal; Notable for the following:    Albumin 3.4 (*)    AST 40 (*)    ALT 38 (*)    Total Bilirubin 0.2 (*)    Indirect Bilirubin 0.0 (*)    All other components within normal limits  URINALYSIS, ROUTINE W REFLEX MICROSCOPIC - Abnormal; Notable for the following:    APPearance CLOUDY (*)    All other components within normal limits  TROPONIN I  CBC WITH DIFFERENTIAL/PLATELET  LIPASE, BLOOD  URINE RAPID DRUG SCREEN (HOSP PERFORMED)  I-STAT TROPOININ, ED  I-STAT TROPOININ, ED  Rosezena Sensor, ED    Imaging Review Dg Chest 2 View  07/14/2014   CLINICAL DATA:   Chest pain, shortness of breath.  EXAM: CHEST  2 VIEW  COMPARISON:  07/06/2014  FINDINGS: The heart size and mediastinal contours are within normal limits. Both lungs are clear. The visualized skeletal structures are unremarkable. Surgical clips right upper quadrant.  IMPRESSION: No active cardiopulmonary disease.   Electronically Signed   By: Jearld Lesch M.D.   On: 07/14/2014 01:27   Mr Hand Right Wo Contrast  07/14/2014   CLINICAL DATA:  Acute pain in swelling  EXAM: MRI OF THE RIGHT HAND WITHOUT CONTRAST  TECHNIQUE: Multiplanar, multisequence MR imaging was performed. No intravenous contrast was administered.  COMPARISON:  None.  FINDINGS: The patient would not cooperate. Axial and coronal T1 and coronal T2 images only are obtained. These images show motion degradation. The images appear normal except for a small focus of artifact along the dorsum of the hand between the second and third metacarpals, which could be some air or metallic material. There may be mild cutaneous or subcutaneous edema. There is no evidence of a deep space or drainable fluid collection. I do not see any abnormal osseous signal.  IMPRESSION: No evidence of osteomyelitis, deep space collection or drainable collection.  Mild dorsal cutaneous and subcutaneous edema, nonspecific. Small bit of air or metallic material in the dorsal superficial tissues, possibly related to previous IV.   Electronically Signed   By: Paulina Fusi M.D.   On: 07/14/2014 02:33     EKG Interpretation None      MDM   Final diagnoses:  Chest pain, unspecified chest pain type    54 y.o. female with pertinent PMH of CAD, cocaine abuse with prior vasospasm, polysubstance abuse, current [facility] admission initially for SI, but then for polysubstance abuse, recent R hand swelling presents with chest pain after she was sent over for MRI of her hand.  On arrival pt has vitals and physical exam as above.  Pain as above atypical, nonexertional, and pt has a  daily ho chest pain.  Initial trop and ECG unremarkable.  Appearance of hand not consistent with active cellulitis, and although MRI would be reasonable, pt refused to cooperate.  I do not feel that the appearance of the pt or her hand necessitate emergent MRI at this time.  Do not feel narcotics warranted in this pt due to polysubstance abuse history, and I discussed this with the pt on initial examination.  Subsequently, pt decided to leave AMA.  She is not currently suicidal, and I feel she is competent to make decisions at this time.  DC AMA.    I have reviewed all laboratory and imaging studies if ordered as above  1. Chest pain, unspecified chest pain type   2. Tendinitis   3. Osteomyelitis           Mirian Mo, MD 07/14/14 [phone]

## 2014-07-13 NOTE — Progress Notes (Signed)
D: Pt complained of chest pain this morning. MD notified, v/s taken, EKG obtained and labs drawn. Pt refused nitroglycerin when offered stating that it gave her bad migraines. She is irritable and demanding at times. Went to groups.  A: Support given. Verbalization encouraged. Pt encouraged to come to staff for any concerns. Medications given as prescribed. R: Pt is receptive. Q15 min safety checks maintained. Pt remains safe on the unit. Will continue to monitor.

## 2014-07-13 NOTE — [facility] Suicide Risk Assessment (Signed)
[facility] INPATIENT:  Family/Significant Other Suicide Prevention Education  Suicide Prevention Education:  Patient Refusal for Family/Significant Other Suicide Prevention Education: The patient Shirley SimmondsDonna L Short has refused to provide written consent for family/significant other to be provided Family/Significant Other Suicide Prevention Education during admission and/or prior to discharge.  Physician notified.  Hyatt,Candace 07/13/2014, 12:49 PM

## 2014-07-14 ENCOUNTER — Inpatient Hospital Stay (HOSPITAL_COMMUNITY): Payer: Federal, State, Local not specified - Other

## 2014-07-14 DIAGNOSIS — L03818 Cellulitis of other sites: Secondary | ICD-10-CM | POA: Insufficient documentation

## 2014-07-14 DIAGNOSIS — F1994 Other psychoactive substance use, unspecified with psychoactive substance-induced mood disorder: Secondary | ICD-10-CM

## 2014-07-14 DIAGNOSIS — R0789 Other chest pain: Secondary | ICD-10-CM | POA: Insufficient documentation

## 2014-07-14 LAB — HEPATIC FUNCTION PANEL
ALK PHOS: 89 U/L (ref 39–117)
ALT: 38 U/L — ABNORMAL HIGH (ref 0–35)
AST: 40 U/L — ABNORMAL HIGH (ref 0–37)
Albumin: 3.4 g/dL — ABNORMAL LOW (ref 3.5–5.2)
BILIRUBIN INDIRECT: 0 mg/dL — AB (ref 0.3–0.9)
BILIRUBIN TOTAL: 0.2 mg/dL — AB (ref 0.3–1.2)
Bilirubin, Direct: 0.2 mg/dL (ref 0.0–0.5)
TOTAL PROTEIN: 7.2 g/dL (ref 6.0–8.3)

## 2014-07-14 LAB — GLUCOSE, CAPILLARY
GLUCOSE-CAPILLARY: 178 mg/dL — AB (ref 70–99)
Glucose-Capillary: 184 mg/dL — ABNORMAL HIGH (ref 70–99)
Glucose-Capillary: 164 mg/dL — ABNORMAL HIGH (ref 70–99)
Glucose-Capillary: 144 mg/dL — ABNORMAL HIGH (ref 70–99)

## 2014-07-14 LAB — URINALYSIS, ROUTINE W REFLEX MICROSCOPIC
Bilirubin Urine: NEGATIVE
Glucose, UA: NEGATIVE mg/dL
HGB URINE DIPSTICK: NEGATIVE
KETONES UR: NEGATIVE mg/dL
Leukocytes, UA: NEGATIVE
Nitrite: NEGATIVE
PROTEIN: NEGATIVE mg/dL
Specific Gravity, Urine: 1.005 (ref 1.005–1.030)
Urobilinogen, UA: 0.2 mg/dL (ref 0.0–1.0)
pH: 7 (ref 5.0–8.0)

## 2014-07-14 LAB — RAPID URINE DRUG SCREEN, HOSP PERFORMED
AMPHETAMINES: NOT DETECTED
Barbiturates: NOT DETECTED
Benzodiazepines: NOT DETECTED
COCAINE: NOT DETECTED
OPIATES: NOT DETECTED
Tetrahydrocannabinol: NOT DETECTED

## 2014-07-14 LAB — CBG MONITORING, ED: GLUCOSE-CAPILLARY: 160 mg/dL — AB (ref 70–99)

## 2014-07-14 LAB — LIPASE, BLOOD: LIPASE: 22 U/L (ref 11–59)

## 2014-07-14 NOTE — Discharge Instructions (Signed)

## 2014-07-14 NOTE — Progress Notes (Signed)
Adult Psychoeducational Group Note  Date:  07/14/2014 Time:  10:13 PM  Group Topic/Focus:  Wrap-Up Group:   The focus of this group is to help patients review their daily goal of treatment and discuss progress on daily workbooks.  Participation Level:  Active  Participation Quality:  Appropriate  Affect:  Appropriate  Cognitive:  Appropriate  Insight: Appropriate  Engagement in Group:  Engaged  Modes of Intervention:  Activity  Additional Comments:  Carlynn PurlKaraoke   Deante Blough H 07/14/2014, 10:13 PM

## 2014-07-14 NOTE — ED Notes (Signed)
Patient refused troponin lab draw, "i'm not getting anymore blood draws, just call transportation"

## 2014-07-14 NOTE — Progress Notes (Signed)
Pt back on the unit at 0400. Pt requesting  "night meds, trazodone, Ativan". Pt informed that we do not give sleep meds after 0200. Pt was also informed [facility] Pt informed that the order we have is for 0.5 mg of Ativan Q6 and if she would like more she can talk to the doctor. [facility] and refused [facility] , refused to get Troponin levels drawn , pt refused Nitro when her chest was hurting at the ER. PT continues to only want pain medications, but pt did not cooperate with assessment of her symptoms.

## 2014-07-14 NOTE — [facility] Group Notes (Signed)
[facility] LCSW Group Therapy  07/14/2014 2:14 PM  Type of Therapy:  Group Therapy  Participation Level:  Active  Participation Quality:  Attentive  Affect:  Flat  Cognitive:  Alert  Insight:  Improving  Engagement in Therapy:  Improving  Modes of Intervention:  Discussion, Socialization and Support  Summary of Progress/Problems:Feelings around Relapse. Group members discussed the meaning of relapse and shared personal stories of relapse, how it affected them and others, and how they perceived themselves during this time. Group members were encouraged to identify triggers, warning signs and coping skills used when facing the possibility of relapse. Social supports were discussed and explored in detail. Shirley Short states when she is feeling depressed that she has urges to use crack. After using crack, she feels better for a few minutes but then feels more depressed. She reports spending $2,000-$3,000 worth of crack per month. She discussed consequences of using crack such as homelessness, lack of financial stability, making poor choices and health problems. She wants to move away from her current living situation because this is a trigger for her. She expressed interest in participating in an inpatient substance abuse program as well.      Hyatt,Candace 07/14/2014, 2:14 PM

## 2014-07-14 NOTE — Progress Notes (Signed)
[facility] MD Progress Note  07/14/2014 1:07 PM Shirley Short  MRN:  [id number] Subjective:  Patient reports ongoing pain, and is reporting R hand pain. Objective : I have discussed case with treatment team and have met with patient . She remains dysphoric, irritable, medication seeking. Her R hand seems much improved compared to admission- inflammation is decreased.  Appreciate Hospitalist Consult- at this time patient being managed with Doxycycline. Denies medication side effects. Although irritable, was more amenable to discuss issues regarding substance abuse, she acknowledges cocaine abuse and the negative impact it has had on her life. States she usually relapses " when I hang out with people who are using". She does express interest in going to an inpatient rehab. Setting after discharge if possible. On unit, no disruptive behaviors, tends to isolate in her room, and as per nursing staff, to be irritable and medication seeking. Principal Problem: Major depressive disorder, recurrent, severe without psychotic features Diagnosis:   Patient Active Problem List   Diagnosis Date Noted  . Cellulitis and abscess of hand [L03.119, L02.519] 07/13/2014  . Major depressive disorder, recurrent, severe without psychotic features [F33.2]   . Cocaine abuse with cocaine-induced mood disorder [F14.14]   . Psychoactive substance-induced mood disorder [F19.94, F06.30] 07/12/2014  . Substance induced mood disorder [F19.94] 07/12/2014  . Chest pain [R07.9] 07/07/2014  . Chronic pain disorder [G89.4] 05/31/2014  . MDD (major depressive disorder) [F32.2] 05/31/2014  . Abscess of right hand [L02.511]   . CAD (coronary artery disease) [I25.10]   . MDD (major depressive disorder), recurrent, severe, with psychosis [F33.3] 05/29/2014  . Cocaine abuse [F14.10] 05/23/2014  . Obesity (BMI 30-39.9) [E66.9] 05/23/2014  . COPD (chronic obstructive pulmonary disease) [J44.9] 05/23/2014  . Hyperlipidemia [E78.5]  05/23/2014  . DM2 (diabetes mellitus, type 2) [E11.9]   . Noncompliance [Z91.19]   . Tobacco abuse [Z72.0]   . Hypertension [I10]    Total Time spent with patient: 25 minutes   Past Medical History:  Past Medical History  Diagnosis Date  . CAD (coronary artery disease)     a. s/p LHC on 05/2014 admission with non obst dz. Rx medically.   . Insulin dependent diabetes mellitus   . Noncompliance   . Obesity (BMI 30-39.9)   . Hyperlipidemia   . Hypertension   . Cocaine abuse   . Major depressive disorder   . Suicidal ideation   . Abscess of right hand     a. s/p ID on 05/2014 admission     Past Surgical History  Procedure Laterality Date  . Appendectomy    . Abdominal hysterectomy    . Cholecystectomy    . Left heart catheterization with coronary angiogram N/A 05/23/2014    Procedure: LEFT HEART CATHETERIZATION WITH CORONARY ANGIOGRAM;  Surgeon: Lorretta Harp, MD;  Location: [facility] CATH LAB;  Service: Cardiovascular;  Laterality: N/A;  . I&d extremity Right 05/25/2014    Procedure: IRRIGATION AND DEBRIDEMENT EXTREMITY;  Surgeon: Charlotte Crumb, MD;  Location: [place];  Service: Orthopedics;  Laterality: Right;   Family History:  Family History  Problem Relation Age of Onset  . Heart attack Mother    Social History:  History  Alcohol Use No     History  Drug Use  . Yes  . Special: Cocaine    Comment: Cocaine    History   Social History  . Marital Status: Divorced    Spouse Name: N/A  . Number of Children: N/A  . Years of Education: N/A  Social History Main Topics  . Smoking status: Current Every Day Smoker -- 2.00 packs/day for 30 years    Types: Cigarettes  . Smokeless tobacco: Never Used  . Alcohol Use: No  . Drug Use: Yes    Special: Cocaine     Comment: Cocaine  . Sexual Activity: Not on file   Other Topics Concern  . None   Social History Narrative   Additional History:    Sleep: Fair  Appetite:  Fair   Assessment:    Musculoskeletal: Strength & Muscle Tone: within normal limits Gait & Station: normal Patient leans: N/A   Psychiatric Specialty Exam: Physical Exam  Review of Systems  Constitutional: Negative for fever and chills.  Eyes: Negative.   Respiratory: Negative for cough and shortness of breath.   Cardiovascular: Negative for chest pain.  Gastrointestinal: Negative for vomiting.  Genitourinary: Negative for dysuria, urgency and frequency.  Skin: Negative.  Negative for rash.       Less erythema, less edema on R hand   Neurological: Negative for seizures and headaches.  Psychiatric/Behavioral: Positive for depression and substance abuse.    Blood pressure 153/58, pulse 76, temperature 97.9 F (36.6 C), temperature source Oral, resp. rate 18, height 5' 4"  (1.626 m), weight 195 lb (88.451 kg), SpO2 100 %.Body mass index is 33.46 kg/(m^2).  General Appearance: Fairly Groomed  Engineer, water::  Good  Speech:  Normal Rate  Volume:  Normal  Mood:  Dysphoric  Affect:  Constricted and somewhat irritable   Thought Process:  Linear  Orientation:  Other:  fully alert and attentive  Thought Content:  no hallucinations, no delusions  Suicidal Thoughts:  No at present denies any thoughts of hurting self or anyone else   Homicidal Thoughts:  No  Memory:   Recent and remote grossly intact   Judgement:  Fair  Insight:  Fair  Psychomotor Activity:  Decreased  Concentration:  Good  Recall:  Good  Fund of Knowledge:Good  Language: Good  Akathisia:  Negative  Handed:  Right  AIMS (if indicated):     Assets:  Desire for Improvement Resilience  ADL's:  Impaired  Cognition: WNL  Sleep:        Current Medications: Current Facility-Administered Medications  Medication Dose Route Frequency Provider Last Rate Last Dose  . acetaminophen (TYLENOL) tablet 650 mg  650 mg Oral Q6H PRN Laverle Hobby, PA-C   650 mg at 07/13/14 1718  . alum & mag hydroxide-simeth (MAALOX/MYLANTA) 200-200-20 MG/5ML  suspension 30 mL  30 mL Oral Q4H PRN Laverle Hobby, PA-C      . amLODipine (NORVASC) tablet 5 mg  5 mg Oral Daily Laverle Hobby, PA-C   5 mg at 07/14/14 9935  . aspirin EC tablet 81 mg  81 mg Oral Daily Laverle Hobby, PA-C   81 mg at 07/14/14 0840  . atorvastatin (LIPITOR) tablet 80 mg  80 mg Oral q1800 Laverle Hobby, PA-C   80 mg at 07/13/14 1718  . doxycycline (VIBRA-TABS) tablet 100 mg  100 mg Oral Q12H Venetia Maxon Rama, MD   100 mg at 07/14/14 0841  . DULoxetine (CYMBALTA) DR capsule 40 mg  40 mg Oral Daily Laverle Hobby, PA-C   40 mg at 07/14/14 0841  . gabapentin (NEURONTIN) capsule 800 mg  800 mg Oral TID Laverle Hobby, PA-C   800 mg at 07/14/14 1150  . hydrochlorothiazide (HYDRODIURIL) tablet 25 mg  25 mg Oral BH-q7a Laverle Hobby, PA-C  25 mg at 07/13/14 0820  . hydrOXYzine (ATARAX/VISTARIL) tablet 10 mg  10 mg Oral Q6H PRN Jenne Campus, MD   10 mg at 07/13/14 1326  . ibuprofen (ADVIL,MOTRIN) tablet 800 mg  800 mg Oral TID PRN Laverle Hobby, PA-C   800 mg at 07/13/14 1353  . insulin aspart (novoLOG) injection 0-20 Units  0-20 Units Subcutaneous TID WC Laverle Hobby, PA-C   4 Units at 07/14/14 1150  . insulin aspart (novoLOG) injection 0-5 Units  0-5 Units Subcutaneous QHS Laverle Hobby, PA-C   3 Units at 07/12/14 2256  . insulin aspart (novoLOG) injection 4 Units  4 Units Subcutaneous TID WC Laverle Hobby, PA-C   4 Units at 07/14/14 1151  . insulin glargine (LANTUS) injection 50 Units  50 Units Subcutaneous Daily Venetia Maxon Rama, MD   50 Units at 07/14/14 0934  . isosorbide mononitrate (IMDUR) 24 hr tablet 30 mg  30 mg Oral Daily Laverle Hobby, PA-C   30 mg at 07/14/14 0840  . lisinopril (PRINIVIL,ZESTRIL) tablet 20 mg  20 mg Oral Daily Laverle Hobby, PA-C   20 mg at 07/14/14 0840  . loratadine (CLARITIN) tablet 10 mg  10 mg Oral Daily Laverle Hobby, PA-C   10 mg at 07/14/14 0840  . LORazepam (ATIVAN) tablet 0.5 mg  0.5 mg Oral Q6H PRN Kerrie Buffalo, NP    0.5 mg at 07/13/14 1627  . magnesium hydroxide (MILK OF MAGNESIA) suspension 30 mL  30 mL Oral Daily PRN Laverle Hobby, PA-C      . metFORMIN (GLUCOPHAGE) tablet 500 mg  500 mg Oral Q breakfast Laverle Hobby, PA-C   500 mg at 07/14/14 3664  . mometasone-formoterol (DULERA) 100-5 MCG/ACT inhaler 2 puff  2 puff Inhalation BID Laverle Hobby, PA-C   2 puff at 07/14/14 [phone]  . nicotine (NICODERM CQ - dosed in mg/24 hours) patch 21 mg  21 mg Transdermal Daily Laverle Hobby, PA-C   21 mg at 07/14/14 7425  . pantoprazole (PROTONIX) EC tablet 40 mg  40 mg Oral Daily Laverle Hobby, PA-C   40 mg at 07/14/14 0840  . pregabalin (LYRICA) capsule 100 mg  100 mg Oral Q4H PRN Laverle Hobby, PA-C   100 mg at 07/13/14 1210  . rOPINIRole (REQUIP) tablet 0.25 mg  0.25 mg Oral QHS Laverle Hobby, PA-C   0.25 mg at 07/14/14 0004  . senna-docusate (Senokot-S) tablet 1 tablet  1 tablet Oral QHS PRN Laverle Hobby, PA-C      . traZODone (DESYREL) tablet 150 mg  150 mg Oral QHS Kerrie Buffalo, NP   Stopped at 07/14/14 0030    Lab Results:  Results for orders placed or performed during the hospital encounter of 07/12/14 (from the past 48 hour(s))  Glucose, capillary     Status: Abnormal   Collection Time: 07/12/14 10:44 PM  Result Value Ref Range   Glucose-Capillary 295 (H) 70 - 99 mg/dL  Glucose, capillary     Status: Abnormal   Collection Time: 07/13/14  6:12 AM  Result Value Ref Range   Glucose-Capillary 196 (H) 70 - 99 mg/dL  Glucose, capillary     Status: Abnormal   Collection Time: 07/13/14 11:15 AM  Result Value Ref Range   Glucose-Capillary 169 (H) 70 - 99 mg/dL   Comment 1 Notify RN    Comment 2 Document in Chart   Troponin I     Status: None  Collection Time: 07/13/14 11:45 AM  Result Value Ref Range   Troponin I <0.03 <0.031 ng/mL    Comment:        NO INDICATION OF MYOCARDIAL INJURY. Performed at [facility]   CBC with Differential/Platelet     Status: None    Collection Time: 07/13/14 11:45 AM  Result Value Ref Range   WBC 7.8 4.0 - 10.5 K/uL   RBC 4.54 3.87 - 5.11 MIL/uL   Hemoglobin 12.6 12.0 - 15.0 g/dL   HCT 38.5 36.0 - 46.0 %   MCV 84.8 78.0 - 100.0 fL   MCH 27.8 26.0 - 34.0 pg   MCHC 32.7 30.0 - 36.0 g/dL   RDW 13.6 11.5 - 15.5 %   Platelets 158 150 - 400 K/uL   Neutrophils Relative % 67 43 - 77 %   Neutro Abs 5.2 1.7 - 7.7 K/uL   Lymphocytes Relative 26 12 - 46 %   Lymphs Abs 2.0 0.7 - 4.0 K/uL   Monocytes Relative 6 3 - 12 %   Monocytes Absolute 0.5 0.1 - 1.0 K/uL   Eosinophils Relative 1 0 - 5 %   Eosinophils Absolute 0.1 0.0 - 0.7 K/uL   Basophils Relative 0 0 - 1 %   Basophils Absolute 0.0 0.0 - 0.1 K/uL    Comment: Performed at [facility]  Glucose, capillary     Status: Abnormal   Collection Time: 07/13/14  5:11 PM  Result Value Ref Range   Glucose-Capillary 311 (H) 70 - 99 mg/dL   Comment 1 Notify RN    Comment 2 Document in Chart   Hepatic function panel     Status: Abnormal   Collection Time: 07/13/14 10:40 PM  Result Value Ref Range   Total Protein 7.2 6.0 - 8.3 g/dL   Albumin 3.4 (L) 3.5 - 5.2 g/dL   AST 40 (H) 0 - 37 U/L   ALT 38 (H) 0 - 35 U/L   Alkaline Phosphatase 89 39 - 117 U/L   Total Bilirubin 0.2 (L) 0.3 - 1.2 mg/dL   Bilirubin, Direct 0.2 0.0 - 0.5 mg/dL   Indirect Bilirubin 0.0 (L) 0.3 - 0.9 mg/dL  Lipase, blood     Status: None   Collection Time: 07/13/14 10:40 PM  Result Value Ref Range   Lipase 22 11 - 59 U/L  CBC     Status: Abnormal   Collection Time: 07/13/14 10:44 PM  Result Value Ref Range   WBC 7.4 4.0 - 10.5 K/uL   RBC 4.91 3.87 - 5.11 MIL/uL   Hemoglobin 13.8 12.0 - 15.0 g/dL   HCT 40.9 36.0 - 46.0 %   MCV 83.3 78.0 - 100.0 fL   MCH 28.1 26.0 - 34.0 pg   MCHC 33.7 30.0 - 36.0 g/dL   RDW 13.9 11.5 - 15.5 %   Platelets 146 (L) 150 - 400 K/uL  Basic metabolic panel     Status: Abnormal   Collection Time: 07/13/14 10:44 PM  Result Value Ref Range   Sodium 135  135 - 145 mmol/L   Potassium 4.4 3.5 - 5.1 mmol/L   Chloride 98 96 - 112 mmol/L   CO2 27 19 - 32 mmol/L   Glucose, Bld 167 (H) 70 - 99 mg/dL   BUN 17 6 - 23 mg/dL   Creatinine, Ser 0.90 0.50 - 1.10 mg/dL   Calcium 10.1 8.4 - 10.5 mg/dL   GFR calc non Af Amer 71 (L) >90  mL/min   GFR calc Af Amer 83 (L) >90 mL/min    Comment: (NOTE) The eGFR has been calculated using the CKD EPI equation. This calculation has not been validated in all clinical situations. eGFR's persistently <90 mL/min signify possible Chronic Kidney Disease.    Anion gap 10 5 - 15  I-stat troponin, ED (not at [facility])     Status: None   Collection Time: 07/13/14 10:48 PM  Result Value Ref Range   Troponin i, poc 0.00 0.00 - 0.08 ng/mL   Comment 3            Comment: Due to the release kinetics of cTnI, a negative result within the first hours of the onset of symptoms does not rule out myocardial infarction with certainty. If myocardial infarction is still suspected, repeat the test at appropriate intervals.   CBG monitoring, ED     Status: Abnormal   Collection Time: 07/13/14 11:52 PM  Result Value Ref Range   Glucose-Capillary 160 (H) 70 - 99 mg/dL  Urinalysis, Routine w reflex microscopic     Status: Abnormal   Collection Time: 07/14/14  3:27 AM  Result Value Ref Range   Color, Urine YELLOW YELLOW   APPearance CLOUDY (A) CLEAR   Specific Gravity, Urine 1.005 1.005 - 1.030   pH 7.0 5.0 - 8.0   Glucose, UA NEGATIVE NEGATIVE mg/dL   Hgb urine dipstick NEGATIVE NEGATIVE   Bilirubin Urine NEGATIVE NEGATIVE   Ketones, ur NEGATIVE NEGATIVE mg/dL   Protein, ur NEGATIVE NEGATIVE mg/dL   Urobilinogen, UA 0.2 0.0 - 1.0 mg/dL   Nitrite NEGATIVE NEGATIVE   Leukocytes, UA NEGATIVE NEGATIVE    Comment: MICROSCOPIC NOT DONE ON URINES WITH NEGATIVE PROTEIN, BLOOD, LEUKOCYTES, NITRITE, OR GLUCOSE <1000 mg/dL.  Drug screen panel, emergency     Status: None   Collection Time: 07/14/14  3:28 AM  Result Value Ref Range    Opiates NONE DETECTED NONE DETECTED   Cocaine NONE DETECTED NONE DETECTED   Benzodiazepines NONE DETECTED NONE DETECTED   Amphetamines NONE DETECTED NONE DETECTED   Tetrahydrocannabinol NONE DETECTED NONE DETECTED   Barbiturates NONE DETECTED NONE DETECTED    Comment:        DRUG SCREEN FOR MEDICAL PURPOSES ONLY.  IF CONFIRMATION IS NEEDED FOR ANY PURPOSE, NOTIFY LAB WITHIN 5 DAYS.        LOWEST DETECTABLE LIMITS FOR URINE DRUG SCREEN Drug Class       Cutoff (ng/mL) Amphetamine      1000 Barbiturate      200 Benzodiazepine   412 Tricyclics       878 Opiates          300 Cocaine          300 THC              50   Glucose, capillary     Status: Abnormal   Collection Time: 07/14/14  6:28 AM  Result Value Ref Range   Glucose-Capillary 184 (H) 70 - 99 mg/dL  Glucose, capillary     Status: Abnormal   Collection Time: 07/14/14 11:29 AM  Result Value Ref Range   Glucose-Capillary 178 (H) 70 - 99 mg/dL   Comment 1 Notify RN    Comment 2 Document in Chart     Physical Findings: AIMS:  , ,  ,  ,    CIWA:    COWS:      Assessment- at this time patient remains depressed and dysphoric, irritable. She  is medication seeking , requesting opiates and BZDs. She is , however, acknowledging cocaine abuse and negative impact of this, and expressing interest in going to a Rehab after discharge. Hand infection visibly improved compared to yesterday and hand MRI unremarkable, except for soft tissue edema. Tolerating medications well thus far.  Treatment Plan Summary: Daily contact with patient to assess and evaluate symptoms and progress in treatment, Medication management, Plan ongoing inpatient admission and medication management as below  Neurontin 800 mgrds TID- for pain/anxiety Cymbalta 30 mgrs QDAY - for depression  Requip 0.25 mgrs QHS for restless legs Trazodone for insomnia Now on Doxycycline for cellulitis.     Medical Decision Making:  Established Problem, Stable/Improving  (1), Review of Psycho-Social Stressors (1), Review or order clinical lab tests (1) and Review of Medication Regimen & Side Effects (2)     COBOS, FERNANDO 07/14/2014, 1:07 PM

## 2014-07-14 NOTE — ED Notes (Signed)
Discussed with Dr. Littie DeedsGentry that patient is ready to leave AMA.

## 2014-07-14 NOTE — Progress Notes (Signed)
PROGRESS NOTE    Shirley Short ZOX:[id number] DOB: 30-Mar-1960 DOA: 07/12/2014 PCP: No PCP Per Patient  HPI/Brief narrative TRH was consulted on 07/13/14 for evaluation of right hand infection and chest pain. Patient is a 54 year old female [facility] on 07/13/14 for substance induced mood disorder, major depressive disorder, recurrent, severe without psychotic features. She has past medical history significant for continued tobacco abuse, polysubstance abuse (cocaine/opiates), chronic pain syndrome, chronic LBBB, major depressive disorder, COPD, DM, HLD and hypertension. She was hospitalized 4/14-4/19 under cardiology service, after transfer from [facility] to [facility] for evaluation of chest pain. UDS was positive for cocaine. Her chest pain episode was felt to be secondary to coronary spasm. Following discharge, she was admitted to [facility]. She was hospitalized to [facility] in 2010 with cocaine induced chest pain and had cath showing distal LAD disease. She was admitted in 2/16 to [facility] with chest pain in the setting of cocaine use and cardiac cath showed moderate CAD.  TRH was initially asked to evaluate the patient who began to complain of chest pain earlier on 4/20. An EKG done at the time of the patient's complaint showed no ischemic changes and was similar to her baseline EKG. She refused nitroglycerin, when offered. Pain was substernal, atypical (non-exertional), dull, without radiation or associated nausea, vomiting or diaphoresis. When she was not given additional pain medication, she began to report pain in her right hand, at the site of a prior IV site, which she described as "excrutiating", and was unrelieved by Ibuprofen, throbbing in quality, 10/10. She insisted that the consulting MD prescribe her something stronger for her pain, and threatened to leave AMA if she wasn't given something stronger. She endorsed subjective fever, although no fever has  been documented. She has a history of a + staph aureus wound culture 05/25/14 and underwent right hand I&D by Dr. Mina Marble and was supposed to F/U with him post operatively and take doxycycline x 3 weeks (fluoroquinolone allergic). She was discharged to [facility] after this orthopedic intervention, then re-admitted to [facility] 06/01/14 due to worsening cellulitis, and was put on Cefepime and Vancomycin. She was subsequently discharged on doxycycline 06/04/14.   Assessment/Plan:  Principal Problem:  Major depressive disorder, recurrent, severe without psychotic features/substance induced mood disorder/cocaine abuse with cocaine induced mood disorder - Management per primary team. - On Cymbalta.  Active Problems:  Chest pain in a patient with moderate CAD - Avoid narcotics given history of polysubstance abuse. - EKG/troponin negative for ischemia. - Can treat with PRN nitroglycerin. - Continue ASA, statin, Imdur. - When patient went to [facility] for MRI off right hand on 4/20, she started complaining of chest pain and was evaluated in the ED. Her pain was atypical, initial troponin and EKG were unremarkable. - Chest x-ray without acute findings. - Did not complain of chest pain today.   ? Cellulitis of right hand - Started doxycycline 4/20, has history of MSSA wound infection, doxycycline senstive. - MRI does not show any acute findings but shows small bit of air or metallic material in the dorsal superficial tissues - Recommend completing total 1 week of doxycycline - Recommend outpatient follow-up with Dr. Mina Marble, hand surgeon regarding possible foreign body. - TRH will sign off on 07/14/14. Please contact us for any further assistance.   Hypertension - Continue Norvasc, lisinopril and HCTZ. - Fluctuating and mildly uncontrolled   Diabetes with neurological complications - Continue resistant scale SSI, 4 units of NovoLOG Q AC.  CBGs 160-311.  - Increased Lantus to 50 units. - Continue  metformin.   Abnormal LFTs - Unclear etiology - Reported GI symptoms - Significantly improved - Outpatient follow-up with repeat LFTs in 1-2 weeks.  Mild thrombocytopenia - Seems intermittent and chronic - Stable   Code Status: Full Family Communication: None at bedside Disposition Plan: Per primary service   Consultants:  None  Procedures:  None  Antibiotics:  Doxycycline 4/20>   Subjective: Complaints of pain dorsum of right hand-states that it is unchanged.  Objective: Filed Vitals:   07/14/14 0030 07/14/14 0045 07/14/14 0229 07/14/14 0830  BP: 145/54 170/89 119/74 153/58  Pulse: 76 79 74 76  Temp:   97.9 F (36.6 C)   TempSrc:   Oral   Resp: 17 23 18 18   Height:      Weight:      SpO2: 96% 99% 100% 100%   No intake or output data in the 24 hours ending 07/14/14 1740 Filed Weights   07/12/14 2030 07/13/14 2138  Weight: 88.451 kg (195 lb) 88.451 kg (195 lb)     Exam:  General exam: Pleasant middle-aged female ambulating comfortably in the halls. Respiratory system: Clear. No increased work of breathing. Cardiovascular system: S1 & S2 heard, RRR. No JVD, murmurs, gallops, clicks or pedal edema. Gastrointestinal system: Abdomen is nondistended, soft and nontender. Normal bowel sounds heard. Central nervous system: Alert and oriented. No focal neurological deficits. Extremities: Symmetric 5 x 5 power. Dorsum of right hand with approximately 1.5 cm linear scab in the middle with mild periscab erythema and subjective/inconsistent tenderness but no obvious swelling, fluctuance or crepitus.   Data Reviewed: Basic Metabolic Panel:  Recent Labs Lab 07/08/14 0025 07/11/14 0716 07/13/14 2244  NA 134*  134* 137 135  K 4.0  3.9 4.8 4.4  CL 104  104 103 98  CO2 22  21 27 27   GLUCOSE 102*  103* 262* 167*  BUN 18  18 12 17   CREATININE 1.08  1.07 0.71 0.90  CALCIUM 8.7  8.6 9.5 10.1   Liver Function Tests:  Recent Labs Lab 07/08/14 0025  07/13/14 2240  AST 190* 40*  ALT 144* 38*  ALKPHOS 119* 89  BILITOT 0.3 0.2*  PROT 5.6* 7.2  ALBUMIN 3.0* 3.4*    Recent Labs Lab 07/13/14 2240  LIPASE 22   No results for input(s): AMMONIA in the last 168 hours. CBC:  Recent Labs Lab 07/08/14 0025 07/11/14 0716 07/13/14 1145 07/13/14 2244  WBC 13.2* 6.9 7.8 7.4  NEUTROABS 9.5*  --  5.2  --   HGB 12.8 11.9* 12.6 13.8  HCT 39.6 36.6 38.5 40.9  MCV 85.3 85.7 84.8 83.3  PLT 159 128* 158 146*   Cardiac Enzymes:  Recent Labs Lab 07/08/14 0025 07/08/14 1103 07/13/14 1145  TROPONINI <0.03  <0.03 <0.03 <0.03   BNP (last 3 results) No results for input(s): PROBNP in the last 8760 hours. CBG:  Recent Labs Lab 07/13/14 1711 07/13/14 2352 07/14/14 0628 07/14/14 1129 07/14/14 1630  GLUCAP 311* 160* 184* 178* 164*    Recent Results (from the past 240 hour(s))  MRSA PCR Screening     Status: None   Collection Time: 07/07/14  9:59 PM  Result Value Ref Range Status   MRSA by PCR NEGATIVE NEGATIVE Final    Comment:        The GeneXpert MRSA Assay (FDA approved for NASAL specimens only), is one component of a comprehensive MRSA colonization surveillance program. It  is not intended to diagnose MRSA infection nor to guide or monitor treatment for MRSA infections.           Studies: Dg Chest 2 View  07/14/2014   CLINICAL DATA:  Chest pain, shortness of breath.  EXAM: CHEST  2 VIEW  COMPARISON:  07/06/2014  FINDINGS: The heart size and mediastinal contours are within normal limits. Both lungs are clear. The visualized skeletal structures are unremarkable. Surgical clips right upper quadrant.  IMPRESSION: No active cardiopulmonary disease.   Electronically Signed   By: Jearld Lesch M.D.   On: 07/14/2014 01:27   Mr Hand Right Wo Contrast  07/14/2014   CLINICAL DATA:  Acute pain in swelling  EXAM: MRI OF THE RIGHT HAND WITHOUT CONTRAST  TECHNIQUE: Multiplanar, multisequence MR imaging was performed. No  intravenous contrast was administered.  COMPARISON:  None.  FINDINGS: The patient would not cooperate. Axial and coronal T1 and coronal T2 images only are obtained. These images show motion degradation. The images appear normal except for a small focus of artifact along the dorsum of the hand between the second and third metacarpals, which could be some air or metallic material. There may be mild cutaneous or subcutaneous edema. There is no evidence of a deep space or drainable fluid collection. I do not see any abnormal osseous signal.  IMPRESSION: No evidence of osteomyelitis, deep space collection or drainable collection.  Mild dorsal cutaneous and subcutaneous edema, nonspecific. Small bit of air or metallic material in the dorsal superficial tissues, possibly related to previous IV.   Electronically Signed   By: Paulina Fusi M.D.   On: 07/14/2014 02:33        Scheduled Meds: . amLODipine  5 mg Oral Daily  . aspirin EC  81 mg Oral Daily  . atorvastatin  80 mg Oral q1800  . doxycycline  100 mg Oral Q12H  . DULoxetine  40 mg Oral Daily  . gabapentin  800 mg Oral TID  . hydrochlorothiazide  25 mg Oral BH-q7a  . insulin aspart  0-20 Units Subcutaneous TID WC  . insulin aspart  0-5 Units Subcutaneous QHS  . insulin aspart  4 Units Subcutaneous TID WC  . insulin glargine  50 Units Subcutaneous Daily  . isosorbide mononitrate  30 mg Oral Daily  . lisinopril  20 mg Oral Daily  . loratadine  10 mg Oral Daily  . metFORMIN  500 mg Oral Q breakfast  . mometasone-formoterol  2 puff Inhalation BID  . nicotine  21 mg Transdermal Daily  . pantoprazole  40 mg Oral Daily  . rOPINIRole  0.25 mg Oral QHS  . trazodone  150 mg Oral QHS   Continuous Infusions:   Principal Problem:   Major depressive disorder, recurrent, severe without psychotic features Active Problems:   DM2 (diabetes mellitus, type 2)   Hypertension   CAD (coronary artery disease)   Chest pain   Substance induced mood disorder    Cocaine abuse with cocaine-induced mood disorder   Cellulitis and abscess of hand    Time spent: 30 minutes.    Marcellus Scott, MD, FACP, FHM. [facility] Pager [phone]  If 7PM-7AM, please contact night-coverage [email or web address] Password [email or web address] 07/14/2014, 5:40 PM    LOS: 2 days

## 2014-07-14 NOTE — ED Notes (Signed)
Discussed need for urine sample. "yal not gonna get one from me. You should have asked me the first three times i went".

## 2014-07-14 NOTE — Progress Notes (Signed)
D: Pt's mood is depressed. She is irritable and demanding. She denies SI/HI/AVH.  A: Support given. Verbalization encouraged. Pt encouraged to come to staff for any concerns. Medications given as prescribed. R: Pt is receptive. No complaints of pain or discomfort at this time. Q15 min safety checks maintained. Pt remains safe on the unit. Will continue to monitor.

## 2014-07-14 NOTE — ED Notes (Signed)
Discussed plan of care with Dr. Littie DeedsGentry, patient is not allowing mri imaging, not cooperating with positioning and requesting pain medication. MD akcnowledges, patient is to return to room, no orders for pain medication, and patient is already aware. Informed MRI staff.

## 2014-07-14 NOTE — Progress Notes (Signed)
D: Pt denies SI/HI/AVH.  Pt argumentative and irritable.   A: Pt was offered support and encouragement. Pt was given scheduled medications. Pt was encourage to attend groups. Q 15 minute checks were done for safety. Informed pt that she needs to drink more water due to the processes in her system and  The fact she is taking and antibiotic .   R:Pt attends groups and interacts well with peers and staff. Pt is taking medication. Pt receptive to treatment and safety maintained on unit. Pt responded " I don't drink water and I don't like water.

## 2014-07-14 NOTE — ED Notes (Signed)
Transport left name and number for transfer back to behavioral: Shirley Short [phone]

## 2014-07-14 NOTE — ED Notes (Signed)
MRI called and requests medication for pt rt pt reports being claustrophobic. Pt has already received ativan. MRI made aware pt has already been medicated.

## 2014-07-15 LAB — GLUCOSE, CAPILLARY
GLUCOSE-CAPILLARY: 142 mg/dL — AB (ref 70–99)
Glucose-Capillary: 191 mg/dL — ABNORMAL HIGH (ref 70–99)

## 2014-07-15 MED ORDER — ASPIRIN 81 MG PO TBEC: 81.0000 mg | DELAYED_RELEASE_TABLET | Freq: Every day | ORAL | Status: AC

## 2014-07-15 MED ORDER — SENNOSIDES-DOCUSATE SODIUM 8.6-50 MG PO TABS
1.0000 | ORAL_TABLET | Freq: Once | ORAL | Status: AC
  Administered 2014-07-15: 1 via ORAL
  Filled 2014-07-15 (×2): qty 1

## 2014-07-15 MED ORDER — ISOSORBIDE MONONITRATE ER 30 MG PO TB24: 30.0000 mg | ORAL_TABLET | Freq: Every day | ORAL | Status: AC

## 2014-07-15 MED ORDER — FLUTICASONE-SALMETEROL 250-50 MCG/DOSE IN AEPB: 1.0000 | INHALATION_SPRAY | Freq: Two times a day (BID) | RESPIRATORY_TRACT | Status: AC

## 2014-07-15 MED ORDER — SENNOSIDES-DOCUSATE SODIUM 8.6-50 MG PO TABS
1.0000 | ORAL_TABLET | Freq: Every day | ORAL | Status: DC
  Filled 2014-07-15: qty 1

## 2014-07-15 MED ORDER — TRAZODONE HCL 150 MG PO TABS: 150.0000 mg | ORAL_TABLET | Freq: Every day | ORAL | Status: AC

## 2014-07-15 MED ORDER — DOXYCYCLINE HYCLATE 100 MG PO TABS: 100.0000 mg | ORAL_TABLET | Freq: Two times a day (BID) | ORAL | Status: AC

## 2014-07-15 MED ORDER — LISINOPRIL 20 MG PO TABS: 20.0000 mg | ORAL_TABLET | Freq: Every day | ORAL | Status: AC

## 2014-07-15 MED ORDER — IBUPROFEN 800 MG PO TABS: 800.0000 mg | ORAL_TABLET | Freq: Three times a day (TID) | ORAL | Status: AC | PRN

## 2014-07-15 MED ORDER — OMEPRAZOLE 20 MG PO CPDR: 20.0000 mg | DELAYED_RELEASE_CAPSULE | Freq: Every day | ORAL | Status: AC

## 2014-07-15 MED ORDER — LORAZEPAM 0.5 MG PO TABS: 0.5000 mg | ORAL_TABLET | Freq: Four times a day (QID) | ORAL | Status: AC | PRN

## 2014-07-15 MED ORDER — GABAPENTIN 400 MG PO CAPS: 800.0000 mg | ORAL_CAPSULE | Freq: Three times a day (TID) | ORAL | Status: AC

## 2014-07-15 MED ORDER — NICOTINE 21 MG/24HR TD PT24: 21.0000 mg | MEDICATED_PATCH | Freq: Every day | TRANSDERMAL | Status: AC

## 2014-07-15 MED ORDER — SENNOSIDES-DOCUSATE SODIUM 8.6-50 MG PO TABS: 1.0000 | ORAL_TABLET | Freq: Every evening | ORAL | Status: AC | PRN

## 2014-07-15 MED ORDER — METFORMIN HCL 500 MG PO TABS: 500.0000 mg | ORAL_TABLET | Freq: Every day | ORAL | Status: AC

## 2014-07-15 MED ORDER — NITROGLYCERIN 0.4 MG SL SUBL: 0.4000 mg | SUBLINGUAL_TABLET | SUBLINGUAL | Status: AC | PRN

## 2014-07-15 MED ORDER — DULOXETINE HCL 40 MG PO CPEP: 40.0000 mg | ORAL_CAPSULE | Freq: Every day | ORAL | Status: AC

## 2014-07-15 MED ORDER — ROPINIROLE HCL 0.25 MG PO TABS: 0.2500 mg | ORAL_TABLET | Freq: Every day | ORAL | Status: AC

## 2014-07-15 MED ORDER — HYDROXYZINE HCL 10 MG PO TABS: 10.0000 mg | ORAL_TABLET | Freq: Four times a day (QID) | ORAL | Status: AC | PRN

## 2014-07-15 MED ORDER — PREGABALIN 300 MG PO CAPS: 300.0000 mg | ORAL_CAPSULE | ORAL | Status: AC | PRN

## 2014-07-15 MED ORDER — INSULIN GLARGINE 100 UNIT/ML ~~LOC~~ SOLN: 50.0000 [IU] | Freq: Every day | SUBCUTANEOUS | Status: AC

## 2014-07-15 MED ORDER — AMLODIPINE BESYLATE 5 MG PO TABS: 5.0000 mg | ORAL_TABLET | Freq: Every day | ORAL | Status: AC

## 2014-07-15 MED ORDER — HYDROCHLOROTHIAZIDE 25 MG PO TABS: 25.0000 mg | ORAL_TABLET | ORAL | Status: AC

## 2014-07-15 MED ORDER — ATORVASTATIN CALCIUM 80 MG PO TABS: 80.0000 mg | ORAL_TABLET | Freq: Every day | ORAL | Status: AC

## 2014-07-15 MED ORDER — LORATADINE 10 MG PO TABS: 10.0000 mg | ORAL_TABLET | Freq: Every day | ORAL | Status: AC

## 2014-07-15 NOTE — Plan of Care (Signed)
Problem: Ineffective individual coping Goal: STG: Patient will remain free from self harm Outcome: Progressing Pt safe on the unit Goal: STG:Pt. will utilize relaxation techniques to reduce stress STG: Patient will utilize relaxation techniques to reduce stress levels  Outcome: Not Progressing Pt resistant to techniques to help reduce stress

## 2014-07-15 NOTE — [facility] Group Notes (Signed)
[facility]   07/15/2014 2:06 PM  Participation Quality:  Invited. Chose not attend. "I have a head cold."   Hyatt,Aeric Burnham

## 2014-07-15 NOTE — Tx Team (Signed)
Interdisciplinary Treatment Plan Update (Adult)  Date: 07/15/2014 Time Reviewed: 1:57 PM  Progress in Treatment:  Attending groups: Yes Participating in groups: Yes Taking medication as prescribed: Yes  Tolerating medication: Yes  Family/Significant othe contact made:No, pt refused.  Patient understands diagnosis: Yes, AEB seeking help for SA.  Discussing patient identified problems/goals with staff: Yes  Medical problems stabilized or resolved: Yes  Denies suicidal/homicidal ideation: Yes  Patient has not harmed self or Others: Yes  New problem(s) identified: NA Discharge Plan or Barriers: [facility] with [facility]   Estimated length of stay: Pt will likely discharge today.    Attendees:  Patient:  07/15/2014 1:57 PM   Family:  4/22/20161:57 PM   Physician: Dr. Elna BreslowEappen MD  4/22/20161:57 PM  Nursing: Daiva HugePatty,RN Christa, RN  07/15/2014 1:57 PM  Clinical Social Worker: Daryel Geraldodney [place], [place]  4/22/20161:57 PM  Clinical Social Worker: Charleston Ropesandace Hyatt, CSW Intern 4/22/20161:57 PM  Other: Mercy RidingValerie, [facility]  4/22/20161:57 PM  Other: Ruta Hindselora, P4CC 4/22/20161:57 PM  Other:  4/22/20161:57 PM  Scribe for Treatment Team:  Charleston Ropesandace Hyatt, CSW Intern 07/15/2014 1:57 PM

## 2014-07-15 NOTE — Discharge Summary (Signed)
Physician Discharge Summary Note  Patient:  Shirley Short is an 54 y.o., female MRN:  [id number] DOB:  [birth year] Patient phone:  [phone] (home)  Patient address:   [address] [place] [address],  Total Time spent with patient: 30 minutes  Date of Admission:  07/12/2014 Date of Discharge: 07/15/14  Reason for Admission:  Mood stabilization treatments  Principal Problem: Major depressive disorder, recurrent, severe without psychotic features Discharge Diagnoses: Patient Active Problem List   Diagnosis Date Noted  . Cellulitis of other specified site [L03.818]   . Atypical chest pain [R07.89]   . Cellulitis and abscess of hand [L03.119, L02.519] 07/13/2014  . Major depressive disorder, recurrent, severe without psychotic features [F33.2]   . Cocaine abuse with cocaine-induced mood disorder [F14.14]   . Psychoactive substance-induced mood disorder [F19.94, F06.30] 07/12/2014  . Substance induced mood disorder [F19.94] 07/12/2014  . Chest pain [R07.9] 07/07/2014  . Chronic pain disorder [G89.4] 05/31/2014  . MDD (major depressive disorder) [F32.2] 05/31/2014  . Abscess of right hand [L02.511]   . CAD (coronary artery disease) [I25.10]   . MDD (major depressive disorder), recurrent, severe, with psychosis [F33.3] 05/29/2014  . Cocaine abuse [F14.10] 05/23/2014  . Obesity (BMI 30-39.9) [E66.9] 05/23/2014  . COPD (chronic obstructive pulmonary disease) [J44.9] 05/23/2014  . Hyperlipidemia [E78.5] 05/23/2014  . DM2 (diabetes mellitus, type 2) [E11.9]   . Noncompliance [Z91.19]   . Tobacco abuse [Z72.0]   . Hypertension [I10]     Musculoskeletal: Strength & Muscle Tone: within normal limits Gait & Station: normal Patient leans: N/A  Psychiatric Specialty Exam: Physical Exam  Psychiatric: She has a normal mood and affect. Her speech is normal and behavior is normal. Judgment and thought content normal. Cognition and memory are normal.    Review of Systems   Constitutional: Negative.   HENT: Negative.   Eyes: Negative.   Respiratory: Negative.   Cardiovascular: Negative.   Gastrointestinal: Negative.   Genitourinary: Negative.   Musculoskeletal: Negative.   Skin: Negative.   Neurological: Negative.   Endo/Heme/Allergies: Negative.   Psychiatric/Behavioral: Positive for depression (Stabilized with treatments). Negative for suicidal ideas, hallucinations, memory loss and substance abuse. The patient is not nervous/anxious and does not have insomnia.     Blood pressure 131/69, pulse 79, temperature 97.9 F (36.6 C), temperature source Oral, resp. rate 16, height 5' 4"  (1.626 m), weight 88.451 kg (195 lb), SpO2 100 %.Body mass index is 33.46 kg/(m^2).  See Physician SRA     Past Medical History:  Past Medical History  Diagnosis Date  . CAD (coronary artery disease)     a. s/p LHC on 05/2014 admission with non obst dz. Rx medically.   . Insulin dependent diabetes mellitus   . Noncompliance   . Obesity (BMI 30-39.9)   . Hyperlipidemia   . Hypertension   . Cocaine abuse   . Major depressive disorder   . Suicidal ideation   . Abscess of right hand     a. s/p ID on 05/2014 admission     Past Surgical History  Procedure Laterality Date  . Appendectomy    . Abdominal hysterectomy    . Cholecystectomy    . Left heart catheterization with coronary angiogram N/A 05/23/2014    Procedure: LEFT HEART CATHETERIZATION WITH CORONARY ANGIOGRAM;  Surgeon: Lorretta Harp, MD;  Location: [facility] CATH LAB;  Service: Cardiovascular;  Laterality: N/A;  . I&d extremity Right 05/25/2014    Procedure: IRRIGATION AND DEBRIDEMENT EXTREMITY;  Surgeon:  Charlotte Crumb, MD;  Location: [facility];  Service: Orthopedics;  Laterality: Right;   Family History:  Family History  Problem Relation Age of Onset  . Heart attack Mother    Social History:  History  Alcohol Use No     History  Drug Use  . Yes  . Special: Cocaine    Comment: Cocaine    History    Social History  . Marital Status: Divorced    Spouse Name: N/A  . Number of Children: N/A  . Years of Education: N/A   Social History Main Topics  . Smoking status: Current Every Day Smoker -- 2.00 packs/day for 30 years    Types: Cigarettes  . Smokeless tobacco: Never Used  . Alcohol Use: No  . Drug Use: Yes    Special: Cocaine     Comment: Cocaine  . Sexual Activity: Not on file   Other Topics Concern  . None   Social History Narrative    Risk to Self: Is patient at risk for suicide?: No Risk to Others:   Prior Inpatient Therapy:   Prior Outpatient Therapy:    Level of Care:  OP  Hospital Course:  Shirley Short is a 54 year old female who stated on admission that she went to the hospital with complaints of chest pain. States that she informed the nurse that she was depressed with suicidal thoughts. States that she had a aunt who died 1 week ago, sister to pass 1 month ago, and a friend in jail now for murder. Another stressor is that patient was receiving home health services and was recently told that she would not be receiving anymore and she feels that she stills needs the services. 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 Patient has a history of suicide attempt via overdose. Patient also has a history of illicit drug abuse (Polysubstance and IV use). Patient continues to endorse passive suicidal thoughts without a plan; but is able to contract for safety at this time. Patient denies homicidal ideation, psychosis, and paranoia. Patient has a history of using Cymbalta that she states worked well for her.           Shirley Short was admitted to the adult unit where she was evaluated and her symptoms were identified. Medication management was discussed and implemented. She was encouraged to participate in the unit programming. Medical problems were identified and treated  appropriately. Home medication was restarted as needed. She was evaluated each day by a clinical provider to ascertain the her response to treatment.  Improvement was noted by the patient's report of decreasing symptoms, improved sleep and appetite, affect, medication tolerance, behavior, and participation in unit programming.  The patient was asked each day to complete a self inventory noting mood, mental status, pain, new symptoms, anxiety and or other concerns.  Leauna's symptoms responded well to her medication regimen and being in a therapeutic and supportive environment also assisted in her mood stability. Positive and appropriate behavior was noted and she was motivated for recovery. Ronny worked closely with the treatment team and case manager to develop a discharge plan with appropriate goals to maintain mood stability after discharge. Coping skills, problem solving as well as relaxation therapies were also part of her unit programming.  On this day of her hospital discharge,  Tandrea was in much improved condition than upon admission. Her symptoms were reported as  significantly decreased or resolved completely. She denies any SI/HI and voiced no AVH. She was motivated to continue taking medication with a goal of continued improvement in mental health. KATELEN LUEPKE was discharged home with a plan to follow up as noted below. She was provided with a 14 days worth, supply samples of her [facility] discharge medications. She left [facility] with all belongings in no distress. Transportation per city bus. [place] assisted with bus pass.  Consults:  psychiatry  Significant Diagnostic Studies: Chemistry panel, CBC,  UDS negative   Discharge Vitals:   Blood pressure 131/69, pulse 79, temperature 97.9 F (36.6 C), temperature source Oral, resp. rate 16, height 5' 4"  (1.626 m), weight 88.451 kg (195 lb), SpO2 100 %. Body mass index is 33.46 kg/(m^2). Lab Results:   Results for orders placed or performed during the  hospital encounter of 07/12/14 (from the past 72 hour(s))  Glucose, capillary     Status: Abnormal   Collection Time: 07/12/14 10:44 PM  Result Value Ref Range   Glucose-Capillary 295 (H) 70 - 99 mg/dL  Glucose, capillary     Status: Abnormal   Collection Time: 07/13/14  6:12 AM  Result Value Ref Range   Glucose-Capillary 196 (H) 70 - 99 mg/dL  Glucose, capillary     Status: Abnormal   Collection Time: 07/13/14 11:15 AM  Result Value Ref Range   Glucose-Capillary 169 (H) 70 - 99 mg/dL   Comment 1 Notify RN    Comment 2 Document in Chart   Troponin I     Status: None   Collection Time: 07/13/14 11:45 AM  Result Value Ref Range   Troponin I <0.03 <0.031 ng/mL    Comment:        NO INDICATION OF MYOCARDIAL INJURY. Performed at [facility]   CBC with Differential/Platelet     Status: None   Collection Time: 07/13/14 11:45 AM  Result Value Ref Range   WBC 7.8 4.0 - 10.5 K/uL   RBC 4.54 3.87 - 5.11 MIL/uL   Hemoglobin 12.6 12.0 - 15.0 g/dL   HCT 38.5 36.0 - 46.0 %   MCV 84.8 78.0 - 100.0 fL   MCH 27.8 26.0 - 34.0 pg   MCHC 32.7 30.0 - 36.0 g/dL   RDW 13.6 11.5 - 15.5 %   Platelets 158 150 - 400 K/uL   Neutrophils Relative % 67 43 - 77 %   Neutro Abs 5.2 1.7 - 7.7 K/uL   Lymphocytes Relative 26 12 - 46 %   Lymphs Abs 2.0 0.7 - 4.0 K/uL   Monocytes Relative 6 3 - 12 %   Monocytes Absolute 0.5 0.1 - 1.0 K/uL   Eosinophils Relative 1 0 - 5 %   Eosinophils Absolute 0.1 0.0 - 0.7 K/uL   Basophils Relative 0 0 - 1 %   Basophils Absolute 0.0 0.0 - 0.1 K/uL    Comment: Performed at [facility]  Glucose, capillary     Status: Abnormal   Collection Time: 07/13/14  5:11 PM  Result Value Ref Range   Glucose-Capillary 311 (H) 70 - 99 mg/dL   Comment 1 Notify RN    Comment 2 Document in Chart   Hepatic function panel     Status: Abnormal   Collection Time: 07/13/14 10:40 PM  Result Value Ref Range   Total Protein 7.2 6.0 - 8.3 g/dL   Albumin  3.4 (L) 3.5 - 5.2 g/dL   AST 40 (H) 0 -  37 U/L   ALT 38 (H) 0 - 35 U/L   Alkaline Phosphatase 89 39 - 117 U/L   Total Bilirubin 0.2 (L) 0.3 - 1.2 mg/dL   Bilirubin, Direct 0.2 0.0 - 0.5 mg/dL   Indirect Bilirubin 0.0 (L) 0.3 - 0.9 mg/dL  Lipase, blood     Status: None   Collection Time: 07/13/14 10:40 PM  Result Value Ref Range   Lipase 22 11 - 59 U/L  CBC     Status: Abnormal   Collection Time: 07/13/14 10:44 PM  Result Value Ref Range   WBC 7.4 4.0 - 10.5 K/uL   RBC 4.91 3.87 - 5.11 MIL/uL   Hemoglobin 13.8 12.0 - 15.0 g/dL   HCT 40.9 36.0 - 46.0 %   MCV 83.3 78.0 - 100.0 fL   MCH 28.1 26.0 - 34.0 pg   MCHC 33.7 30.0 - 36.0 g/dL   RDW 13.9 11.5 - 15.5 %   Platelets 146 (L) 150 - 400 K/uL  Basic metabolic panel     Status: Abnormal   Collection Time: 07/13/14 10:44 PM  Result Value Ref Range   Sodium 135 135 - 145 mmol/L   Potassium 4.4 3.5 - 5.1 mmol/L   Chloride 98 96 - 112 mmol/L   CO2 27 19 - 32 mmol/L   Glucose, Bld 167 (H) 70 - 99 mg/dL   BUN 17 6 - 23 mg/dL   Creatinine, Ser 0.90 0.50 - 1.10 mg/dL   Calcium 10.1 8.4 - 10.5 mg/dL   GFR calc non Af Amer 71 (L) >90 mL/min   GFR calc Af Amer 83 (L) >90 mL/min    Comment: (NOTE) The eGFR has been calculated using the CKD EPI equation. This calculation has not been validated in all clinical situations. eGFR's persistently <90 mL/min signify possible Chronic Kidney Disease.    Anion gap 10 5 - 15  I-stat troponin, ED (not at [facility])     Status: None   Collection Time: 07/13/14 10:48 PM  Result Value Ref Range   Troponin i, poc 0.00 0.00 - 0.08 ng/mL   Comment 3            Comment: Due to the release kinetics of cTnI, a negative result within the first hours of the onset of symptoms does not rule out myocardial infarction with certainty. If myocardial infarction is still suspected, repeat the test at appropriate intervals.   CBG monitoring, ED     Status: Abnormal   Collection Time: 07/13/14 11:52 PM  Result  Value Ref Range   Glucose-Capillary 160 (H) 70 - 99 mg/dL  Urinalysis, Routine w reflex microscopic     Status: Abnormal   Collection Time: 07/14/14  3:27 AM  Result Value Ref Range   Color, Urine YELLOW YELLOW   APPearance CLOUDY (A) CLEAR   Specific Gravity, Urine 1.005 1.005 - 1.030   pH 7.0 5.0 - 8.0   Glucose, UA NEGATIVE NEGATIVE mg/dL   Hgb urine dipstick NEGATIVE NEGATIVE   Bilirubin Urine NEGATIVE NEGATIVE   Ketones, ur NEGATIVE NEGATIVE mg/dL   Protein, ur NEGATIVE NEGATIVE mg/dL   Urobilinogen, UA 0.2 0.0 - 1.0 mg/dL   Nitrite NEGATIVE NEGATIVE   Leukocytes, UA NEGATIVE NEGATIVE    Comment: MICROSCOPIC NOT DONE ON URINES WITH NEGATIVE PROTEIN, BLOOD, LEUKOCYTES, NITRITE, OR GLUCOSE <1000 mg/dL.  Drug screen panel, emergency     Status: None   Collection Time: 07/14/14  3:28 AM  Result Value Ref Range   Opiates  NONE DETECTED NONE DETECTED   Cocaine NONE DETECTED NONE DETECTED   Benzodiazepines NONE DETECTED NONE DETECTED   Amphetamines NONE DETECTED NONE DETECTED   Tetrahydrocannabinol NONE DETECTED NONE DETECTED   Barbiturates NONE DETECTED NONE DETECTED    Comment:        DRUG SCREEN FOR MEDICAL PURPOSES ONLY.  IF CONFIRMATION IS NEEDED FOR ANY PURPOSE, NOTIFY LAB WITHIN 5 DAYS.        LOWEST DETECTABLE LIMITS FOR URINE DRUG SCREEN Drug Class       Cutoff (ng/mL) Amphetamine      1000 Barbiturate      200 Benzodiazepine   712 Tricyclics       458 Opiates          300 Cocaine          300 THC              50   Glucose, capillary     Status: Abnormal   Collection Time: 07/14/14  6:28 AM  Result Value Ref Range   Glucose-Capillary 184 (H) 70 - 99 mg/dL  Glucose, capillary     Status: Abnormal   Collection Time: 07/14/14 11:29 AM  Result Value Ref Range   Glucose-Capillary 178 (H) 70 - 99 mg/dL   Comment 1 Notify RN    Comment 2 Document in Chart   Glucose, capillary     Status: Abnormal   Collection Time: 07/14/14  4:30 PM  Result Value Ref Range    Glucose-Capillary 164 (H) 70 - 99 mg/dL   Comment 1 Notify RN    Comment 2 Document in Chart   Glucose, capillary     Status: Abnormal   Collection Time: 07/14/14  8:03 PM  Result Value Ref Range   Glucose-Capillary 144 (H) 70 - 99 mg/dL  Glucose, capillary     Status: Abnormal   Collection Time: 07/15/14  6:30 AM  Result Value Ref Range   Glucose-Capillary 142 (H) 70 - 99 mg/dL  Glucose, capillary     Status: Abnormal   Collection Time: 07/15/14 11:39 AM  Result Value Ref Range   Glucose-Capillary 191 (H) 70 - 99 mg/dL   Comment 1 Notify RN    Comment 2 Document in Chart     Physical Findings: AIMS:  , ,  ,  ,    CIWA:    COWS:     See Psychiatric Specialty Exam and Suicide Risk Assessment completed by Attending Physician prior to discharge.  Discharge destination:  Home  Is patient on multiple antipsychotic therapies at discharge:  No   Has Patient had three or more failed trials of antipsychotic monotherapy by history:  No  Recommended Plan for Multiple Antipsychotic Therapies: NA    Medication List    STOP taking these medications        amitriptyline 100 MG tablet  Commonly known as:  ELAVIL     glucose blood test strip     HYDROcodone-acetaminophen 5-325 MG per tablet  Commonly known as:  NORCO/VICODIN     insulin detemir 100 UNIT/ML injection  Commonly known as:  LEVEMIR     insulin regular 100 units/mL injection  Commonly known as:  NOVOLIN R,HUMULIN R     promethazine 25 MG tablet  Commonly known as:  PHENERGAN      TAKE these medications      Indication   amLODipine 5 MG tablet  Commonly known as:  NORVASC  Take 1 tablet (5 mg total) by  mouth daily. For high blood pressure   Indication:  High Blood Pressure     aspirin 81 MG EC tablet  Take 1 tablet (81 mg total) by mouth daily. For heart health   Indication:  Heart health     atorvastatin 80 MG tablet  Commonly known as:  LIPITOR  Take 1 tablet (80 mg total) by mouth daily at 6 PM. For  high cholesterol   Indication:  Elevation of Both Cholesterol and Triglycerides in Blood     doxycycline 100 MG tablet  Commonly known as:  VIBRA-TABS  Take 1 tablet (100 mg total) by mouth every 12 (twelve) hours. For infection   Indication:  Infection     DULoxetine HCl 40 MG Cpep  Take 40 mg by mouth daily. For depression   Indication:  Major Depressive Disorder     Fluticasone-Salmeterol 250-50 MCG/DOSE Aepb  Commonly known as:  ADVAIR  Inhale 1 puff into the lungs every 12 (twelve) hours. For asthma   Indication:  Asthma     gabapentin 400 MG capsule  Commonly known as:  NEURONTIN  Take 2 capsules (800 mg total) by mouth 3 (three) times daily. For agitation   Indication:  Agitation     hydrochlorothiazide 25 MG tablet  Commonly known as:  HYDRODIURIL  Take 1 tablet (25 mg total) by mouth every morning. High blood pressure   Indication:  High Blood Pressure     hydrOXYzine 10 MG tablet  Commonly known as:  ATARAX/VISTARIL  Take 1 tablet (10 mg total) by mouth every 6 (six) hours as needed for anxiety.   Indication:  Anxiety     ibuprofen 800 MG tablet  Commonly known as:  ADVIL,MOTRIN  Take 1 tablet (800 mg total) by mouth 3 (three) times daily as needed for mild pain.   Indication:  mild to moderate pain     insulin glargine 100 UNIT/ML injection  Commonly known as:  LANTUS  Inject 0.5 mLs (50 Units total) into the skin daily. For diabetes managment   Indication:  Type 2 Diabetes     isosorbide mononitrate 30 MG 24 hr tablet  Commonly known as:  IMDUR  Take 1 tablet (30 mg total) by mouth daily. For heart condition   Indication:  Chronic Angina Pectoris     lisinopril 20 MG tablet  Commonly known as:  PRINIVIL,ZESTRIL  Take 1 tablet (20 mg total) by mouth daily. For high blood pressure   Indication:  Severe Rapidly Progressing Hypertension     loratadine 10 MG tablet  Commonly known as:  CLARITIN  Take 1 tablet (10 mg total) by mouth daily. For allergies    Indication:  Hayfever     LORazepam 0.5 MG tablet  Commonly known as:  ATIVAN  Take 1 tablet (0.5 mg total) by mouth every 6 (six) hours as needed for anxiety.   Indication:  Feeling Anxious, Anxiety     metFORMIN 500 MG tablet  Commonly known as:  GLUCOPHAGE  Take 1 tablet (500 mg total) by mouth daily. For diabetes managment   Indication:  Type 2 Diabetes     nicotine 21 mg/24hr patch  Commonly known as:  NICODERM CQ - dosed in mg/24 hours  Place 1 patch (21 mg total) onto the skin daily. For nicotine addiction   Indication:  Nicotine Addiction     nitroGLYCERIN 0.4 MG SL tablet  Commonly known as:  NITROSTAT  Place 1 tablet (0.4 mg total) under the tongue every 5 (  five) minutes x 3 doses as needed for chest pain.   Indication:  Acute Angina Pectoris     omeprazole 20 MG capsule  Commonly known as:  PRILOSEC  Take 1 capsule (20 mg total) by mouth daily. For acid reflux   Indication:  Gastroesophageal Reflux Disease     pregabalin 300 MG capsule  Commonly known as:  LYRICA  Take 1 capsule (300 mg total) by mouth every 4 (four) hours as needed (For pain).   Indication:  Neuropathic Pain     rOPINIRole 0.25 MG tablet  Commonly known as:  REQUIP  Take 1 tablet (0.25 mg total) by mouth at bedtime. For restless leg   Indication:  Restless Leg Syndrome     senna-docusate 8.6-50 MG per tablet  Commonly known as:  Senokot-S  Take 1 tablet by mouth at bedtime as needed for mild constipation.   Indication:  Constipation     traZODone 150 MG tablet  Commonly known as:  DESYREL  Take 1 tablet (150 mg total) by mouth at bedtime. For sleep   Indication:  Trouble Sleeping           Follow-up Information    Follow up with [facility]    .   Contact information:   [address] [place] [address] [phone]      Follow up with [facility] . Go on 07/18/2014.   Why:  You will need to go to the walk in clinic for your first  appointment. Monday-Friday 8:00am -3:00pm. Take all hospital paperwork.    Contact information:   [address], [place] [address] Phone: [phone] Fax: [phone]     Follow-up recommendations: Activity: as tolerated Diet: Low sugar/ADA diet, and heart healthy Tests: NA Other: See below  Comments:   Take all your medications as prescribed by your mental healthcare provider.  Report any adverse effects and or reactions from your medicines to your outpatient provider promptly.  Patient is instructed and cautioned to not engage in alcohol and or illegal drug use while on prescription medicines.  In the event of worsening symptoms, patient is instructed to call the crisis hotline, 911 and or go to the nearest ED for appropriate evaluation and treatment of symptoms.  Follow-up with your primary care provider for your other medical issues, concerns and or health care needs.   Total Discharge Time: Greater than 30 minutes  Signed: Encarnacion Slates, PMHNP-BC  07/15/2014, 3:36 PM   Patient seen, Suicide Assessment Completed.  Disposition Plan Reviewed

## 2014-07-15 NOTE — Progress Notes (Signed)
  [facility] :  Will you be returning to the same living situation after discharge:  No. At discharge, do you have transportation home?: Yes,  Bus Do you have the ability to pay for your medications: Yes,  Insurance, income   Release of information consent forms completed and in the chart;  Patient's signature needed at discharge.  Patient to Follow up at: Follow-up Information    Follow up with [facility]    .   Contact information:   [address] [place] [phone] [phone]      Follow up with [facility] . Go on 07/18/2014.   Why:  You will need to go to the walk in clinic for your first appointment. Monday-Friday 8:00am -3:00pm. Take all hospital paperwork.    Contact information:   [address] [place] [address] Phone: [phone] Fax: [phone]      Patient denies SI/HI: Yes,  Yes    Safety Planning and Suicide Prevention discussed: Yes,  With patient   Have you used any form of tobacco in the last 30 days? (Cigarettes, Smokeless Tobacco, Cigars, and/or Pipes): Yes  Has patient been referred to the Quitline?: Patient refused referral  Shirley Short,Shirley Short 07/15/2014, 3:01 PM

## 2014-07-15 NOTE — [facility] Suicide Risk Assessment (Signed)
[facility]   Demographic Factors:  54 year old female , divorced, was living with a roommate, on disability  Total Time spent with patient: 30 minutes  Musculoskeletal: Strength & Muscle Tone: within normal limits Gait & Station: normal Patient leans: N/A  Psychiatric Specialty Exam: Physical Exam  ROS  Blood pressure 131/69, pulse 79, temperature 97.9 F (36.6 C), temperature source Oral, resp. rate 16, height 5\' 4"  (1.626 m), weight 195 lb (88.451 kg), SpO2 100 %.Body mass index is 33.46 kg/(m^2).  General Appearance: grooming is improving gradually  Eye Contact:: Good  Speech: Normal Rate  Volume: Normal  Mood: Remains depressed, dysphoric but somewhat better   Affect: less constricted, less irritable   Thought Process: Linear  Orientation: Other: fully alert and attentive  Thought Content: no hallucinations, no delusions  Suicidal Thoughts: No at present denies any thoughts of hurting self or anyone else   Homicidal Thoughts: No  Memory: Recent and remote grossly intact   Judgement: Fair  Insight: Fair  Psychomotor Activity: Decreased  Concentration: Good  Recall: Good  Fund of Knowledge:Good  Language: Good  Akathisia: Negative  Handed: Right  AIMS (if indicated):    Assets: Desire for Improvement Resilience  ADL's: Impaired  Cognition: WNL  Sleep:                                                              Have you used any form of tobacco in the last 30 days? (Cigarettes, Smokeless Tobacco, Cigars, and/or Pipes): Yes  Has this patient used any form of tobacco in the last 30 days? (Cigarettes, Smokeless Tobacco, Cigars, and/or Pipes)  Encouraged to stop smoking- agrees to prescription for nicoderm patches   Mental Status Per Nursing Assessment::   On Admission:  Suicidal ideation indicated by patient, Self-harm thoughts  Current Mental Status by  Physician: At this time patient is improved, less depressed, less irritable, mood improved, affect improved and less dysphoric, less irritable, describes mood as "OK", affect appropriate, no thought disorder, denies SI or HI, no psychotic symptoms. States she is hopeful and optimistic about her future at this time.  Loss Factors: Recent cocaine use, medical issues, problems with boyfriend, who also was using cocaine, friend incarcerated   Historical Factors: Long history of depression and cocaine dependence   Risk Reduction Factors:   Sense of responsibility to family and Positive coping skills or problem solving skills  Continued Clinical Symptoms:  At this time patient is improved, she is presenting with improving mood and affect is better, no thought disorder, no SI or HI, no psychotic symptoms, future oriented. She is tolerating medications well at present . Denies side effects.  Cognitive Features That Contribute To Risk:  No gross cognitive deficits noted upon discharge. Is alert , attentive, and oriented x 3   Suicide Risk:  Mild:  Suicidal ideation of limited frequency, intensity, duration, and specificity.  There are no identifiable plans, no associated intent, mild dysphoria and related symptoms, good self-control (both objective and subjective assessment), few other risk factors, and identifiable protective factors, including available and accessible social support.  Principal Problem: Major depressive disorder, recurrent, severe without psychotic features Discharge Diagnoses:  Patient Active Problem List   Diagnosis Date Noted  . Cellulitis of other specified site [  L03.818]   . Atypical chest pain [R07.89]   . Cellulitis and abscess of hand [L03.119, L02.519] 07/13/2014  . Major depressive disorder, recurrent, severe without psychotic features [F33.2]   . Cocaine abuse with cocaine-induced mood disorder [F14.14]   . Psychoactive substance-induced mood disorder [F19.94,  F06.30] 07/12/2014  . Substance induced mood disorder [F19.94] 07/12/2014  . Chest pain [R07.9] 07/07/2014  . Chronic pain disorder [G89.4] 05/31/2014  . MDD (major depressive disorder) [F32.2] 05/31/2014  . Abscess of right hand [L02.511]   . CAD (coronary artery disease) [I25.10]   . MDD (major depressive disorder), recurrent, severe, with psychosis [F33.3] 05/29/2014  . Cocaine abuse [F14.10] 05/23/2014  . Obesity (BMI 30-39.9) [E66.9] 05/23/2014  . COPD (chronic obstructive pulmonary disease) [J44.9] 05/23/2014  . Hyperlipidemia [E78.5] 05/23/2014  . DM2 (diabetes mellitus, type 2) [E11.9]   . Noncompliance [Z91.19]   . Tobacco abuse [Z72.0]   . Hypertension [I10]     Follow-up Information    Follow up with [facility]    .   Contact information:   [address] [place] [address] [phone]      Follow up with [facility] . Go on 07/18/2014.   Why:  You will need to go to the walk in clinic for your first appointment. Monday-Friday 8:00am -3:00pm. Take all hospital paperwork.    Contact information:   [address] [place], [place] [address] Phone: [phone] Fax: [phone]      Plan Of Care/Follow-up recommendations:  Activity:  as tolerated Diet:  Low sugar/ADA diet, and heart healthy Tests:  NA Other:  See below  Is patient on multiple antipsychotic therapies at discharge:  No   Has Patient had three or more failed trials of antipsychotic monotherapy by history:  No  Recommended Plan for Multiple Antipsychotic Therapies: NA   Patient is requesting discharge and put in a letter requesting discharge yesterday. There are currently no grounds for any ongoing involuntary commitment. Plans to follow up as above - encouraged to follow up with PCP, and to complete her antibiotic course . Plans to go to [facility] in [facility]. Plans to go to NA meetings.     Javohn Basey 07/15/2014, 3:11 PM

## 2014-07-15 NOTE — Progress Notes (Signed)
Patient ID: Shirley Short, female   DOB: Jun 06, 1960, 54 y.o.   MRN: [id number] [facility] MD Progress Note  07/15/2014 2:20 PM Shirley Short  MRN:  [id number] Subjective:   Patient  Continues to report some physical symptoms such as hand pain, constipation. However, she is acknowledging that she is starting to feel better. She states that she has  A higher motivation now to work on relapse prevention and substance abuse treatment and spoke about her desire to go to an inpatient Rehab setting upon discharge .  Objective : I have discussed case with treatment team and have met with patient . As discussed with staff has remained depressed, irritable at times, somatically focused, but improving gradually compared to admission. Today she is more focused on disposition planning and is , as above, wanting to go to an inpatient Rehab after discharge. States she is motivated in sobriety. Group /milieu participation is irregular. She has gone to some meetings and is less isolative . Denies medication side effects. As noted, her hand .which was initially quite edematous/inflammed /erythematous, is now better, less inflamed. Appreciate Hospitalist Follow Up.  Principal Problem: Major depressive disorder, recurrent, severe without psychotic features Diagnosis:   Patient Active Problem List   Diagnosis Date Noted  . Cellulitis of other specified site [L03.818]   . Atypical chest pain [R07.89]   . Cellulitis and abscess of hand [L03.119, L02.519] 07/13/2014  . Major depressive disorder, recurrent, severe without psychotic features [F33.2]   . Cocaine abuse with cocaine-induced mood disorder [F14.14]   . Psychoactive substance-induced mood disorder [F19.94, F06.30] 07/12/2014  . Substance induced mood disorder [F19.94] 07/12/2014  . Chest pain [R07.9] 07/07/2014  . Chronic pain disorder [G89.4] 05/31/2014  . MDD (major depressive disorder) [F32.2] 05/31/2014  . Abscess of right hand [L02.511]   . CAD  (coronary artery disease) [I25.10]   . MDD (major depressive disorder), recurrent, severe, with psychosis [F33.3] 05/29/2014  . Cocaine abuse [F14.10] 05/23/2014  . Obesity (BMI 30-39.9) [E66.9] 05/23/2014  . COPD (chronic obstructive pulmonary disease) [J44.9] 05/23/2014  . Hyperlipidemia [E78.5] 05/23/2014  . DM2 (diabetes mellitus, type 2) [E11.9]   . Noncompliance [Z91.19]   . Tobacco abuse [Z72.0]   . Hypertension [I10]    Total Time spent with patient: 20 minutes   Past Medical History:  Past Medical History  Diagnosis Date  . CAD (coronary artery disease)     a. s/p LHC on 05/2014 admission with non obst dz. Rx medically.   . Insulin dependent diabetes mellitus   . Noncompliance   . Obesity (BMI 30-39.9)   . Hyperlipidemia   . Hypertension   . Cocaine abuse   . Major depressive disorder   . Suicidal ideation   . Abscess of right hand     a. s/p ID on 05/2014 admission     Past Surgical History  Procedure Laterality Date  . Appendectomy    . Abdominal hysterectomy    . Cholecystectomy    . Left heart catheterization with coronary angiogram N/A 05/23/2014    Procedure: LEFT HEART CATHETERIZATION WITH CORONARY ANGIOGRAM;  Surgeon: Lorretta Harp, MD;  Location: [facility] CATH LAB;  Service: Cardiovascular;  Laterality: N/A;  . I&d extremity Right 05/25/2014    Procedure: IRRIGATION AND DEBRIDEMENT EXTREMITY;  Surgeon: Charlotte Crumb, MD;  Location: [place];  Service: Orthopedics;  Laterality: Right;   Family History:  Family History  Problem Relation Age of Onset  . Heart attack Mother    Social History:  History  Alcohol Use No     History  Drug Use  . Yes  . Special: Cocaine    Comment: Cocaine    History   Social History  . Marital Status: Divorced    Spouse Name: N/A  . Number of Children: N/A  . Years of Education: N/A   Social History Main Topics  . Smoking status: Current Every Day Smoker -- 2.00 packs/day for 30 years    Types: Cigarettes  .  Smokeless tobacco: Never Used  . Alcohol Use: No  . Drug Use: Yes    Special: Cocaine     Comment: Cocaine  . Sexual Activity: Not on file   Other Topics Concern  . None   Social History Narrative   Additional History:    Sleep: Fair  Appetite:  Fair   Assessment:   Musculoskeletal: Strength & Muscle Tone: within normal limits Gait & Station: normal Patient leans: N/A   Psychiatric Specialty Exam: Physical Exam  Review of Systems  Constitutional: Negative for fever and chills.  Eyes: Negative.   Respiratory: Negative for cough and shortness of breath.   Cardiovascular: Negative.  Negative for chest pain.       No chest pain today  Gastrointestinal: Positive for nausea. Negative for vomiting.  Genitourinary: Negative for dysuria, urgency and frequency.  Skin: Negative.  Negative for rash.       Hand cellulitis improved   Neurological: Negative for seizures and headaches.  Endo/Heme/Allergies: Negative.   Psychiatric/Behavioral: Positive for depression.    Blood pressure 131/69, pulse 79, temperature 97.9 F (36.6 C), temperature source Oral, resp. rate 16, height 5' 4"  (1.626 m), weight 195 lb (88.451 kg), SpO2 100 %.Body mass index is 33.46 kg/(m^2).  General Appearance: grooming is improving gradually  Eye Contact::  Good  Speech:  Normal Rate  Volume:  Normal  Mood:   Remains depressed, dysphoric but somewhat better   Affect:  less constricted, less irritable   Thought Process:  Linear  Orientation:  Other:  fully alert and attentive  Thought Content:  no hallucinations, no delusions  Suicidal Thoughts:  No at present denies any thoughts of hurting self or anyone else   Homicidal Thoughts:  No  Memory:   Recent and remote grossly intact   Judgement:  Fair  Insight:  Fair  Psychomotor Activity:  Decreased  Concentration:  Good  Recall:  Good  Fund of Knowledge:Good  Language: Good  Akathisia:  Negative  Handed:  Right  AIMS (if indicated):      Assets:  Desire for Improvement Resilience  ADL's:  Impaired  Cognition: WNL  Sleep:        Current Medications: Current Facility-Administered Medications  Medication Dose Route Frequency Provider Last Rate Last Dose  . acetaminophen (TYLENOL) tablet 650 mg  650 mg Oral Q6H PRN Laverle Hobby, PA-C   650 mg at 07/13/14 1718  . alum & mag hydroxide-simeth (MAALOX/MYLANTA) 200-200-20 MG/5ML suspension 30 mL  30 mL Oral Q4H PRN Laverle Hobby, PA-C      . amLODipine (NORVASC) tablet 5 mg  5 mg Oral Daily Laverle Hobby, PA-C   5 mg at 07/15/14 9381  . aspirin EC tablet 81 mg  81 mg Oral Daily Laverle Hobby, PA-C   81 mg at 07/15/14 8299  . atorvastatin (LIPITOR) tablet 80 mg  80 mg Oral q1800 Laverle Hobby, PA-C   80 mg at 07/14/14 1705  . doxycycline (VIBRA-TABS) tablet 100 mg  100 mg Oral Q12H Venetia Maxon Rama, MD   100 mg at 07/15/14 0926  . DULoxetine (CYMBALTA) DR capsule 40 mg  40 mg Oral Daily Laverle Hobby, PA-C   40 mg at 07/15/14 0920  . gabapentin (NEURONTIN) capsule 800 mg  800 mg Oral TID Laverle Hobby, PA-C   800 mg at 07/15/14 1211  . hydrochlorothiazide (HYDRODIURIL) tablet 25 mg  25 mg Oral BH-q7a Spencer E Simon, PA-C   25 mg at 07/15/14 0646  . hydrOXYzine (ATARAX/VISTARIL) tablet 10 mg  10 mg Oral Q6H PRN Jenne Campus, MD   10 mg at 07/13/14 1326  . ibuprofen (ADVIL,MOTRIN) tablet 800 mg  800 mg Oral TID PRN Laverle Hobby, PA-C   800 mg at 07/13/14 1353  . insulin aspart (novoLOG) injection 0-20 Units  0-20 Units Subcutaneous TID WC Laverle Hobby, PA-C   4 Units at 07/15/14 1213  . insulin aspart (novoLOG) injection 0-5 Units  0-5 Units Subcutaneous QHS Laverle Hobby, PA-C   3 Units at 07/12/14 2256  . insulin aspart (novoLOG) injection 4 Units  4 Units Subcutaneous TID WC Laverle Hobby, PA-C   4 Units at 07/15/14 1212  . insulin glargine (LANTUS) injection 50 Units  50 Units Subcutaneous Daily Venetia Maxon Rama, MD   50 Units at 07/15/14 [phone]  .  isosorbide mononitrate (IMDUR) 24 hr tablet 30 mg  30 mg Oral Daily Laverle Hobby, PA-C   30 mg at 07/15/14 8588  . lisinopril (PRINIVIL,ZESTRIL) tablet 20 mg  20 mg Oral Daily Laverle Hobby, PA-C   20 mg at 07/15/14 5027  . loratadine (CLARITIN) tablet 10 mg  10 mg Oral Daily Laverle Hobby, PA-C   10 mg at 07/15/14 0920  . LORazepam (ATIVAN) tablet 0.5 mg  0.5 mg Oral Q6H PRN Kerrie Buffalo, NP   0.5 mg at 07/15/14 0932  . magnesium hydroxide (MILK OF MAGNESIA) suspension 30 mL  30 mL Oral Daily PRN Laverle Hobby, PA-C      . metFORMIN (GLUCOPHAGE) tablet 500 mg  500 mg Oral Q breakfast Laverle Hobby, PA-C   500 mg at 07/15/14 7412  . mometasone-formoterol (DULERA) 100-5 MCG/ACT inhaler 2 puff  2 puff Inhalation BID Laverle Hobby, PA-C   2 puff at 07/15/14 0916  . nicotine (NICODERM CQ - dosed in mg/24 hours) patch 21 mg  21 mg Transdermal Daily Laverle Hobby, PA-C   21 mg at 07/15/14 0920  . pantoprazole (PROTONIX) EC tablet 40 mg  40 mg Oral Daily Laverle Hobby, PA-C   40 mg at 07/15/14 8786  . pregabalin (LYRICA) capsule 100 mg  100 mg Oral Q4H PRN Laverle Hobby, PA-C   100 mg at 07/15/14 1212  . rOPINIRole (REQUIP) tablet 0.25 mg  0.25 mg Oral QHS Laverle Hobby, PA-C   0.25 mg at 07/14/14 2150  . senna-docusate (Senokot-S) tablet 1 tablet  1 tablet Oral QHS PRN Laverle Hobby, PA-C      . traZODone (DESYREL) tablet 150 mg  150 mg Oral QHS Kerrie Buffalo, NP   150 mg at 07/14/14 2150    Lab Results:  Results for orders placed or performed during the hospital encounter of 07/12/14 (from the past 48 hour(s))  Glucose, capillary     Status: Abnormal   Collection Time: 07/13/14  5:11 PM  Result Value Ref Range   Glucose-Capillary 311 (H) 70 - 99 mg/dL   Comment  1 Notify RN    Comment 2 Document in Chart   Hepatic function panel     Status: Abnormal   Collection Time: 07/13/14 10:40 PM  Result Value Ref Range   Total Protein 7.2 6.0 - 8.3 g/dL   Albumin 3.4 (L) 3.5 -  5.2 g/dL   AST 40 (H) 0 - 37 U/L   ALT 38 (H) 0 - 35 U/L   Alkaline Phosphatase 89 39 - 117 U/L   Total Bilirubin 0.2 (L) 0.3 - 1.2 mg/dL   Bilirubin, Direct 0.2 0.0 - 0.5 mg/dL   Indirect Bilirubin 0.0 (L) 0.3 - 0.9 mg/dL  Lipase, blood     Status: None   Collection Time: 07/13/14 10:40 PM  Result Value Ref Range   Lipase 22 11 - 59 U/L  CBC     Status: Abnormal   Collection Time: 07/13/14 10:44 PM  Result Value Ref Range   WBC 7.4 4.0 - 10.5 K/uL   RBC 4.91 3.87 - 5.11 MIL/uL   Hemoglobin 13.8 12.0 - 15.0 g/dL   HCT 40.9 36.0 - 46.0 %   MCV 83.3 78.0 - 100.0 fL   MCH 28.1 26.0 - 34.0 pg   MCHC 33.7 30.0 - 36.0 g/dL   RDW 13.9 11.5 - 15.5 %   Platelets 146 (L) 150 - 400 K/uL  Basic metabolic panel     Status: Abnormal   Collection Time: 07/13/14 10:44 PM  Result Value Ref Range   Sodium 135 135 - 145 mmol/L   Potassium 4.4 3.5 - 5.1 mmol/L   Chloride 98 96 - 112 mmol/L   CO2 27 19 - 32 mmol/L   Glucose, Bld 167 (H) 70 - 99 mg/dL   BUN 17 6 - 23 mg/dL   Creatinine, Ser 0.90 0.50 - 1.10 mg/dL   Calcium 10.1 8.4 - 10.5 mg/dL   GFR calc non Af Amer 71 (L) >90 mL/min   GFR calc Af Amer 83 (L) >90 mL/min    Comment: (NOTE) The eGFR has been calculated using the CKD EPI equation. This calculation has not been validated in all clinical situations. eGFR's persistently <90 mL/min signify possible Chronic Kidney Disease.    Anion gap 10 5 - 15  I-stat troponin, ED (not at [facility])     Status: None   Collection Time: 07/13/14 10:48 PM  Result Value Ref Range   Troponin i, poc 0.00 0.00 - 0.08 ng/mL   Comment 3            Comment: Due to the release kinetics of cTnI, a negative result within the first hours of the onset of symptoms does not rule out myocardial infarction with certainty. If myocardial infarction is still suspected, repeat the test at appropriate intervals.   CBG monitoring, ED     Status: Abnormal   Collection Time: 07/13/14 11:52 PM  Result Value Ref Range    Glucose-Capillary 160 (H) 70 - 99 mg/dL  Urinalysis, Routine w reflex microscopic     Status: Abnormal   Collection Time: 07/14/14  3:27 AM  Result Value Ref Range   Color, Urine YELLOW YELLOW   APPearance CLOUDY (A) CLEAR   Specific Gravity, Urine 1.005 1.005 - 1.030   pH 7.0 5.0 - 8.0   Glucose, UA NEGATIVE NEGATIVE mg/dL   Hgb urine dipstick NEGATIVE NEGATIVE   Bilirubin Urine NEGATIVE NEGATIVE   Ketones, ur NEGATIVE NEGATIVE mg/dL   Protein, ur NEGATIVE NEGATIVE mg/dL   Urobilinogen, UA 0.2 0.0 -  1.0 mg/dL   Nitrite NEGATIVE NEGATIVE   Leukocytes, UA NEGATIVE NEGATIVE    Comment: MICROSCOPIC NOT DONE ON URINES WITH NEGATIVE PROTEIN, BLOOD, LEUKOCYTES, NITRITE, OR GLUCOSE <1000 mg/dL.  Drug screen panel, emergency     Status: None   Collection Time: 07/14/14  3:28 AM  Result Value Ref Range   Opiates NONE DETECTED NONE DETECTED   Cocaine NONE DETECTED NONE DETECTED   Benzodiazepines NONE DETECTED NONE DETECTED   Amphetamines NONE DETECTED NONE DETECTED   Tetrahydrocannabinol NONE DETECTED NONE DETECTED   Barbiturates NONE DETECTED NONE DETECTED    Comment:        DRUG SCREEN FOR MEDICAL PURPOSES ONLY.  IF CONFIRMATION IS NEEDED FOR ANY PURPOSE, NOTIFY LAB WITHIN 5 DAYS.        LOWEST DETECTABLE LIMITS FOR URINE DRUG SCREEN Drug Class       Cutoff (ng/mL) Amphetamine      1000 Barbiturate      200 Benzodiazepine   209 Tricyclics       470 Opiates          300 Cocaine          300 THC              50   Glucose, capillary     Status: Abnormal   Collection Time: 07/14/14  6:28 AM  Result Value Ref Range   Glucose-Capillary 184 (H) 70 - 99 mg/dL  Glucose, capillary     Status: Abnormal   Collection Time: 07/14/14 11:29 AM  Result Value Ref Range   Glucose-Capillary 178 (H) 70 - 99 mg/dL   Comment 1 Notify RN    Comment 2 Document in Chart   Glucose, capillary     Status: Abnormal   Collection Time: 07/14/14  4:30 PM  Result Value Ref Range   Glucose-Capillary 164  (H) 70 - 99 mg/dL   Comment 1 Notify RN    Comment 2 Document in Chart   Glucose, capillary     Status: Abnormal   Collection Time: 07/14/14  8:03 PM  Result Value Ref Range   Glucose-Capillary 144 (H) 70 - 99 mg/dL  Glucose, capillary     Status: Abnormal   Collection Time: 07/15/14  6:30 AM  Result Value Ref Range   Glucose-Capillary 142 (H) 70 - 99 mg/dL  Glucose, capillary     Status: Abnormal   Collection Time: 07/15/14 11:39 AM  Result Value Ref Range   Glucose-Capillary 191 (H) 70 - 99 mg/dL   Comment 1 Notify RN    Comment 2 Document in Chart     Physical Findings: AIMS:  , ,  ,  ,    CIWA:    COWS:      Assessment- slow, gradual improvement, but compared to admission status, less irritable, less severely dysphoric, tolerating medications well, much improvement of hand cellulitis, and now more invested in  Substance abuse treatment, wanting to go to  A Rehab Setting after discharge if possible.  Treatment Plan Summary: Daily contact with patient to assess and evaluate symptoms and progress in treatment, Medication management, Plan ongoing inpatient admission and medication management as below  Neurontin 800 mgrds TID- for pain/anxiety Increase Cymbalta  To 40  mgrs QDAY - for depression  Requip 0.25 mgrs QHS for restless legs Trazodone for insomnia Now on Doxycycline for cellulitis.     Medical Decision Making:  Established Problem, Stable/Improving (1), Review of Psycho-Social Stressors (1), Review or order clinical lab tests (  1) and Review of Medication Regimen & Side Effects (2)     Yasmin Bronaugh 07/15/2014, 2:20 PM

## 2014-07-15 NOTE — Progress Notes (Signed)
Patient ID: Shirley SimmondsDonna L Short, female   DOB: 12/15/1960, 54 y.o.   MRN: [id number]  Pt. Denies SI/HI and A/V hallucinations. Belongings returned to patient at time of discharge. Patient denies any new onset of pain or discomfort. Discharge instructions and medications were reviewed with patient. Patient verbalized understanding of both medications and discharge instructions. Patient received bus pass and petty cash to [facility] bus. Q15 minute safety checks maintained until discharge. No distress upon discharge.

## 2014-07-15 NOTE — Plan of Care (Signed)
Problem: Diagnosis: Increased Risk For Suicide Attempt Goal: STG-Patient Will Comply With Medication Regime Outcome: Progressing Patient compliant with medication regime however reports Lantus is given at night. MD [place]os notifed.

## 2014-07-16 NOTE — ED Notes (Signed)
 1mg  of Ativan wasted in sharps. Erica B. Witnessed waste.

## 2014-07-19 NOTE — Progress Notes (Signed)
Patient Discharge Instructions:  After Visit Summary (AVS):   Faxed to:  07/19/14 Discharge Summary Note:   Faxed to:  07/19/14 Psychiatric Admission Assessment Note:   Faxed to:  07/19/14 Suicide Risk Assessment - Discharge Assessment:   Faxed to:  07/19/14 Faxed/Sent to the Next Level Care provider:  07/19/14 Next Level Care Provider Has Access to the EMR, 07/19/14  Faxed to [facility] @ [phone] [facility] via CH[facility]/Epic access.   Jerelene ReddenSheena E [place], 07/19/2014, 3:36 PM

## 2014-07-26 ENCOUNTER — Encounter: Payer: Self-pay | Admitting: Cardiology

## 2014-07-26 NOTE — Progress Notes (Signed)
     Clinical Summary Ms. Shirley Short is a 54 y.o.female ERROR No Show

## 2016-11-09 IMAGING — DX DG CHEST 1V PORT
1 series · 1 of 1 positions shown · non-contrast
Comparison: 05/23/2014; 02/15/2013

CLINICAL DATA: History of smoking, recent myocardial infarction,
hypertension and CHF.

EXAM:
PORTABLE CHEST - 1 VIEW

[chest ap]
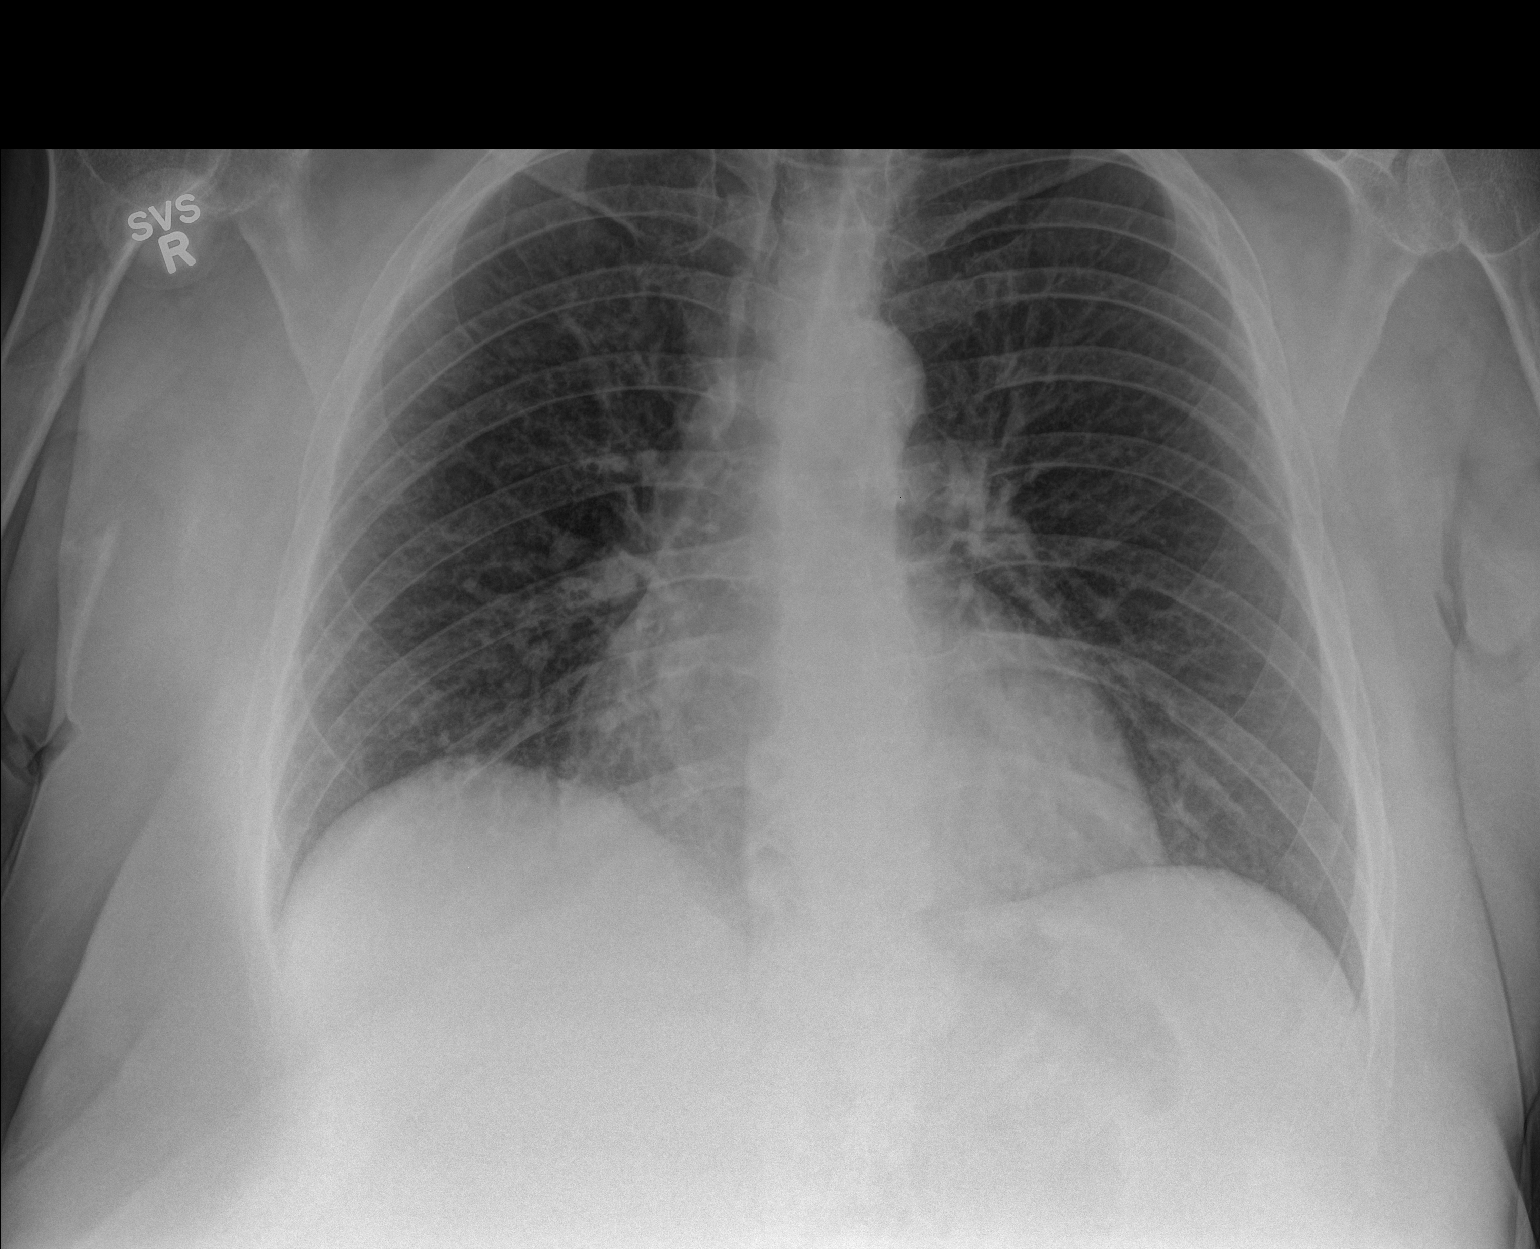

[1 of 1 positions shown; findings below may reference images not displayed]

FINDINGS: Grossly unchanged cardiac silhouette and mediastinal contours given
persistently reduced lung volumes. No focal airspace opacities. No
pleural effusion or pneumothorax. No evidence of edema. No acute
osseus abnormalities.
IMPRESSION: No definite acute cardiopulmonary disease on this AP portable
examination.

## 2017-07-23 DEATH — deceased
